# Patient Record
Sex: Female | Born: 1945
Health system: Southern US, Community
[De-identification: ages and names within clinical notes are randomized; demographics above are authoritative.]

## PROBLEM LIST (undated history)

## (undated) DIAGNOSIS — M1990 Unspecified osteoarthritis, unspecified site: Secondary | ICD-10-CM

## (undated) DIAGNOSIS — G8929 Other chronic pain: Secondary | ICD-10-CM

## (undated) DIAGNOSIS — Z860100 Personal history of colon polyps, unspecified: Secondary | ICD-10-CM

## (undated) DIAGNOSIS — T8859XA Other complications of anesthesia, initial encounter: Secondary | ICD-10-CM

## (undated) DIAGNOSIS — I10 Essential (primary) hypertension: Secondary | ICD-10-CM

## (undated) DIAGNOSIS — N200 Calculus of kidney: Secondary | ICD-10-CM

## (undated) DIAGNOSIS — R112 Nausea with vomiting, unspecified: Secondary | ICD-10-CM

## (undated) DIAGNOSIS — Z9889 Other specified postprocedural states: Secondary | ICD-10-CM

## (undated) DIAGNOSIS — F329 Major depressive disorder, single episode, unspecified: Secondary | ICD-10-CM

## (undated) DIAGNOSIS — Z87442 Personal history of urinary calculi: Secondary | ICD-10-CM

## (undated) DIAGNOSIS — R200 Anesthesia of skin: Secondary | ICD-10-CM

## (undated) DIAGNOSIS — H269 Unspecified cataract: Secondary | ICD-10-CM

## (undated) DIAGNOSIS — M19019 Primary osteoarthritis, unspecified shoulder: Secondary | ICD-10-CM

## (undated) DIAGNOSIS — E039 Hypothyroidism, unspecified: Secondary | ICD-10-CM

## (undated) DIAGNOSIS — T4145XA Adverse effect of unspecified anesthetic, initial encounter: Secondary | ICD-10-CM

## (undated) DIAGNOSIS — E785 Hyperlipidemia, unspecified: Secondary | ICD-10-CM

## (undated) DIAGNOSIS — M549 Dorsalgia, unspecified: Secondary | ICD-10-CM

## (undated) DIAGNOSIS — K5900 Constipation, unspecified: Secondary | ICD-10-CM

## (undated) DIAGNOSIS — F32A Depression, unspecified: Secondary | ICD-10-CM

## (undated) HISTORY — PX: BRONCHOSCOPY: SUR163

## (undated) HISTORY — PX: LUNG BIOPSY: SHX232

## (undated) HISTORY — DX: Calculus of kidney: N20.0

## (undated) HISTORY — DX: Unspecified osteoarthritis, unspecified site: M19.90

## (undated) HISTORY — PX: ABDOMINAL HYSTERECTOMY: SHX81

## (undated) HISTORY — PX: BACK SURGERY: SHX140

---

## 1983-02-22 HISTORY — PX: TUBAL LIGATION: SHX77

## 2002-04-23 HISTORY — PX: SHOULDER SURGERY: SHX246

## 2010-04-23 HISTORY — PX: ABDOMINAL HYSTERECTOMY: SHX81

## 2010-07-13 ENCOUNTER — Other Ambulatory Visit (HOSPITAL_COMMUNITY): Payer: Self-pay | Admitting: Urology

## 2010-07-13 ENCOUNTER — Other Ambulatory Visit: Payer: Self-pay | Admitting: Family Medicine

## 2010-07-13 DIAGNOSIS — K225 Diverticulum of esophagus, acquired: Secondary | ICD-10-CM

## 2010-07-13 DIAGNOSIS — N361 Urethral diverticulum: Secondary | ICD-10-CM

## 2010-07-18 ENCOUNTER — Ambulatory Visit (HOSPITAL_COMMUNITY)
Admission: RE | Admit: 2010-07-18 | Discharge: 2010-07-18 | Disposition: A | Discharge status: Home / Self Care | Payer: BC Managed Care – PPO | Source: Ambulatory Visit | Attending: Urology | Admitting: Urology

## 2010-07-18 DIAGNOSIS — IMO0002 Reserved for concepts with insufficient information to code with codable children: Secondary | ICD-10-CM | POA: Insufficient documentation

## 2010-07-18 DIAGNOSIS — N361 Urethral diverticulum: Secondary | ICD-10-CM

## 2011-01-24 ENCOUNTER — Ambulatory Visit (HOSPITAL_COMMUNITY): Admission: RE | Admit: 2011-01-24 | Payer: Self-pay | Source: Ambulatory Visit | Admitting: Obstetrics & Gynecology

## 2011-01-24 ENCOUNTER — Encounter (HOSPITAL_COMMUNITY): Admission: RE | Payer: Self-pay | Source: Ambulatory Visit

## 2011-01-24 SURGERY — HYSTERECTOMY, VAGINAL, LAPAROSCOPY-ASSISTED
Anesthesia: General

## 2011-09-27 ENCOUNTER — Other Ambulatory Visit: Payer: Self-pay | Admitting: Physician Assistant

## 2012-04-23 HISTORY — PX: COLONOSCOPY: SHX174

## 2013-04-01 DIAGNOSIS — I10 Essential (primary) hypertension: Secondary | ICD-10-CM | POA: Diagnosis not present

## 2013-04-01 DIAGNOSIS — E785 Hyperlipidemia, unspecified: Secondary | ICD-10-CM | POA: Diagnosis not present

## 2013-04-01 DIAGNOSIS — E559 Vitamin D deficiency, unspecified: Secondary | ICD-10-CM | POA: Diagnosis not present

## 2013-04-24 DIAGNOSIS — M545 Low back pain, unspecified: Secondary | ICD-10-CM | POA: Diagnosis not present

## 2013-04-24 DIAGNOSIS — M5137 Other intervertebral disc degeneration, lumbosacral region: Secondary | ICD-10-CM | POA: Diagnosis not present

## 2013-04-24 DIAGNOSIS — M79609 Pain in unspecified limb: Secondary | ICD-10-CM | POA: Diagnosis not present

## 2013-05-13 DIAGNOSIS — M4310 Spondylolisthesis, site unspecified: Secondary | ICD-10-CM | POA: Diagnosis not present

## 2013-05-13 DIAGNOSIS — G8929 Other chronic pain: Secondary | ICD-10-CM | POA: Diagnosis not present

## 2013-05-13 DIAGNOSIS — M5137 Other intervertebral disc degeneration, lumbosacral region: Secondary | ICD-10-CM | POA: Diagnosis not present

## 2013-05-13 DIAGNOSIS — M79609 Pain in unspecified limb: Secondary | ICD-10-CM | POA: Diagnosis not present

## 2013-05-26 DIAGNOSIS — J069 Acute upper respiratory infection, unspecified: Secondary | ICD-10-CM | POA: Diagnosis not present

## 2013-07-07 DIAGNOSIS — E039 Hypothyroidism, unspecified: Secondary | ICD-10-CM | POA: Diagnosis not present

## 2013-07-07 DIAGNOSIS — I10 Essential (primary) hypertension: Secondary | ICD-10-CM | POA: Diagnosis not present

## 2013-07-07 DIAGNOSIS — E038 Other specified hypothyroidism: Secondary | ICD-10-CM | POA: Diagnosis not present

## 2013-07-07 DIAGNOSIS — E559 Vitamin D deficiency, unspecified: Secondary | ICD-10-CM | POA: Diagnosis not present

## 2013-07-07 DIAGNOSIS — E785 Hyperlipidemia, unspecified: Secondary | ICD-10-CM | POA: Diagnosis not present

## 2013-09-15 DIAGNOSIS — R5381 Other malaise: Secondary | ICD-10-CM | POA: Diagnosis not present

## 2013-09-15 DIAGNOSIS — E039 Hypothyroidism, unspecified: Secondary | ICD-10-CM | POA: Diagnosis not present

## 2013-09-15 DIAGNOSIS — R5383 Other fatigue: Secondary | ICD-10-CM | POA: Diagnosis not present

## 2013-09-15 DIAGNOSIS — M2550 Pain in unspecified joint: Secondary | ICD-10-CM | POA: Diagnosis not present

## 2013-10-26 DIAGNOSIS — I10 Essential (primary) hypertension: Secondary | ICD-10-CM | POA: Diagnosis not present

## 2013-10-26 DIAGNOSIS — E559 Vitamin D deficiency, unspecified: Secondary | ICD-10-CM | POA: Diagnosis not present

## 2013-10-26 DIAGNOSIS — Z1231 Encounter for screening mammogram for malignant neoplasm of breast: Secondary | ICD-10-CM | POA: Diagnosis not present

## 2013-10-26 DIAGNOSIS — E038 Other specified hypothyroidism: Secondary | ICD-10-CM | POA: Diagnosis not present

## 2013-10-26 DIAGNOSIS — E785 Hyperlipidemia, unspecified: Secondary | ICD-10-CM | POA: Diagnosis not present

## 2013-10-26 DIAGNOSIS — Z79899 Other long term (current) drug therapy: Secondary | ICD-10-CM | POA: Diagnosis not present

## 2013-11-18 DIAGNOSIS — E039 Hypothyroidism, unspecified: Secondary | ICD-10-CM | POA: Diagnosis not present

## 2013-11-18 DIAGNOSIS — R0789 Other chest pain: Secondary | ICD-10-CM | POA: Diagnosis not present

## 2013-11-18 DIAGNOSIS — R079 Chest pain, unspecified: Secondary | ICD-10-CM | POA: Diagnosis not present

## 2013-11-18 DIAGNOSIS — E785 Hyperlipidemia, unspecified: Secondary | ICD-10-CM | POA: Diagnosis not present

## 2013-11-18 DIAGNOSIS — I10 Essential (primary) hypertension: Secondary | ICD-10-CM | POA: Diagnosis not present

## 2013-11-23 DIAGNOSIS — R1011 Right upper quadrant pain: Secondary | ICD-10-CM | POA: Diagnosis not present

## 2013-12-07 DIAGNOSIS — E785 Hyperlipidemia, unspecified: Secondary | ICD-10-CM | POA: Diagnosis not present

## 2013-12-07 DIAGNOSIS — I10 Essential (primary) hypertension: Secondary | ICD-10-CM | POA: Diagnosis not present

## 2013-12-07 DIAGNOSIS — R609 Edema, unspecified: Secondary | ICD-10-CM | POA: Diagnosis not present

## 2013-12-07 DIAGNOSIS — R079 Chest pain, unspecified: Secondary | ICD-10-CM | POA: Diagnosis not present

## 2013-12-16 DIAGNOSIS — Z1231 Encounter for screening mammogram for malignant neoplasm of breast: Secondary | ICD-10-CM | POA: Diagnosis not present

## 2014-02-03 DIAGNOSIS — I10 Essential (primary) hypertension: Secondary | ICD-10-CM | POA: Diagnosis not present

## 2014-02-03 DIAGNOSIS — E782 Mixed hyperlipidemia: Secondary | ICD-10-CM | POA: Diagnosis not present

## 2014-02-03 DIAGNOSIS — Z23 Encounter for immunization: Secondary | ICD-10-CM | POA: Diagnosis not present

## 2014-02-03 DIAGNOSIS — E559 Vitamin D deficiency, unspecified: Secondary | ICD-10-CM | POA: Diagnosis not present

## 2014-02-03 DIAGNOSIS — E039 Hypothyroidism, unspecified: Secondary | ICD-10-CM | POA: Diagnosis not present

## 2014-02-03 DIAGNOSIS — M2550 Pain in unspecified joint: Secondary | ICD-10-CM | POA: Diagnosis not present

## 2014-02-24 DIAGNOSIS — Z23 Encounter for immunization: Secondary | ICD-10-CM | POA: Diagnosis not present

## 2014-03-10 DIAGNOSIS — M545 Low back pain: Secondary | ICD-10-CM | POA: Diagnosis not present

## 2014-03-16 DIAGNOSIS — M5136 Other intervertebral disc degeneration, lumbar region: Secondary | ICD-10-CM | POA: Diagnosis not present

## 2014-03-16 DIAGNOSIS — L608 Other nail disorders: Secondary | ICD-10-CM | POA: Diagnosis not present

## 2014-03-16 DIAGNOSIS — M2550 Pain in unspecified joint: Secondary | ICD-10-CM | POA: Diagnosis not present

## 2014-03-16 DIAGNOSIS — M150 Primary generalized (osteo)arthritis: Secondary | ICD-10-CM | POA: Diagnosis not present

## 2014-04-08 DIAGNOSIS — M5136 Other intervertebral disc degeneration, lumbar region: Secondary | ICD-10-CM | POA: Diagnosis not present

## 2014-04-08 DIAGNOSIS — M2550 Pain in unspecified joint: Secondary | ICD-10-CM | POA: Diagnosis not present

## 2014-04-08 DIAGNOSIS — L608 Other nail disorders: Secondary | ICD-10-CM | POA: Diagnosis not present

## 2014-04-08 DIAGNOSIS — M150 Primary generalized (osteo)arthritis: Secondary | ICD-10-CM | POA: Diagnosis not present

## 2014-05-04 DIAGNOSIS — M19011 Primary osteoarthritis, right shoulder: Secondary | ICD-10-CM | POA: Diagnosis not present

## 2014-05-04 DIAGNOSIS — M25511 Pain in right shoulder: Secondary | ICD-10-CM | POA: Diagnosis not present

## 2014-05-05 DIAGNOSIS — S39012D Strain of muscle, fascia and tendon of lower back, subsequent encounter: Secondary | ICD-10-CM | POA: Diagnosis not present

## 2014-05-17 DIAGNOSIS — S39012D Strain of muscle, fascia and tendon of lower back, subsequent encounter: Secondary | ICD-10-CM | POA: Diagnosis not present

## 2014-05-19 DIAGNOSIS — S39012D Strain of muscle, fascia and tendon of lower back, subsequent encounter: Secondary | ICD-10-CM | POA: Diagnosis not present

## 2014-05-31 DIAGNOSIS — I10 Essential (primary) hypertension: Secondary | ICD-10-CM | POA: Diagnosis not present

## 2014-05-31 DIAGNOSIS — E559 Vitamin D deficiency, unspecified: Secondary | ICD-10-CM | POA: Diagnosis not present

## 2014-05-31 DIAGNOSIS — E039 Hypothyroidism, unspecified: Secondary | ICD-10-CM | POA: Diagnosis not present

## 2014-05-31 DIAGNOSIS — E782 Mixed hyperlipidemia: Secondary | ICD-10-CM | POA: Diagnosis not present

## 2014-06-01 DIAGNOSIS — E782 Mixed hyperlipidemia: Secondary | ICD-10-CM | POA: Diagnosis not present

## 2014-06-01 DIAGNOSIS — M150 Primary generalized (osteo)arthritis: Secondary | ICD-10-CM | POA: Diagnosis not present

## 2014-06-01 DIAGNOSIS — E039 Hypothyroidism, unspecified: Secondary | ICD-10-CM | POA: Diagnosis not present

## 2014-06-01 DIAGNOSIS — I10 Essential (primary) hypertension: Secondary | ICD-10-CM | POA: Diagnosis not present

## 2014-06-07 DIAGNOSIS — S39012D Strain of muscle, fascia and tendon of lower back, subsequent encounter: Secondary | ICD-10-CM | POA: Diagnosis not present

## 2014-06-09 DIAGNOSIS — S39012D Strain of muscle, fascia and tendon of lower back, subsequent encounter: Secondary | ICD-10-CM | POA: Diagnosis not present

## 2014-06-14 DIAGNOSIS — S39012D Strain of muscle, fascia and tendon of lower back, subsequent encounter: Secondary | ICD-10-CM | POA: Diagnosis not present

## 2014-08-06 DIAGNOSIS — M25512 Pain in left shoulder: Secondary | ICD-10-CM | POA: Diagnosis not present

## 2014-08-06 DIAGNOSIS — M19012 Primary osteoarthritis, left shoulder: Secondary | ICD-10-CM | POA: Diagnosis not present

## 2014-09-04 DIAGNOSIS — J029 Acute pharyngitis, unspecified: Secondary | ICD-10-CM | POA: Diagnosis not present

## 2014-09-04 DIAGNOSIS — R062 Wheezing: Secondary | ICD-10-CM | POA: Diagnosis not present

## 2014-09-04 DIAGNOSIS — J069 Acute upper respiratory infection, unspecified: Secondary | ICD-10-CM | POA: Diagnosis not present

## 2014-09-23 DIAGNOSIS — R1310 Dysphagia, unspecified: Secondary | ICD-10-CM | POA: Diagnosis not present

## 2014-09-23 DIAGNOSIS — R32 Unspecified urinary incontinence: Secondary | ICD-10-CM | POA: Diagnosis not present

## 2014-09-23 DIAGNOSIS — R252 Cramp and spasm: Secondary | ICD-10-CM | POA: Diagnosis not present

## 2014-09-23 DIAGNOSIS — Z780 Asymptomatic menopausal state: Secondary | ICD-10-CM | POA: Diagnosis not present

## 2014-09-23 DIAGNOSIS — E559 Vitamin D deficiency, unspecified: Secondary | ICD-10-CM | POA: Diagnosis not present

## 2014-09-23 DIAGNOSIS — R05 Cough: Secondary | ICD-10-CM | POA: Diagnosis not present

## 2014-09-23 DIAGNOSIS — N182 Chronic kidney disease, stage 2 (mild): Secondary | ICD-10-CM | POA: Diagnosis not present

## 2014-09-23 DIAGNOSIS — R232 Flushing: Secondary | ICD-10-CM | POA: Diagnosis not present

## 2014-09-23 DIAGNOSIS — I129 Hypertensive chronic kidney disease with stage 1 through stage 4 chronic kidney disease, or unspecified chronic kidney disease: Secondary | ICD-10-CM | POA: Diagnosis not present

## 2014-09-23 DIAGNOSIS — M2550 Pain in unspecified joint: Secondary | ICD-10-CM | POA: Diagnosis not present

## 2014-09-23 DIAGNOSIS — E039 Hypothyroidism, unspecified: Secondary | ICD-10-CM | POA: Diagnosis not present

## 2014-09-23 DIAGNOSIS — Z79899 Other long term (current) drug therapy: Secondary | ICD-10-CM | POA: Diagnosis not present

## 2014-09-23 DIAGNOSIS — E785 Hyperlipidemia, unspecified: Secondary | ICD-10-CM | POA: Diagnosis not present

## 2014-09-27 DIAGNOSIS — M25511 Pain in right shoulder: Secondary | ICD-10-CM | POA: Diagnosis not present

## 2014-09-27 DIAGNOSIS — M19011 Primary osteoarthritis, right shoulder: Secondary | ICD-10-CM | POA: Diagnosis not present

## 2014-10-12 DIAGNOSIS — R1310 Dysphagia, unspecified: Secondary | ICD-10-CM | POA: Diagnosis not present

## 2014-10-20 ENCOUNTER — Ambulatory Visit: Payer: Self-pay | Admitting: Podiatry

## 2014-10-20 ENCOUNTER — Ambulatory Visit (INDEPENDENT_AMBULATORY_CARE_PROVIDER_SITE_OTHER): Payer: Medicare Other | Admitting: Podiatry

## 2014-10-20 ENCOUNTER — Encounter: Payer: Self-pay | Admitting: Podiatry

## 2014-10-20 VITALS — BP 140/88 | HR 76 | Resp 18

## 2014-10-20 DIAGNOSIS — M79676 Pain in unspecified toe(s): Secondary | ICD-10-CM

## 2014-10-20 DIAGNOSIS — B351 Tinea unguium: Secondary | ICD-10-CM | POA: Diagnosis not present

## 2014-10-20 DIAGNOSIS — M79674 Pain in right toe(s): Secondary | ICD-10-CM | POA: Diagnosis not present

## 2014-10-20 DIAGNOSIS — M79675 Pain in left toe(s): Secondary | ICD-10-CM

## 2014-10-20 NOTE — Progress Notes (Signed)
   Subjective:    Patient ID: [name], female    DOB: Apr 13, 1946, 69 y.o.   MRN: [id number]  HPI I HAVE AN INGROWN TOENAIL ON MY RIGHT BIG TOE AND IT IS SORE AND TENDER AND HAS TROUBLE CUTTING IT AND IS PAINFUL WHEN IT FLARES UP AND THERE IS NO DRAINING AND MY FEET HAVE ARTHRITIS IN THEM AND FEELS LIKE A BONE IS BROKEN IN THEM This patient presents saying she has pain on both borders right big toe.  She says she experiences pain walking and wearing her shoes.She says no evidence of redness or drainage.  She presents for evaluation and treatment.   Review of Systems  Constitutional:       SWEATING   Musculoskeletal: Positive for back pain and gait problem.       JOINT PAIN  All other systems reviewed and are negative.      Objective:   Physical Exam Objective: Review of past medical history, medications, social history and allergies were performed.  Vascular: Dorsalis pedis and posterior tibial pulses were palpable B/L, capillary refill was  WNL B/L, temperature gradient was WNL B/L   Skin:  No signs of symptoms of infection or ulcers on both feet  Nails: Thick disfigure discolored great nails both feet.  Pincer toenails noted.  Sensory: Thornell Mule monifilament WNL   Orthopedic: Orthopedic evaluation demonstrates all joints distal t ankle have full ROM without crepitus, muscle power WNL B/L.  Severe HAV deformity both feet with overlapping second toe both feet.       Assessment & Plan:  Onychomycosis Hallux B/L  IE   Debridement of hallux nails both feet

## 2015-01-04 DIAGNOSIS — M25512 Pain in left shoulder: Secondary | ICD-10-CM | POA: Diagnosis not present

## 2015-01-04 DIAGNOSIS — M19012 Primary osteoarthritis, left shoulder: Secondary | ICD-10-CM | POA: Diagnosis not present

## 2015-01-06 DIAGNOSIS — E6609 Other obesity due to excess calories: Secondary | ICD-10-CM | POA: Diagnosis not present

## 2015-01-06 DIAGNOSIS — Z0000 Encounter for general adult medical examination without abnormal findings: Secondary | ICD-10-CM | POA: Diagnosis not present

## 2015-01-06 DIAGNOSIS — E559 Vitamin D deficiency, unspecified: Secondary | ICD-10-CM | POA: Diagnosis not present

## 2015-01-06 DIAGNOSIS — R5383 Other fatigue: Secondary | ICD-10-CM | POA: Diagnosis not present

## 2015-01-06 DIAGNOSIS — Z1231 Encounter for screening mammogram for malignant neoplasm of breast: Secondary | ICD-10-CM | POA: Diagnosis not present

## 2015-01-06 DIAGNOSIS — Z136 Encounter for screening for cardiovascular disorders: Secondary | ICD-10-CM | POA: Diagnosis not present

## 2015-01-06 DIAGNOSIS — N182 Chronic kidney disease, stage 2 (mild): Secondary | ICD-10-CM | POA: Diagnosis not present

## 2015-01-06 DIAGNOSIS — E785 Hyperlipidemia, unspecified: Secondary | ICD-10-CM | POA: Diagnosis not present

## 2015-01-06 DIAGNOSIS — I129 Hypertensive chronic kidney disease with stage 1 through stage 4 chronic kidney disease, or unspecified chronic kidney disease: Secondary | ICD-10-CM | POA: Diagnosis not present

## 2015-01-06 DIAGNOSIS — M2550 Pain in unspecified joint: Secondary | ICD-10-CM | POA: Diagnosis not present

## 2015-01-06 DIAGNOSIS — Z1211 Encounter for screening for malignant neoplasm of colon: Secondary | ICD-10-CM | POA: Diagnosis not present

## 2015-01-06 DIAGNOSIS — Z79899 Other long term (current) drug therapy: Secondary | ICD-10-CM | POA: Diagnosis not present

## 2015-01-06 DIAGNOSIS — Z6830 Body mass index (BMI) 30.0-30.9, adult: Secondary | ICD-10-CM | POA: Diagnosis not present

## 2015-01-18 DIAGNOSIS — R32 Unspecified urinary incontinence: Secondary | ICD-10-CM | POA: Diagnosis not present

## 2015-01-18 DIAGNOSIS — R232 Flushing: Secondary | ICD-10-CM | POA: Diagnosis not present

## 2015-01-18 DIAGNOSIS — Z23 Encounter for immunization: Secondary | ICD-10-CM | POA: Diagnosis not present

## 2015-01-18 DIAGNOSIS — N368 Other specified disorders of urethra: Secondary | ICD-10-CM | POA: Diagnosis not present

## 2015-01-18 DIAGNOSIS — Z780 Asymptomatic menopausal state: Secondary | ICD-10-CM | POA: Diagnosis not present

## 2015-01-18 DIAGNOSIS — D729 Disorder of white blood cells, unspecified: Secondary | ICD-10-CM | POA: Diagnosis not present

## 2015-01-25 DIAGNOSIS — Z1211 Encounter for screening for malignant neoplasm of colon: Secondary | ICD-10-CM | POA: Diagnosis not present

## 2015-02-02 DIAGNOSIS — Z1231 Encounter for screening mammogram for malignant neoplasm of breast: Secondary | ICD-10-CM | POA: Diagnosis not present

## 2015-02-02 DIAGNOSIS — Z13820 Encounter for screening for osteoporosis: Secondary | ICD-10-CM | POA: Diagnosis not present

## 2015-02-02 DIAGNOSIS — Z780 Asymptomatic menopausal state: Secondary | ICD-10-CM | POA: Diagnosis not present

## 2015-02-07 DIAGNOSIS — R3989 Other symptoms and signs involving the genitourinary system: Secondary | ICD-10-CM | POA: Diagnosis not present

## 2015-02-07 DIAGNOSIS — R102 Pelvic and perineal pain: Secondary | ICD-10-CM | POA: Diagnosis not present

## 2015-02-07 DIAGNOSIS — N952 Postmenopausal atrophic vaginitis: Secondary | ICD-10-CM | POA: Diagnosis not present

## 2015-02-08 DIAGNOSIS — N182 Chronic kidney disease, stage 2 (mild): Secondary | ICD-10-CM | POA: Diagnosis not present

## 2015-02-08 DIAGNOSIS — I129 Hypertensive chronic kidney disease with stage 1 through stage 4 chronic kidney disease, or unspecified chronic kidney disease: Secondary | ICD-10-CM | POA: Diagnosis not present

## 2015-02-08 DIAGNOSIS — Z79899 Other long term (current) drug therapy: Secondary | ICD-10-CM | POA: Diagnosis not present

## 2015-02-28 DIAGNOSIS — R3989 Other symptoms and signs involving the genitourinary system: Secondary | ICD-10-CM | POA: Diagnosis not present

## 2015-02-28 DIAGNOSIS — N952 Postmenopausal atrophic vaginitis: Secondary | ICD-10-CM | POA: Diagnosis not present

## 2015-03-07 DIAGNOSIS — M19011 Primary osteoarthritis, right shoulder: Secondary | ICD-10-CM | POA: Diagnosis not present

## 2015-03-07 DIAGNOSIS — M7540 Impingement syndrome of unspecified shoulder: Secondary | ICD-10-CM | POA: Diagnosis not present

## 2015-06-13 DIAGNOSIS — M19011 Primary osteoarthritis, right shoulder: Secondary | ICD-10-CM | POA: Diagnosis not present

## 2015-06-14 DIAGNOSIS — M5090 Cervical disc disorder, unspecified, unspecified cervical region: Secondary | ICD-10-CM | POA: Diagnosis not present

## 2015-06-14 DIAGNOSIS — M545 Low back pain: Secondary | ICD-10-CM | POA: Diagnosis not present

## 2015-06-20 DIAGNOSIS — M7581 Other shoulder lesions, right shoulder: Secondary | ICD-10-CM | POA: Diagnosis not present

## 2015-06-20 DIAGNOSIS — M50221 Other cervical disc displacement at C4-C5 level: Secondary | ICD-10-CM | POA: Diagnosis not present

## 2015-06-20 DIAGNOSIS — M5030 Other cervical disc degeneration, unspecified cervical region: Secondary | ICD-10-CM | POA: Diagnosis not present

## 2015-06-20 DIAGNOSIS — M50222 Other cervical disc displacement at C5-C6 level: Secondary | ICD-10-CM | POA: Diagnosis not present

## 2015-06-20 DIAGNOSIS — M47892 Other spondylosis, cervical region: Secondary | ICD-10-CM | POA: Diagnosis not present

## 2015-06-20 DIAGNOSIS — M47812 Spondylosis without myelopathy or radiculopathy, cervical region: Secondary | ICD-10-CM | POA: Diagnosis not present

## 2015-06-20 DIAGNOSIS — M4802 Spinal stenosis, cervical region: Secondary | ICD-10-CM | POA: Diagnosis not present

## 2015-06-20 DIAGNOSIS — M19011 Primary osteoarthritis, right shoulder: Secondary | ICD-10-CM | POA: Diagnosis not present

## 2015-06-21 DIAGNOSIS — M5090 Cervical disc disorder, unspecified, unspecified cervical region: Secondary | ICD-10-CM | POA: Diagnosis not present

## 2015-08-17 DIAGNOSIS — M546 Pain in thoracic spine: Secondary | ICD-10-CM | POA: Diagnosis not present

## 2015-08-25 DIAGNOSIS — E785 Hyperlipidemia, unspecified: Secondary | ICD-10-CM | POA: Diagnosis not present

## 2015-08-25 DIAGNOSIS — Z1389 Encounter for screening for other disorder: Secondary | ICD-10-CM | POA: Diagnosis not present

## 2015-08-25 DIAGNOSIS — I10 Essential (primary) hypertension: Secondary | ICD-10-CM | POA: Diagnosis not present

## 2015-08-25 DIAGNOSIS — E039 Hypothyroidism, unspecified: Secondary | ICD-10-CM | POA: Diagnosis not present

## 2015-08-25 DIAGNOSIS — Z780 Asymptomatic menopausal state: Secondary | ICD-10-CM | POA: Diagnosis not present

## 2015-08-25 DIAGNOSIS — N941 Unspecified dyspareunia: Secondary | ICD-10-CM | POA: Diagnosis not present

## 2015-08-25 DIAGNOSIS — M1990 Unspecified osteoarthritis, unspecified site: Secondary | ICD-10-CM | POA: Diagnosis not present

## 2015-08-25 DIAGNOSIS — E559 Vitamin D deficiency, unspecified: Secondary | ICD-10-CM | POA: Diagnosis not present

## 2015-08-25 DIAGNOSIS — E669 Obesity, unspecified: Secondary | ICD-10-CM | POA: Diagnosis not present

## 2015-08-25 DIAGNOSIS — Z9181 History of falling: Secondary | ICD-10-CM | POA: Diagnosis not present

## 2015-08-25 DIAGNOSIS — R1310 Dysphagia, unspecified: Secondary | ICD-10-CM | POA: Diagnosis not present

## 2015-11-22 DIAGNOSIS — M5137 Other intervertebral disc degeneration, lumbosacral region: Secondary | ICD-10-CM | POA: Diagnosis not present

## 2015-11-22 DIAGNOSIS — N816 Rectocele: Secondary | ICD-10-CM | POA: Diagnosis not present

## 2015-11-22 DIAGNOSIS — N9419 Other specified dyspareunia: Secondary | ICD-10-CM | POA: Diagnosis not present

## 2015-11-22 DIAGNOSIS — N3946 Mixed incontinence: Secondary | ICD-10-CM | POA: Diagnosis not present

## 2015-11-22 DIAGNOSIS — N8111 Cystocele, midline: Secondary | ICD-10-CM | POA: Diagnosis not present

## 2015-11-24 DIAGNOSIS — Z9851 Tubal ligation status: Secondary | ICD-10-CM | POA: Diagnosis not present

## 2015-11-24 DIAGNOSIS — N993 Prolapse of vaginal vault after hysterectomy: Secondary | ICD-10-CM | POA: Diagnosis not present

## 2015-11-24 DIAGNOSIS — E785 Hyperlipidemia, unspecified: Secondary | ICD-10-CM | POA: Diagnosis not present

## 2015-11-24 DIAGNOSIS — N816 Rectocele: Secondary | ICD-10-CM | POA: Diagnosis not present

## 2015-11-24 DIAGNOSIS — K5900 Constipation, unspecified: Secondary | ICD-10-CM | POA: Diagnosis not present

## 2015-11-24 DIAGNOSIS — Z90710 Acquired absence of both cervix and uterus: Secondary | ICD-10-CM | POA: Diagnosis not present

## 2015-11-24 DIAGNOSIS — N9419 Other specified dyspareunia: Secondary | ICD-10-CM | POA: Diagnosis not present

## 2015-11-24 DIAGNOSIS — N952 Postmenopausal atrophic vaginitis: Secondary | ICD-10-CM | POA: Diagnosis not present

## 2015-11-24 DIAGNOSIS — E039 Hypothyroidism, unspecified: Secondary | ICD-10-CM | POA: Diagnosis not present

## 2015-11-24 DIAGNOSIS — I10 Essential (primary) hypertension: Secondary | ICD-10-CM | POA: Diagnosis not present

## 2015-12-21 DIAGNOSIS — M19011 Primary osteoarthritis, right shoulder: Secondary | ICD-10-CM | POA: Diagnosis not present

## 2015-12-21 DIAGNOSIS — M19012 Primary osteoarthritis, left shoulder: Secondary | ICD-10-CM | POA: Diagnosis not present

## 2015-12-23 DIAGNOSIS — Z87442 Personal history of urinary calculi: Secondary | ICD-10-CM

## 2015-12-23 DIAGNOSIS — M65311 Trigger thumb, right thumb: Secondary | ICD-10-CM | POA: Diagnosis not present

## 2015-12-23 HISTORY — DX: Personal history of urinary calculi: Z87.442

## 2015-12-28 DIAGNOSIS — N201 Calculus of ureter: Secondary | ICD-10-CM | POA: Diagnosis not present

## 2015-12-28 DIAGNOSIS — N132 Hydronephrosis with renal and ureteral calculous obstruction: Secondary | ICD-10-CM | POA: Diagnosis not present

## 2015-12-28 DIAGNOSIS — E876 Hypokalemia: Secondary | ICD-10-CM | POA: Diagnosis not present

## 2015-12-28 DIAGNOSIS — N202 Calculus of kidney with calculus of ureter: Secondary | ICD-10-CM | POA: Diagnosis not present

## 2016-01-05 DIAGNOSIS — N200 Calculus of kidney: Secondary | ICD-10-CM | POA: Diagnosis not present

## 2016-01-05 DIAGNOSIS — R1032 Left lower quadrant pain: Secondary | ICD-10-CM | POA: Diagnosis not present

## 2016-01-10 DIAGNOSIS — Z6831 Body mass index (BMI) 31.0-31.9, adult: Secondary | ICD-10-CM | POA: Diagnosis not present

## 2016-01-10 DIAGNOSIS — Z0000 Encounter for general adult medical examination without abnormal findings: Secondary | ICD-10-CM | POA: Diagnosis not present

## 2016-01-10 DIAGNOSIS — N202 Calculus of kidney with calculus of ureter: Secondary | ICD-10-CM | POA: Diagnosis not present

## 2016-01-10 DIAGNOSIS — E559 Vitamin D deficiency, unspecified: Secondary | ICD-10-CM | POA: Diagnosis not present

## 2016-01-10 DIAGNOSIS — Z23 Encounter for immunization: Secondary | ICD-10-CM | POA: Diagnosis not present

## 2016-01-10 DIAGNOSIS — N200 Calculus of kidney: Secondary | ICD-10-CM | POA: Diagnosis not present

## 2016-01-10 DIAGNOSIS — E039 Hypothyroidism, unspecified: Secondary | ICD-10-CM | POA: Diagnosis not present

## 2016-01-10 DIAGNOSIS — Z1389 Encounter for screening for other disorder: Secondary | ICD-10-CM | POA: Diagnosis not present

## 2016-01-10 DIAGNOSIS — E785 Hyperlipidemia, unspecified: Secondary | ICD-10-CM | POA: Diagnosis not present

## 2016-01-10 DIAGNOSIS — E669 Obesity, unspecified: Secondary | ICD-10-CM | POA: Diagnosis not present

## 2016-01-10 DIAGNOSIS — I10 Essential (primary) hypertension: Secondary | ICD-10-CM | POA: Diagnosis not present

## 2016-01-11 ENCOUNTER — Encounter (HOSPITAL_COMMUNITY)
Admission: RE | Admit: 2016-01-11 | Discharge: 2016-01-11 | Disposition: A | Discharge status: Home / Self Care | Payer: Medicare Other | Source: Ambulatory Visit | Attending: Orthopedic Surgery | Admitting: Orthopedic Surgery

## 2016-01-11 ENCOUNTER — Encounter (HOSPITAL_COMMUNITY): Payer: Self-pay

## 2016-01-11 ENCOUNTER — Other Ambulatory Visit (HOSPITAL_COMMUNITY): Payer: Self-pay | Admitting: *Deleted

## 2016-01-11 DIAGNOSIS — Z01812 Encounter for preprocedural laboratory examination: Secondary | ICD-10-CM | POA: Insufficient documentation

## 2016-01-11 DIAGNOSIS — I10 Essential (primary) hypertension: Secondary | ICD-10-CM | POA: Diagnosis not present

## 2016-01-11 HISTORY — DX: Essential (primary) hypertension: I10

## 2016-01-11 HISTORY — DX: Unspecified osteoarthritis, unspecified site: M19.90

## 2016-01-11 HISTORY — DX: Other complications of anesthesia, initial encounter: T88.59XA

## 2016-01-11 HISTORY — DX: Hypothyroidism, unspecified: E03.9

## 2016-01-11 HISTORY — DX: Adverse effect of unspecified anesthetic, initial encounter: T41.45XA

## 2016-01-11 LAB — SURGICAL PCR SCREEN
MRSA, PCR: NEGATIVE
Staphylococcus aureus: NEGATIVE

## 2016-01-11 NOTE — Progress Notes (Signed)
Labs done 01/10/16 at pcp dr brad [facility] [phone]. req'd echo, stress notes ekg req'd from [place] dr Bettina Gavia [place] [phone]

## 2016-01-11 NOTE — Pre-Procedure Instructions (Addendum)
[name]  01/11/2016      [facility] [place], [place] [address] [place] [address] Phone: [phone] Fax: [phone]    Your procedure is scheduled on Thursday, January 19, 2016    Report to [facility] Entrance "A" Admitting Office at 5:30 AM.   Call this number if you have problems the morning of surgery: [phone]   Questions prior to day of surgery, please call [phone] between 8 & 4 PM.   Remember:  Do not eat food or drink liquids after midnight Wednesday, 01/18/16.  Take these medicines the morning of surgery with A SIP OF WATER: Levothyroxine (Synthroid), Oxycodone - if needed  Stop NSAIDS (Ibuprofen, Aleve, etc.) as of today. Do not use Aspirin products prior to surgery.also stop all vitamins,fish oil   Do not wear jewelry, make-up or nail polish.  Do not wear lotions, powders, or perfumes, or deodorant.  Do not shave 48 hours prior to surgery.    Do not bring valuables to the hospital.  [facility] is not responsible for any belongings or valuables.  Contacts, dentures or bridgework may not be worn into surgery.  Leave your suitcase in the car.  After surgery it may be brought to your room.  For patients admitted to the hospital, discharge time will be determined by your treatment team.  Special instructions:  [facility] - Preparing for Surgery  Before surgery, you can play an important role.  Because skin is not sterile, your skin needs to be as free of germs as possible.  You can reduce the number of germs on you skin by washing with CHG (chlorahexidine gluconate) soap before surgery.  CHG is an antiseptic cleaner which kills germs and bonds with the skin to continue killing germs even after washing.  Please DO NOT use if you have an allergy to CHG or antibacterial soaps.  If your skin becomes reddened/irritated stop using the CHG and inform  your nurse when you arrive at Short Stay.  Do not shave (including legs and underarms) for at least 48 hours prior to the first CHG shower.  You may shave your face.  Please follow these instructions carefully:   1.  Shower with CHG Soap the night before surgery and the                    morning of Surgery.  2.  If you choose to wash your hair, wash your hair first as usual with your       normal shampoo.  3.  After you shampoo, rinse your hair and body thoroughly to remove the shampoo.  4.  Use CHG as you would any other liquid soap.  You can apply chg directly       to the skin and wash gently with scrungie or a clean washcloth.  5.  Apply the CHG Soap to your body ONLY FROM THE NECK DOWN.        Do not use on open wounds or open sores.  Avoid contact with your eyes, ears, mouth and genitals (private parts).  Wash genitals (private parts) with your normal soap.  6.  Wash thoroughly, paying special attention to the area where your surgery        will be performed.  7.  Thoroughly rinse your body with warm water from the neck down.  8.  DO NOT shower/wash with your normal soap after using and rinsing off       the CHG Soap.  9.  Pat yourself dry with a clean towel.            10.  Wear clean pajamas.            11.  Place clean sheets on your bed the night of your first shower and do not        sleep with pets.  Day of Surgery  Do not apply any lotions/deodorants the morning of surgery.  Please wear clean clothes to the hospital.   Please read over the  fact sheets that you were given.

## 2016-01-12 NOTE — Progress Notes (Addendum)
Anesthesia Chart Review: Patient is a 70 year old female scheduled for left total shoulder arthroplasty on 01/19/16 by Dr. Onnie Graham.  History includes non-smoker, HTN, hypothyroidism, hysterectomy, back surgery, arthritis. For anesthesia history, she reported nausea and dizziness after previous surgery that required her to stay overnight.  PCP is Dr. Charletta Cousin with [facility]. She reported a negative stress test (possibly echo as well) at least four years ago at cardiologist's Dr. Joya Gaskins office in [place]. (Dr. Bettina Gavia is now with [facility], but was with [facility] at that time. There are no cardiology records for this patient at Dr. Joya Gaskins current office, but I did receive a stress echo from [place]. See below.)  BP 134/88   Pulse 80   Temp 36.6 C   Resp 20   Ht '5\' 4"'$  (1.626 m)   Wt 179 lb 3.2 oz (81.3 kg)   SpO2 96%   BMI 30.76 kg/m   Meds include levothyroxine, losartan, omega 3 fatty acids, Oxy IR, pravastatin, promethazine, tamsulosin.  01/11/16 EKG: NSR.  11/18/13 Stress echo [facility]): Conclusion: 1. This was a technically adequate study. 2. Utilizing a standard Bruce protocol the patient was exercised for 5 minutes, 0 seconds, achieving a maximum heart rate of 144 bpm, which is 93% of predicted maximal heart rate. There was physiologic heart rate and blood pressure response to exercise. 3. This level of exercise represents a fair exercise tolerance for age. 4. Echo images were acquired at peak stress which demonstrated appropriate augmentation of all wall ventricular segments with slight decrease in cavity size. No wall motion abnormalities noted. Peak EF 70%.  Patient reported recent labs drawn at Dr. Marcello Moores' office on 01/10/16. Labs requested and will follow-up once received.  [facility] Stay Center/Anesthesiology Phone [phone] 01/12/2016 5:01 PM  Addendum: 01/10/16 labs  received from Dr. Marcello Moores. CMET showed glucose 125, BUN 18, Cr 0.92, K 4.4, AST 20, ALT 16CBC showed WBC 10.8, H/H 13.7/41.3, PLT 379. UA was cloudy but otherwise negative. TSH 1.210.  (COPIES OF LABS ARE ON HER HARD CHART.)  If no acute changes then I anticipate that she can proceed as planned.  [facility] Short Stay Center/Anesthesiology Phone [phone] 01/13/2016 10:09 AM

## 2016-01-15 DIAGNOSIS — Z1211 Encounter for screening for malignant neoplasm of colon: Secondary | ICD-10-CM | POA: Diagnosis not present

## 2016-01-16 DIAGNOSIS — I10 Essential (primary) hypertension: Secondary | ICD-10-CM | POA: Diagnosis not present

## 2016-01-16 DIAGNOSIS — Z780 Asymptomatic menopausal state: Secondary | ICD-10-CM | POA: Diagnosis not present

## 2016-01-16 DIAGNOSIS — E039 Hypothyroidism, unspecified: Secondary | ICD-10-CM | POA: Diagnosis not present

## 2016-01-16 DIAGNOSIS — R1310 Dysphagia, unspecified: Secondary | ICD-10-CM | POA: Diagnosis not present

## 2016-01-16 DIAGNOSIS — Z79899 Other long term (current) drug therapy: Secondary | ICD-10-CM | POA: Diagnosis not present

## 2016-01-16 DIAGNOSIS — E785 Hyperlipidemia, unspecified: Secondary | ICD-10-CM | POA: Diagnosis not present

## 2016-01-16 DIAGNOSIS — E559 Vitamin D deficiency, unspecified: Secondary | ICD-10-CM | POA: Diagnosis not present

## 2016-01-16 DIAGNOSIS — M1990 Unspecified osteoarthritis, unspecified site: Secondary | ICD-10-CM | POA: Diagnosis not present

## 2016-01-18 MED ORDER — TRANEXAMIC ACID 1000 MG/10ML IV SOLN
1000.0000 mg | INTRAVENOUS | Status: AC
  Administered 2016-01-19: 1000 mg via INTRAVENOUS
  Filled 2016-01-18: qty 10

## 2016-01-18 NOTE — Anesthesia Preprocedure Evaluation (Addendum)
Anesthesia Evaluation  Patient identified by MRN, date of birth, ID band Patient awake    Reviewed: Allergy & Precautions, NPO status , Patient's Chart, lab work & pertinent test results  History of Anesthesia Complications (+) PONV and history of anesthetic complications  Airway Mallampati: II  TM Distance: >3 FB Neck ROM: Full    Dental  (+) Dental Advisory Given,    Pulmonary neg pulmonary ROS,    Pulmonary exam normal breath sounds clear to auscultation       Cardiovascular Exercise Tolerance: Good hypertension, Pt. on medications (-) angina(-) Past MI, (-) Cardiac Stents, (-) CABG and (-) Orthopnea  Rhythm:Regular Rate:Normal  HLD  01/11/16 EKG: NSR.  11/18/13 Stress echo [facility]): Conclusion: 1. This was a technically adequate study. 2. Utilizing a standard Bruce protocol the patient was exercised for 5 minutes, 0 seconds, achieving a maximum heart rate of 144 bpm, which is 93% of predicted maximal heart rate. There was physiologic heart rate and blood pressure response to exercise. 3. This level of exercise represents a fair exercise tolerance for age. 4. Echo images were acquired at peak stress which demonstrated appropriate augmentation of all wall ventricular segments with slight decrease in cavity size. No wall motion abnormalities noted. Peak EF 70%.   Neuro/Psych negative neurological ROS     GI/Hepatic negative GI ROS, Neg liver ROS,   Endo/Other  neg diabetesHypothyroidism   Renal/GU negative Renal ROS     Musculoskeletal  (+) Arthritis ,   Abdominal   Peds  Hematology negative hematology ROS (+)   Anesthesia Other Findings   Reproductive/Obstetrics                            Anesthesia Physical Anesthesia Plan  ASA: II  Anesthesia Plan: General and Regional   Post-op Pain Management: GA combined w/ Regional for post-op pain   Induction:  Intravenous  Airway Management Planned: Oral ETT  Additional Equipment:   Intra-op Plan:   Post-operative Plan: Extubation in OR  Informed Consent: I have reviewed the patients History and Physical, chart, labs and discussed the procedure including the risks, benefits and alternatives for the proposed anesthesia with the patient or authorized representative who has indicated his/her understanding and acceptance.   Dental advisory given  Plan Discussed with: CRNA  Anesthesia Plan Comments: (Patient states she had memory problems after her hysterectomy. She requests as little anesthesia as possible.  Risks of general anesthesia discussed including, but not limited to, sore throat, hoarse voice, chipped/damaged teeth, injury to vocal cords, nausea and vomiting, allergic reactions, lung infection, heart attack, stroke, and death. All questions answered.  Discussed potential risks of nerve blocks including, but not limited to, infection, bleeding, nerve damage, seizures, pneumothorax, respiratory depression, and potential failure of the block. Alternatives to nerve blocks discussed. All questions answered. )       Anesthesia Quick Evaluation

## 2016-01-19 ENCOUNTER — Inpatient Hospital Stay (HOSPITAL_COMMUNITY): Payer: Medicare Other | Admitting: Anesthesiology

## 2016-01-19 ENCOUNTER — Encounter (HOSPITAL_COMMUNITY): Payer: Self-pay | Admitting: *Deleted

## 2016-01-19 ENCOUNTER — Inpatient Hospital Stay (HOSPITAL_COMMUNITY)
Admission: RE | Admit: 2016-01-19 | Discharge: 2016-01-20 | DRG: 483 | Disposition: A | Discharge status: Home / Self Care | Payer: Medicare Other | Source: Ambulatory Visit | Attending: Orthopedic Surgery | Admitting: Orthopedic Surgery

## 2016-01-19 ENCOUNTER — Encounter (HOSPITAL_COMMUNITY)
Admission: RE | Disposition: A | Discharge status: Home / Self Care | Payer: Self-pay | Source: Ambulatory Visit | Attending: Orthopedic Surgery

## 2016-01-19 ENCOUNTER — Inpatient Hospital Stay (HOSPITAL_COMMUNITY): Payer: Medicare Other | Admitting: Vascular Surgery

## 2016-01-19 DIAGNOSIS — M19012 Primary osteoarthritis, left shoulder: Principal | ICD-10-CM | POA: Diagnosis present

## 2016-01-19 DIAGNOSIS — E039 Hypothyroidism, unspecified: Secondary | ICD-10-CM | POA: Diagnosis present

## 2016-01-19 DIAGNOSIS — Z79899 Other long term (current) drug therapy: Secondary | ICD-10-CM | POA: Diagnosis not present

## 2016-01-19 DIAGNOSIS — I10 Essential (primary) hypertension: Secondary | ICD-10-CM | POA: Diagnosis present

## 2016-01-19 DIAGNOSIS — G8918 Other acute postprocedural pain: Secondary | ICD-10-CM | POA: Diagnosis not present

## 2016-01-19 DIAGNOSIS — Z87442 Personal history of urinary calculi: Secondary | ICD-10-CM

## 2016-01-19 DIAGNOSIS — Z96619 Presence of unspecified artificial shoulder joint: Secondary | ICD-10-CM

## 2016-01-19 HISTORY — PX: TOTAL SHOULDER ARTHROPLASTY: SHX126

## 2016-01-19 LAB — BASIC METABOLIC PANEL
ANION GAP: 8 (ref 5–15)
BUN: 22 mg/dL — AB (ref 6–20)
CHLORIDE: 108 mmol/L (ref 101–111)
CO2: 24 mmol/L (ref 22–32)
Calcium: 9.7 mg/dL (ref 8.9–10.3)
Creatinine, Ser: 0.79 mg/dL (ref 0.44–1.00)
GFR calc Af Amer: >60 mL/min (ref >60)
Glucose, Bld: 94 mg/dL (ref 65–99)
POTASSIUM: 4 mmol/L (ref 3.5–5.1)
SODIUM: 140 mmol/L (ref 135–145)

## 2016-01-19 LAB — CBC
HEMATOCRIT: 42 % (ref 36.0–46.0)
HEMOGLOBIN: 13.6 g/dL (ref 12.0–15.0)
MCH: 31.3 pg (ref 26.0–34.0)
MCHC: 32.4 g/dL (ref 30.0–36.0)
MCV: 96.6 fL (ref 78.0–100.0)
Platelets: 317 10*3/uL (ref 150–400)
RBC: 4.35 MIL/uL (ref 3.87–5.11)
RDW: 13.5 % (ref 11.5–15.5)
WBC: 8.1 10*3/uL (ref 4.0–10.5)

## 2016-01-19 SURGERY — ARTHROPLASTY, SHOULDER, TOTAL
Anesthesia: Regional | Site: Shoulder | Laterality: Left

## 2016-01-19 MED ORDER — ONDANSETRON HCL 4 MG/2ML IJ SOLN
INTRAMUSCULAR | Status: DC | PRN
  Administered 2016-01-19: 4 mg via INTRAVENOUS

## 2016-01-19 MED ORDER — CEFAZOLIN SODIUM-DEXTROSE 2-4 GM/100ML-% IV SOLN
2.0000 g | Freq: Four times a day (QID) | INTRAVENOUS | Status: AC
  Administered 2016-01-19 – 2016-01-20 (×3): 2 g via INTRAVENOUS
  Filled 2016-01-19 (×3): qty 100

## 2016-01-19 MED ORDER — GLYCOPYRROLATE 0.2 MG/ML IV SOSY
PREFILLED_SYRINGE | INTRAVENOUS | Status: AC
  Filled 2016-01-19: qty 3

## 2016-01-19 MED ORDER — ONDANSETRON HCL 4 MG/2ML IJ SOLN: 4.0000 mg | Freq: Four times a day (QID) | INTRAMUSCULAR | Status: DC | PRN

## 2016-01-19 MED ORDER — PROMETHAZINE HCL 25 MG/ML IJ SOLN: 6.2500 mg | INTRAMUSCULAR | Status: DC | PRN

## 2016-01-19 MED ORDER — SUCCINYLCHOLINE CHLORIDE 20 MG/ML IJ SOLN
INTRAMUSCULAR | Status: DC | PRN
  Administered 2016-01-19: 100 mg via INTRAVENOUS

## 2016-01-19 MED ORDER — PROPOFOL 10 MG/ML IV BOLUS
INTRAVENOUS | Status: AC
  Filled 2016-01-19: qty 20

## 2016-01-19 MED ORDER — METOCLOPRAMIDE HCL 5 MG PO TABS
5.0000 mg | ORAL_TABLET | Freq: Three times a day (TID) | ORAL | Status: DC | PRN
Start: 2016-01-19 — End: 2016-01-20

## 2016-01-19 MED ORDER — METOCLOPRAMIDE HCL 5 MG/ML IJ SOLN: 5.0000 mg | Freq: Three times a day (TID) | INTRAMUSCULAR | Status: DC | PRN

## 2016-01-19 MED ORDER — KETOROLAC TROMETHAMINE 15 MG/ML IJ SOLN
7.5000 mg | Freq: Four times a day (QID) | INTRAMUSCULAR | Status: AC
  Administered 2016-01-19 – 2016-01-20 (×4): 7.5 mg via INTRAVENOUS
  Filled 2016-01-19 (×3): qty 1

## 2016-01-19 MED ORDER — SCOPOLAMINE 1 MG/3DAYS TD PT72
1.0000 | MEDICATED_PATCH | TRANSDERMAL | Status: DC
  Administered 2016-01-19: 1 via TRANSDERMAL
  Filled 2016-01-19: qty 1

## 2016-01-19 MED ORDER — ARTIFICIAL TEARS OP OINT
TOPICAL_OINTMENT | OPHTHALMIC | Status: AC
Start: 2016-01-19 — End: 2016-01-19
  Filled 2016-01-19: qty 3.5

## 2016-01-19 MED ORDER — ONDANSETRON HCL 4 MG PO TABS: 4.0000 mg | ORAL_TABLET | Freq: Four times a day (QID) | ORAL | Status: DC | PRN

## 2016-01-19 MED ORDER — BISACODYL 5 MG PO TBEC: 5.0000 mg | DELAYED_RELEASE_TABLET | Freq: Every day | ORAL | Status: DC | PRN

## 2016-01-19 MED ORDER — GLYCOPYRROLATE 0.2 MG/ML IJ SOLN
INTRAMUSCULAR | Status: DC | PRN
  Administered 2016-01-19 (×2): 0.1 mg via INTRAVENOUS

## 2016-01-19 MED ORDER — PROPOFOL 500 MG/50ML IV EMUL
INTRAVENOUS | Status: DC | PRN
  Administered 2016-01-19: 25 ug/kg/min via INTRAVENOUS

## 2016-01-19 MED ORDER — TAMSULOSIN HCL 0.4 MG PO CAPS
4.0000 mg | ORAL_CAPSULE | Freq: Every evening | ORAL | Status: DC
  Filled 2016-01-19: qty 10

## 2016-01-19 MED ORDER — LEVOTHYROXINE SODIUM 50 MCG PO TABS
50.0000 ug | ORAL_TABLET | Freq: Every day | ORAL | Status: DC
  Administered 2016-01-20: 50 ug via ORAL
  Filled 2016-01-19: qty 1

## 2016-01-19 MED ORDER — PROPOFOL 10 MG/ML IV BOLUS
INTRAVENOUS | Status: DC | PRN
  Administered 2016-01-19: 30 mg via INTRAVENOUS
  Administered 2016-01-19: 50 mg via INTRAVENOUS
  Administered 2016-01-19: 150 mg via INTRAVENOUS

## 2016-01-19 MED ORDER — LIDOCAINE 2% (20 MG/ML) 5 ML SYRINGE
INTRAMUSCULAR | Status: AC
  Filled 2016-01-19: qty 5

## 2016-01-19 MED ORDER — ACETAMINOPHEN 325 MG PO TABS: 650.0000 mg | ORAL_TABLET | Freq: Four times a day (QID) | ORAL | Status: DC | PRN

## 2016-01-19 MED ORDER — MIDAZOLAM HCL 2 MG/2ML IJ SOLN
INTRAMUSCULAR | Status: DC | PRN
  Administered 2016-01-19 (×2): 1 mg via INTRAVENOUS

## 2016-01-19 MED ORDER — ONDANSETRON HCL 4 MG/2ML IJ SOLN
INTRAMUSCULAR | Status: AC
  Filled 2016-01-19: qty 2

## 2016-01-19 MED ORDER — LIDOCAINE HCL (CARDIAC) 20 MG/ML IV SOLN
INTRAVENOUS | Status: DC | PRN
  Administered 2016-01-19: 100 mg via INTRATRACHEAL

## 2016-01-19 MED ORDER — FENTANYL CITRATE (PF) 100 MCG/2ML IJ SOLN
INTRAMUSCULAR | Status: AC
  Filled 2016-01-19: qty 2

## 2016-01-19 MED ORDER — DOCUSATE SODIUM 100 MG PO CAPS
100.0000 mg | ORAL_CAPSULE | Freq: Two times a day (BID) | ORAL | Status: DC
  Administered 2016-01-19 – 2016-01-20 (×2): 100 mg via ORAL
  Filled 2016-01-19 (×2): qty 1

## 2016-01-19 MED ORDER — MIDAZOLAM HCL 2 MG/2ML IJ SOLN
INTRAMUSCULAR | Status: AC
  Filled 2016-01-19: qty 2

## 2016-01-19 MED ORDER — POLYETHYLENE GLYCOL 3350 17 G PO PACK: 17.0000 g | PACK | Freq: Every day | ORAL | Status: DC | PRN

## 2016-01-19 MED ORDER — SUCCINYLCHOLINE CHLORIDE 200 MG/10ML IV SOSY
PREFILLED_SYRINGE | INTRAVENOUS | Status: AC
  Filled 2016-01-19: qty 10

## 2016-01-19 MED ORDER — FENTANYL CITRATE (PF) 100 MCG/2ML IJ SOLN: 25.0000 ug | INTRAMUSCULAR | Status: DC | PRN

## 2016-01-19 MED ORDER — MENTHOL 3 MG MT LOZG: 1.0000 | LOZENGE | OROMUCOSAL | Status: DC | PRN

## 2016-01-19 MED ORDER — ARTIFICIAL TEARS OP OINT
TOPICAL_OINTMENT | OPHTHALMIC | Status: DC | PRN
  Administered 2016-01-19: 1 via OPHTHALMIC

## 2016-01-19 MED ORDER — CHLORHEXIDINE GLUCONATE 4 % EX LIQD: 60.0000 mL | Freq: Once | CUTANEOUS | Status: DC

## 2016-01-19 MED ORDER — DIPHENHYDRAMINE HCL 12.5 MG/5ML PO ELIX
12.5000 mg | ORAL_SOLUTION | ORAL | Status: DC | PRN
Start: 2016-01-19 — End: 2016-01-20

## 2016-01-19 MED ORDER — PHENYLEPHRINE HCL 10 MG/ML IJ SOLN
INTRAVENOUS | Status: DC | PRN
  Administered 2016-01-19: 50 ug/min via INTRAVENOUS

## 2016-01-19 MED ORDER — PHENOL 1.4 % MT LIQD
1.0000 | OROMUCOSAL | Status: DC | PRN
Start: 2016-01-19 — End: 2016-01-20

## 2016-01-19 MED ORDER — OXYCODONE HCL 5 MG PO TABS
5.0000 mg | ORAL_TABLET | ORAL | Status: DC | PRN
  Administered 2016-01-19: 5 mg via ORAL
  Administered 2016-01-20 (×4): 10 mg via ORAL
  Filled 2016-01-19 (×2): qty 2
  Filled 2016-01-19: qty 1
  Filled 2016-01-19 (×2): qty 2

## 2016-01-19 MED ORDER — DIAZEPAM 5 MG PO TABS
5.0000 mg | ORAL_TABLET | Freq: Four times a day (QID) | ORAL | Status: DC | PRN
  Administered 2016-01-19: 5 mg via ORAL
  Filled 2016-01-19: qty 1

## 2016-01-19 MED ORDER — BUPIVACAINE-EPINEPHRINE (PF) 0.5% -1:200000 IJ SOLN
INTRAMUSCULAR | Status: DC | PRN
  Administered 2016-01-19: 30 mL via PERINEURAL

## 2016-01-19 MED ORDER — ALUM & MAG HYDROXIDE-SIMETH 200-200-20 MG/5ML PO SUSP: 30.0000 mL | ORAL | Status: DC | PRN

## 2016-01-19 MED ORDER — FLEET ENEMA 7-19 GM/118ML RE ENEM: 1.0000 | ENEMA | Freq: Once | RECTAL | Status: DC | PRN

## 2016-01-19 MED ORDER — LOSARTAN POTASSIUM 25 MG PO TABS
25.0000 mg | ORAL_TABLET | Freq: Every day | ORAL | Status: DC
  Administered 2016-01-20: 25 mg via ORAL
  Filled 2016-01-19: qty 1

## 2016-01-19 MED ORDER — FENTANYL CITRATE (PF) 100 MCG/2ML IJ SOLN
INTRAMUSCULAR | Status: DC | PRN
  Administered 2016-01-19: 50 ug via INTRAVENOUS

## 2016-01-19 MED ORDER — LACTATED RINGERS IV SOLN
INTRAVENOUS | Status: DC
  Administered 2016-01-19 – 2016-01-20 (×2): via INTRAVENOUS

## 2016-01-19 MED ORDER — PROMETHAZINE HCL 25 MG PO TABS: 25.0000 mg | ORAL_TABLET | Freq: Four times a day (QID) | ORAL | Status: DC | PRN

## 2016-01-19 MED ORDER — ACETAMINOPHEN 650 MG RE SUPP
650.0000 mg | Freq: Four times a day (QID) | RECTAL | Status: DC | PRN
Start: 2016-01-19 — End: 2016-01-20

## 2016-01-19 MED ORDER — PROMETHAZINE HCL 25 MG/ML IJ SOLN: 12.5000 mg | Freq: Four times a day (QID) | INTRAMUSCULAR | Status: DC | PRN

## 2016-01-19 MED ORDER — 0.9 % SODIUM CHLORIDE (POUR BTL) OPTIME
TOPICAL | Status: DC | PRN
  Administered 2016-01-19: 1000 mL

## 2016-01-19 MED ORDER — PHENYLEPHRINE HCL 10 MG/ML IJ SOLN
INTRAMUSCULAR | Status: DC | PRN
  Administered 2016-01-19: 120 ug via INTRAVENOUS

## 2016-01-19 MED ORDER — LACTATED RINGERS IV SOLN
INTRAVENOUS | Status: DC | PRN
  Administered 2016-01-19 (×2): via INTRAVENOUS

## 2016-01-19 MED ORDER — KETAMINE HCL-SODIUM CHLORIDE 100-0.9 MG/10ML-% IV SOSY
PREFILLED_SYRINGE | INTRAVENOUS | Status: AC
  Filled 2016-01-19: qty 10

## 2016-01-19 MED ORDER — KETAMINE HCL 10 MG/ML IJ SOLN
INTRAMUSCULAR | Status: DC | PRN
Start: 2016-01-19 — End: 2016-01-19
  Administered 2016-01-19 (×2): 20 mg via INTRAVENOUS

## 2016-01-19 MED ORDER — CEFAZOLIN SODIUM-DEXTROSE 2-4 GM/100ML-% IV SOLN
2.0000 g | INTRAVENOUS | Status: AC
  Administered 2016-01-19: 2 g via INTRAVENOUS
  Filled 2016-01-19: qty 100

## 2016-01-19 MED ORDER — PHENYLEPHRINE 40 MCG/ML (10ML) SYRINGE FOR IV PUSH (FOR BLOOD PRESSURE SUPPORT)
PREFILLED_SYRINGE | INTRAVENOUS | Status: AC
  Filled 2016-01-19: qty 10

## 2016-01-19 MED ORDER — HYDROMORPHONE HCL 1 MG/ML IJ SOLN
1.0000 mg | INTRAMUSCULAR | Status: DC | PRN
  Administered 2016-01-20: 1 mg via INTRAVENOUS
  Filled 2016-01-19: qty 1

## 2016-01-19 SURGICAL SUPPLY — 59 items
ADH SKN CLS LQ APL DERMABOND (GAUZE/BANDAGES/DRESSINGS) ×1
BLADE SAW SGTL 83.5X18.5 (BLADE) ×3 IMPLANT
CEMENT BONE DEPUY (Cement) ×3 IMPLANT
COVER SURGICAL LIGHT HANDLE (MISCELLANEOUS) ×3 IMPLANT
DERMABOND ADHESIVE PROPEN (GAUZE/BANDAGES/DRESSINGS) ×2
DERMABOND ADVANCED .7 DNX6 (GAUZE/BANDAGES/DRESSINGS) ×1 IMPLANT
DRAPE ORTHO SPLIT 77X108 STRL (DRAPES) ×6
DRAPE SURG 17X11 SM STRL (DRAPES) ×3 IMPLANT
DRAPE SURG ORHT 6 SPLT 77X108 (DRAPES) ×2 IMPLANT
DRAPE U-SHAPE 47X51 STRL (DRAPES) ×3 IMPLANT
DRSG AQUACEL AG ADV 3.5X10 (GAUZE/BANDAGES/DRESSINGS) ×3 IMPLANT
DURAPREP 26ML APPLICATOR (WOUND CARE) ×3 IMPLANT
ELECT BLADE 4.0 EZ CLEAN MEGAD (MISCELLANEOUS) ×3
ELECT CAUTERY BLADE 6.4 (BLADE) ×3 IMPLANT
ELECT REM PT RETURN 9FT ADLT (ELECTROSURGICAL) ×3
ELECTRODE BLDE 4.0 EZ CLN MEGD (MISCELLANEOUS) ×1 IMPLANT
ELECTRODE REM PT RTRN 9FT ADLT (ELECTROSURGICAL) ×1 IMPLANT
FACESHIELD WRAPAROUND (MASK) ×9 IMPLANT
GLENOID WITH CLEAT MEDIUM (Shoulder) ×3 IMPLANT
GLOVE BIO SURGEON STRL SZ7.5 (GLOVE) ×3 IMPLANT
GLOVE BIO SURGEON STRL SZ8 (GLOVE) ×3 IMPLANT
GLOVE EUDERMIC 7 POWDERFREE (GLOVE) ×3 IMPLANT
GLOVE SS BIOGEL STRL SZ 7.5 (GLOVE) ×1 IMPLANT
GLOVE SUPERSENSE BIOGEL SZ 7.5 (GLOVE) ×2
GOWN STRL REUS W/ TWL LRG LVL3 (GOWN DISPOSABLE) ×1 IMPLANT
GOWN STRL REUS W/ TWL XL LVL3 (GOWN DISPOSABLE) ×2 IMPLANT
GOWN STRL REUS W/TWL LRG LVL3 (GOWN DISPOSABLE) ×2
GOWN STRL REUS W/TWL XL LVL3 (GOWN DISPOSABLE) ×4
HEAD HUMERAL UNIVERSE 42X17 (Head) ×3 IMPLANT
KIT BASIN OR (CUSTOM PROCEDURE TRAY) ×3 IMPLANT
KIT ROOM TURNOVER OR (KITS) ×3 IMPLANT
KIT SET UNIVERSAL (KITS) ×3 IMPLANT
MANIFOLD NEPTUNE II (INSTRUMENTS) ×3 IMPLANT
NDL SUT .5 MAYO 1.404X.05X (NEEDLE) ×1 IMPLANT
NDL SUT 6 .5 CRC .975X.05 MAYO (NEEDLE) ×1 IMPLANT
NEEDLE MAYO TAPER (NEEDLE) ×4
NS IRRIG 1000ML POUR BTL (IV SOLUTION) ×3 IMPLANT
PACK SHOULDER (CUSTOM PROCEDURE TRAY) ×3 IMPLANT
PAD ARMBOARD 7.5X6 YLW CONV (MISCELLANEOUS) ×6 IMPLANT
RESTRAINT HEAD UNIVERSAL NS (MISCELLANEOUS) ×3 IMPLANT
SLING ARM FOAM STRAP LRG (SOFTGOODS) IMPLANT
SLING ARM XL FOAM STRAP (SOFTGOODS) ×3 IMPLANT
SMARTMIX MINI TOWER (MISCELLANEOUS) ×3
SPONGE LAP 18X18 X RAY DECT (DISPOSABLE) ×3 IMPLANT
SPONGE LAP 4X18 X RAY DECT (DISPOSABLE) ×3 IMPLANT
STEM APEX UNIVERSAL 6MM SHOULD (Stem) ×3 IMPLANT
SUCTION FRAZIER HANDLE 10FR (MISCELLANEOUS) ×2
SUCTION TUBE FRAZIER 10FR DISP (MISCELLANEOUS) ×1 IMPLANT
SUT FIBERWIRE #2 38 T-5 BLUE (SUTURE) ×12
SUT MNCRL AB 3-0 PS2 18 (SUTURE) ×3 IMPLANT
SUT MON AB 2-0 CT1 36 (SUTURE) ×3 IMPLANT
SUT VIC AB 1 CT1 27 (SUTURE) ×2
SUT VIC AB 1 CT1 27XBRD ANBCTR (SUTURE) ×1 IMPLANT
SUTURE FIBERWR #2 38 T-5 BLUE (SUTURE) ×4 IMPLANT
SYR CONTROL 10ML LL (SYRINGE) IMPLANT
TOWEL OR 17X24 6PK STRL BLUE (TOWEL DISPOSABLE) ×3 IMPLANT
TOWEL OR 17X26 10 PK STRL BLUE (TOWEL DISPOSABLE) ×3 IMPLANT
TOWER SMARTMIX MINI (MISCELLANEOUS) ×1 IMPLANT
WATER STERILE IRR 1000ML POUR (IV SOLUTION) ×3 IMPLANT

## 2016-01-19 NOTE — Transfer of Care (Signed)
Immediate Anesthesia Transfer of Care Note  Patient: [name]  Procedure(s) Performed: Procedure(s): TOTAL SHOULDER ARTHROPLASTY (Left)  Patient Location: PACU  Anesthesia Type:General and Regional  Level of Consciousness: awake, alert  and patient cooperative  Airway & Oxygen Therapy: Patient Spontanous Breathing and Patient connected to nasal cannula oxygen  Post-op Assessment: Report given to RN, Post -op Vital signs reviewed and stable, Patient moving all extremities and Patient able to stick tongue midline  Post vital signs: Reviewed and stable  Last Vitals:  Vitals:   01/19/16 0626  BP: (!) 159/110  Pulse: 65  Resp: 20  Temp: 36.7 C    Last Pain:  Vitals:   01/19/16 0626  TempSrc: Oral  PainSc:          Complications: No apparent anesthesia complications

## 2016-01-19 NOTE — Anesthesia Procedure Notes (Addendum)
Anesthesia Regional Block:  Interscalene brachial plexus block  Pre-Anesthetic Checklist: ,, timeout performed, Correct Patient, Correct Site, Correct Laterality, Correct Procedure, Correct Position, site marked, Risks and benefits discussed,  Surgical consent,  Pre-op evaluation,  At surgeon's request and post-op pain management  Laterality: Left  Prep: chloraprep       Needles:  Injection technique: Single-shot  Needle Type: Echogenic Stimulator Needle     Needle Length: 9cm 9 cm Needle Gauge: 21 and 21 G    Additional Needles:  Procedures: ultrasound guided (picture in chart) Interscalene brachial plexus block Narrative:  Start time: 01/19/2016 7:02 AM End time: 01/19/2016 7:05 AM Injection made incrementally with aspirations every 5 mL.  Performed by: Personally  Anesthesiologist: Nilda Simmer

## 2016-01-19 NOTE — Anesthesia Procedure Notes (Signed)
Procedure Name: Intubation Date/Time: 01/19/2016 7:50 AM Performed by: Nilda Simmer Pre-anesthesia Checklist: Patient identified, Emergency Drugs available, Suction available and Patient being monitored Patient Re-evaluated:Patient Re-evaluated prior to inductionOxygen Delivery Method: Circle system utilized Preoxygenation: Pre-oxygenation with 100% oxygen Intubation Type: IV induction Ventilation: Mask ventilation without difficulty Grade View: Grade I Tube type: Oral Tube size: 7.0 mm Number of attempts: 1 Airway Equipment and Method: Stylet Placement Confirmation: ETT inserted through vocal cords under direct vision,  positive ETCO2 and breath sounds checked- equal and bilateral Secured at: 22 cm Tube secured with: Tape Dental Injury: Teeth and Oropharynx as per pre-operative assessment

## 2016-01-19 NOTE — Op Note (Signed)
01/19/2016  9:17 AM  PATIENT:   [name]  70 y.o. female  PRE-OPERATIVE DIAGNOSIS:  LEFT SHOULDER OA  POST-OPERATIVE DIAGNOSIS:  same  PROCEDURE:  L TSA #6 stem, 42x17 head, medium glenoid  SURGEON:  Malachy Coleman, Metta Clines M.D.  ASSISTANTS: Shuford pac   ANESTHESIA:   GET + ISB  EBL: 100  SPECIMEN:  none  Drains: none   PATIENT DISPOSITION:  PACU - hemodynamically stable.    PLAN OF CARE: Admit for overnight observation  Dictation# [id number]   Contact # [phone]

## 2016-01-19 NOTE — H&P (Signed)
[name]    Chief Complaint: LEFT SHOULDER OA HPI: The patient is a 70 y.o. female with end stage left shoulder OA  Past Medical History:  Diagnosis Date  . Arthritis   . Complication of anesthesia    unable to get up due dizziness,nausea- stayed overnite  . Hypertension   . Hypothyroidism   . Kidney stone   . Stones in the urinary tract    recent -last week 9-17    Past Surgical History:  Procedure Laterality Date  . ABDOMINAL HYSTERECTOMY    . BACK SURGERY    . SHOULDER SURGERY Left 2004   arthroscopy    History reviewed. No pertinent family history.  Social History:  reports that she has never smoked. She has never used smokeless tobacco. She reports that she drinks alcohol. She reports that she does not use drugs.   Medications Prior to Admission  Medication Sig Dispense Refill  . acetaminophen (TYLENOL) 650 MG CR tablet Take 1,300 mg by mouth every 8 (eight) hours as needed for pain.    . Calcium-Vitamin D-Vitamin K (VIACTIV PO) Take 2 tablets by mouth daily.    . diclofenac (VOLTAREN) 50 MG EC tablet Take 100 mg by mouth 1 day or 1 dose.    . Estradiol 1 MG/GM GEL Place 1 mg onto the skin every other day.    . levothyroxine (SYNTHROID, LEVOTHROID) 50 MCG tablet Take 50 mcg by mouth daily before breakfast.     . losartan (COZAAR) 25 MG tablet TAKE 1 TABLET DAILY  0  . Omega-3 Fatty Acids (FISH OIL) 1000 MG CAPS Take 1,000 mg by mouth daily.    Marland Kitchen oxyCODONE (OXY IR/ROXICODONE) 5 MG immediate release tablet Take 1-3 tablets by mouth every 4 (four) hours as needed.  0  . pravastatin (PRAVACHOL) 40 MG tablet Take 40 mg by mouth See admin instructions. Patient takes 1 tablet every few days not daily.    . promethazine (PHENERGAN) 25 MG tablet Take 25 mg by mouth every 6 (six) hours as needed.  0  . psyllium (METAMUCIL) 58.6 % powder Take 1 packet by mouth daily.    . tamsulosin (FLOMAX) 0.4 MG CAPS capsule Take 4 mg by mouth every evening.  0  . ibuprofen  (ADVIL,MOTRIN) 200 MG tablet Take 400 mg by mouth every 6 (six) hours as needed for mild pain.       Physical Exam: left shoulder with painful and restricted motion as noted at recent office visits  Vitals  Temp:  [98 F (36.7 C)] 98 F (36.7 C) (09/28 0626) Pulse Rate:  [65] 65 (09/28 0626) Resp:  [20] 20 (09/28 0626) BP: (159)/(110) 159/110 (09/28 0626) SpO2:  [98 %] 98 % (09/28 0626) Weight:  [81.2 kg (179 lb)] 81.2 kg (179 lb) (09/28 0626)  Assessment/Plan  Impression: LEFT SHOULDER OA  Plan of Action: Procedure(s): TOTAL SHOULDER ARTHROPLASTY  Buena Boehm M [name] 01/19/2016, 7:24 AM Contact # [phone]

## 2016-01-19 NOTE — Discharge Instructions (Signed)

## 2016-01-19 NOTE — Anesthesia Postprocedure Evaluation (Signed)
Anesthesia Post Note  Patient: [name]  Procedure(s) Performed: Procedure(s) (LRB): TOTAL SHOULDER ARTHROPLASTY (Left)  Patient location during evaluation: PACU Anesthesia Type: General and Regional Level of consciousness: awake and alert Pain management: pain level controlled Vital Signs Assessment: post-procedure vital signs reviewed and stable Respiratory status: spontaneous breathing, nonlabored ventilation and respiratory function stable Cardiovascular status: blood pressure returned to baseline and stable Postop Assessment: no signs of nausea or vomiting Anesthetic complications: no    Last Vitals:  Vitals:   01/19/16 1130 01/19/16 1145  BP:  131/67  Pulse:  (!) 59  Resp:  15  Temp: 36.2 C 36.4 C    Last Pain:  Vitals:   01/19/16 1332  TempSrc:   PainSc: 0-No pain                 Nilda Simmer

## 2016-01-20 ENCOUNTER — Encounter (HOSPITAL_COMMUNITY): Payer: Self-pay | Admitting: Orthopedic Surgery

## 2016-01-20 MED ORDER — OXYCODONE-ACETAMINOPHEN 5-325 MG PO TABS: 1.0000 | ORAL_TABLET | ORAL | 0 refills | Status: DC | PRN

## 2016-01-20 MED ORDER — ONDANSETRON HCL 4 MG PO TABS: 4.0000 mg | ORAL_TABLET | Freq: Three times a day (TID) | ORAL | 0 refills | Status: DC | PRN

## 2016-01-20 MED ORDER — DIAZEPAM 5 MG PO TABS: 2.5000 mg | ORAL_TABLET | Freq: Four times a day (QID) | ORAL | 1 refills | Status: DC | PRN

## 2016-01-20 NOTE — Progress Notes (Signed)
Discharge Note:    Patient alert and oriented X 4 and in no distress.  Patient given discharge instructions regarding signs and symptoms to report, medications, diet, activity, and upcoming appointments.  She verbalized understanding of all instructions.  Peripheral IV discontinued.  Patient confirmed that she had all of her personal belongings.  She was transported out via wheelchair by NT.

## 2016-01-20 NOTE — Evaluation (Signed)
Occupational Therapy Evaluation Patient Details Name: [name] MRN: [id number] DOB: 1946/01/08 Today's Date: 01/20/2016    History of Present Illness Pt is a 70 y.o. female now s/p Lt TSA. PMH: HTN, back surgery.    Clinical Impression   PTA, pt was independent with all ADL. Pt admitted and underwent the above. Currently, pt requires min assist for UB dressing tasks to adhere to shoulder precautions. Pt requires min guard assist for tub transfers to maintain balance. Pt and husband educated concerning precautions during ADL and exercise program as detailed below. Pt requires further reinforcement. OT will continue to follow to continue education with HEP. OT recommends D/C home with  24 hour supervision/assistance and no further OT services post-acute D/C.    Follow Up Recommendations  No OT follow up;Supervision/Assistance - 24 hour    Equipment Recommendations  None recommended by OT    Recommendations for Other Services       Precautions / Restrictions Precautions Precautions: Shoulder;Fall Type of Shoulder Precautions: Passive protocol Shoulder Interventions: Shoulder sling/immobilizer;Off for dressing/bathing/exercises Precaution Booklet Issued: Yes (comment) Precaution Comments: No AROM L shoulder; elbow/wrist/hand AROM Required Braces or Orthoses: Sling Restrictions Weight Bearing Restrictions: Yes LUE Weight Bearing: Non weight bearing      Mobility Bed Mobility Overal bed mobility: Needs Assistance Bed Mobility: Supine to Sit;Sit to Supine     Supine to sit: Supervision Sit to supine: Supervision   General bed mobility comments: HOB slightly elevated, approx. 20 degrees. Pt reports using wedge at home.   Transfers Overall transfer level: Needs assistance Equipment used: None Transfers: Sit to/from Stand Sit to Stand: Supervision         General transfer comment: good stability with transfers.     Balance Overall balance assessment: Needs  assistance Sitting-balance support: Feet supported;No upper extremity supported Sitting balance-Leahy Scale: Normal     Standing balance support: No upper extremity supported;During functional activity Standing balance-Leahy Scale: Good                              ADL Overall ADL's : Needs assistance/impaired Eating/Feeding: Supervision/ safety;Set up;Sitting   Grooming: Wash/dry hands;Supervision/safety;Standing   Upper Body Bathing: Supervision/ safety;Cueing for UE precautions;Sitting   Lower Body Bathing: Supervison/ safety;Sit to/from stand   Upper Body Dressing : Minimal assistance;Cueing for UE precautions;Sitting   Lower Body Dressing: Minimal assistance Lower Body Dressing Details (indicate cue type and reason): Min assist to pull up pants and underwear on L side Toilet Transfer: Supervision/safety;Comfort height toilet;Ambulation   Toileting- Clothing Manipulation and Hygiene: Supervision/safety;Sit to/from stand   Tub/ Shower Transfer: Min guard;Ambulation   Functional mobility during ADLs: Minimal assistance General ADL Comments: Pt and family educated on shoulder precautions during ADLs     Vision     Perception     Praxis      Pertinent Vitals/Pain Pain Assessment: Faces Pain Score: 3  Faces Pain Scale: Hurts little more Pain Location: L shoulder Pain Descriptors / Indicators: Operative site guarding;Discomfort;Sore Pain Intervention(s): Monitored during session;Ice applied     Hand Dominance Right   Extremity/Trunk Assessment Upper Extremity Assessment Upper Extremity Assessment: LUE deficits/detail LUE Deficits / Details: Limited ROM and strength post-operatively; No AROM allowed L shoulder   Lower Extremity Assessment Lower Extremity Assessment: Overall WFL for tasks assessed       Communication Communication Communication: No difficulties   Cognition Arousal/Alertness: Awake/alert Behavior During Therapy: WFL for tasks  assessed/performed Overall Cognitive  Status: Within Functional Limits for tasks assessed                     General Comments       Exercises Exercises: Shoulder     Shoulder Instructions Shoulder Instructions Donning/doffing shirt without moving shoulder: Minimal assistance Method for sponge bathing under operated UE: Supervision/safety Donning/doffing sling/immobilizer: Minimal assistance Correct positioning of sling/immobilizer: Min-guard Pendulum exercises (written home exercise program): Min-guard ROM for elbow, wrist and digits of operated UE: Supervision/safety Sling wearing schedule (on at all times/off for ADL's): Supervision/safety Proper positioning of operated UE when showering: Supervision/safety Positioning of UE while sleeping: Supervision/safety    Home Living Family/patient expects to be discharged to:: Private residence Living Arrangements: Spouse/significant other Available Help at Discharge: Family;Available 24 hours/day Type of Home: House Home Access: Stairs to enter [employer] of Steps: 3 (5 in front) Entrance Stairs-Rails: None (None back; R in front) Home Layout: One level     Bathroom Shower/Tub: Tub/shower unit;Curtain Shower/tub characteristics: Architectural technologist: Handicapped height Bathroom Accessibility: Yes   Home Equipment: Shower seat          Prior Functioning/Environment Level of Independence: Independent                 OT Problem List: Decreased strength;Decreased range of motion;Decreased activity tolerance;Impaired balance (sitting and/or standing);Pain;Decreased knowledge of precautions   OT Treatment/Interventions: Self-care/ADL training;Therapeutic exercise    OT Goals(Current goals can be found in the care plan section) Acute Rehab OT Goals Patient Stated Goal: get home, be more active again OT Goal Formulation: With patient/family Time For Goal Achievement: 02/03/16 ADL  Goals Pt/caregiver will Perform Home Exercise Program: Independently;With written HEP provided  OT Frequency: Min 2X/week   Barriers to D/C:            Co-evaluation              End of Session Equipment Utilized During Treatment: Gait belt  Activity Tolerance: Patient tolerated treatment well Patient left: in bed;with call bell/phone within reach   Time: 4917-9150 OT Time Calculation (min): 63 min Charges:  OT Evaluation $OT Eval Moderate Complexity: 1 Procedure OT Treatments $Therapeutic Activity: 23-37 mins $Therapeutic Exercise: 8-22 mins G-Codes:    Norman Herrlich, OTR/L [phone] 01/20/2016, 11:10 AM

## 2016-01-20 NOTE — Op Note (Signed)
[name], [name]                ACCOUNT NO.:  [id number]  MEDICAL RECORD NO.:  [id number]  LOCATION:  5N01C                        FACILITY:  [facility]  PHYSICIAN:  Metta Clines. Elya Tarquinio, M.D.  DATE OF BIRTH:  Apr 17, 1946  DATE OF PROCEDURE:  01/19/2016 DATE OF DISCHARGE:                              OPERATIVE REPORT   PREOPERATIVE DIAGNOSIS:  End-stage left shoulder osteoarthritis.  POSTOPERATIVE DIAGNOSIS:  End-stage left shoulder osteoarthritis.  PROCEDURE:  Left total shoulder arthroplasty utilizing a press-fit size 6 Arthrex stem with a 42 x 17 head and a cemented medium glenoid.  SURGEON:  Metta Clines. Kentley Blyden, M.D.  Terrence DupontOlivia Mackie A. Shuford, P.A.-C.  ANESTHESIA:  General endotracheal as well as interscalene block.  ESTIMATED BLOOD LOSS:  100 mL.  DRAINS:  None.  HISTORY:  [name] is a 70 year old female, who has had chronic and progressive increasing left shoulder pain related to end-stage osteoarthritis.  Plain radiographs show a marked deformity of the glenohumeral joint with collapse of the humeral head, sclerosis of subchondral bone, and peripheral osteophyte formation.  Due to her increasing pain and functional limitations, she is brought to the operating room at this time for planned left total shoulder arthroplasty as described below.  Preoperatively, I counseled [name] regarding treatment options, potential risks versus benefits thereof.  Possible surgical complications were all reviewed including bleeding, infection, neurovascular injury, persistent pain, loss of motion, anesthetic complication, failure of the implant, possible need for additional surgery.  She understands and accepts and agrees with our planned procedure.  PROCEDURE IN DETAIL:  After undergoing routine preop evaluation, the patient received prophylactic antibiotics and an interscalene block was established in the holding area by the Anesthesia Department.  Placed supine on the  operating table.  Underwent smooth induction of a general endotracheal anesthesia.  Placed in the beach-chair position and appropriately padded protected.  Left shoulder girdle region was sterilely prepped and draped in standard fashion.  Time-out was called. An anterior deltopectoral approach was made to the left shoulder through an 8 cm incision.  Skin flaps were elevated and mobilized.  Dissection carried deeply.  Electrocautery used for hemostasis.  Cephalic vein was taken laterally with the deltoid.  The interval was then developed proximal to distal.  The upper 1 centimeter of the pectoralis major was tenotomized for exposure.  Divided adhesions beneath the deltoid.  I then mobilized conjoined tendon, retracted medially with self-retaining retractors.  At this point, we then unroofed the long-head biceps tendon.  This was tenotomized for later tenodesis and then we split the rotator cuff along the rotator interval from the base of the bicipital groove to the base of the coracoid.  We then separated the subscapularis away from the lesser tuberosity leaving 1 cm cuff for later repair and then tagged the free margin of the subscap with a series of grasping #2 FiberWire sutures.  We then divided the capsular tissues from the anteroinferior and inferior aspects of the humeral neck allowing delivery of the humeral head through the wound.  We carefully protected the rotator cuff superiorly and posteriorly, and then outlined our proposed humeral head resection, the extramedullary guide for the humeral head resection with  oscillating saw.  At this point, we then removed osteophytes on the anterior and inferior margins of the humeral neck.  We then broached the canal, we then hand reamed the canal and then broached up to a size 6 which showed a very tight fit which was much to our satisfaction.  We then placed a size 5 stem with a metal cap into the humeral canal, then exposed the glenoid with  combination of Fukuda, pitchfork, and snake tongue retractors.  I performed a circumferential labral resection removing the proximal stump of the long head biceps.  Significant erosion of the glenoid with some retroversion. The size was most appropriate for medium glenoid, central guidepin was then placed, we reamed the glenoid to a stable subchondral bony bed, although certainly there was some evidence for cystic erosions in a number of areas about the glenoid.  Once we had a smooth surface, I removed the debris from the margins of the glenoid as well as some revisional osteophytes.  We then drilled our central glenoid drill hole followed by the superior and inferior peg and slots and then broached the glenoid, the trial showed excellent fit.  At this point, the glenoid was copiously irrigated, cleaned and dried.  We mixed cement and introduced the cement into the superior and inferior drill hole and slot respectively, our glenoid was then impacted in position with excellent fit and fixation.  We returned our attention to the proximal humerus where the humeral stem was then introduced and impacted with excellent press-fit and the proximal screws were tightened with the torque screwdriver.  We then performed a series of trial reductions and ultimately felt that the 42 x 17 head gave [place] the proper coverage of the proximal humerus matching the margins and dimensions of the proximal humeral metaphyseal cutting given good soft tissue balance with approximately 50% translation of the humeral head and glenoid.  At this point, the [facility] taper was then cleaned and dried.  The final 42 x 17 head was impacted, final reduction was then performed.  Again overall, soft tissue balance was much to our satisfaction.  The joint was copiously irrigated.  I mobilized the subscapularis and was then repaired back to the lesser tuberosity through the cuff of soft tissue with #2 FiberWires, we repaired the  rotator interval as well with a pair of figure-of-eight #2 FiberWire sutures.  The biceps tendon was tenodesed at the upper border of the pectoralis major, and the residual proximal stump was excised.  Again irrigated the wound, hemostasis was obtained.  The deltopectoral interval was then reapproximated with a series of figure-of-eight #1 Vicryl sutures, 2-0 Vicryl used for the subcu layer, intracuticular 3-0 Monocryl for the skin followed by Dermabond and Aquacel dressing.  Left arm was placed into a sling.  The patient was awakened, extubated, and taken to recovery room in stable condition.  Jenetta Loges, PA-C, was used as an Environmental consultant throughout the case essential for help with positioning the patient, positioning the extremity, management of the tissue retractors, tissue manipulation, implantation of prosthesis, wound closure, and intraoperative decision making.     Metta Clines. Dora Clauss, M.D.     KMS/MEDQ  D:  01/19/2016  T:  01/20/2016  Job:  [id number]

## 2016-01-20 NOTE — Progress Notes (Signed)
Occupational Therapy Treatment Patient Details Name: [name] MRN: [id number] DOB: Dec 04, 1945 Today's Date: 01/20/2016    History of present illness Pt is a 70 y.o. female now s/p Lt TSA. PMH: HTN, back surgery.    OT comments  Pt able to verbalize and demonstrate adherence to shoulder precautions as well as HEP. Pt progressed well with shoulder exercises and is able to complete with supervision from husband. Pt continues to require supervision for safety and min assist for UB dressing. Husband available to provide all assistance needed. No further OT services required acutely. OT signing off.   Follow Up Recommendations  No OT follow up;Supervision/Assistance - 24 hour    Equipment Recommendations  None recommended by OT    Recommendations for Other Services      Precautions / Restrictions Precautions Precautions: Shoulder;Fall Type of Shoulder Precautions: Passive protocol Shoulder Interventions: Shoulder sling/immobilizer;Off for dressing/bathing/exercises Precaution Booklet Issued: Yes (comment) Precaution Comments: No AROM L shoulder; elbow/wrist/hand AROM Required Braces or Orthoses: Sling Restrictions Weight Bearing Restrictions: Yes LUE Weight Bearing: Non weight bearing       Mobility Bed Mobility Overal bed mobility: Needs Assistance Bed Mobility: Supine to Sit;Sit to Supine     Supine to sit: Supervision Sit to supine: Supervision   General bed mobility comments: HOB slightly elevated, approx. 20 degrees. Pt reports using wedge at home.   Transfers Overall transfer level: Needs assistance Equipment used: None Transfers: Sit to/from Stand Sit to Stand: Supervision         General transfer comment: good stability with transfers.     Balance Overall balance assessment: Needs assistance Sitting-balance support: Feet supported;No upper extremity supported Sitting balance-Leahy Scale: Normal     Standing balance support: During functional  activity;No upper extremity supported Standing balance-Leahy Scale: Good                     ADL Overall ADL's : Needs assistance/impaired Eating/Feeding: Supervision/ safety;Set up;Sitting   Grooming: Wash/dry hands;Supervision/safety;Standing   Upper Body Bathing: Supervision/ safety;Cueing for UE precautions;Sitting   Lower Body Bathing: Supervison/ safety;Sit to/from stand   Upper Body Dressing : Minimal assistance;Cueing for UE precautions;Sitting   Lower Body Dressing: Minimal assistance Lower Body Dressing Details (indicate cue type and reason): Min assist to pull up pants and underwear on L side Toilet Transfer: Supervision/safety;Comfort height toilet;Ambulation   Toileting- Clothing Manipulation and Hygiene: Supervision/safety;Sit to/from stand   Tub/ Shower Transfer: Min guard;Ambulation   Functional mobility during ADLs: Minimal assistance General ADL Comments: Completed education on shoulder precautions during ADLs. Pt and husband demosntrate understanding.      Vision                     Perception     Praxis      Cognition   Behavior During Therapy: WFL for tasks assessed/performed Overall Cognitive Status: Within Functional Limits for tasks assessed                       Extremity/Trunk Assessment  Upper Extremity Assessment Upper Extremity Assessment: LUE deficits/detail LUE Deficits / Details: Limited ROM and strength post-operatively; No AROM allowed L shoulder   Lower Extremity Assessment Lower Extremity Assessment: Overall WFL for tasks assessed        Exercises Shoulder Exercises Pendulum Exercise: 10 reps;Standing Shoulder Flexion: Self ROM;10 reps;Supine Shoulder ABduction: Self ROM;10 reps;Seated Shoulder External Rotation: Self ROM;Supine;10 reps Elbow Flexion: AROM;10 reps;Standing Elbow Extension: AROM;10 reps;Standing Wrist Flexion:  AROM;10 reps;Standing Wrist Extension: AROM;10 reps;Standing Digit  Composite Flexion: AROM;10 reps;Standing Composite Extension: AROM;10 reps;Standing Donning/doffing shirt without moving shoulder: Minimal assistance Method for sponge bathing under operated UE: Supervision/safety Donning/doffing sling/immobilizer: Minimal assistance Correct positioning of sling/immobilizer: Min-guard Pendulum exercises (written home exercise program): Min-guard ROM for elbow, wrist and digits of operated UE: Supervision/safety Sling wearing schedule (on at all times/off for ADL's): Supervision/safety Proper positioning of operated UE when showering: Supervision/safety Positioning of UE while sleeping: Supervision/safety   Shoulder Instructions Shoulder Instructions Donning/doffing shirt without moving shoulder: Minimal assistance Method for sponge bathing under operated UE: Supervision/safety Donning/doffing sling/immobilizer: Minimal assistance Correct positioning of sling/immobilizer: Min-guard Pendulum exercises (written home exercise program): Min-guard ROM for elbow, wrist and digits of operated UE: Supervision/safety Sling wearing schedule (on at all times/off for ADL's): Supervision/safety Proper positioning of operated UE when showering: Supervision/safety Positioning of UE while sleeping: Supervision/safety     General Comments      Pertinent Vitals/ Pain       Pain Assessment: 0-10 Pain Score: 6  Faces Pain Scale: Hurts little more Pain Location: L shoulder Pain Descriptors / Indicators: Discomfort;Grimacing;Operative site guarding Pain Intervention(s): Monitored during session;Repositioned  Home Living Family/patient expects to be discharged to:: Private residence Living Arrangements: Spouse/significant other Available Help at Discharge: Family;Available 24 hours/day Type of Home: House Home Access: Stairs to enter [employer] of Steps: 3 (5 in front) Entrance Stairs-Rails: None (None back; R in front) Home Layout: One level      Bathroom Shower/Tub: Tub/shower unit;Curtain Shower/tub characteristics: Architectural technologist: Handicapped height Bathroom Accessibility: Yes   Home Equipment: Shower seat          Prior Functioning/Environment Level of Independence: Independent            Frequency  Min 2X/week        Progress Toward Goals  OT Goals(current goals can now be found in the care plan section)  Progress towards OT goals: Goals met/education completed, patient discharged from OT  Acute Rehab OT Goals Patient Stated Goal: get home, be more active again OT Goal Formulation: With patient/family Time For Goal Achievement: 02/03/16 ADL Goals Pt/caregiver will Perform Home Exercise Program: Independently;With written HEP provided  Plan Discharge plan remains appropriate    Co-evaluation                 End of Session Equipment Utilized During Treatment: Gait belt   Activity Tolerance Patient tolerated treatment well   Patient Left in bed;with call bell/phone within reach   Nurse Communication  (OT complete)        Time: 1210-1228 OT Time Calculation (min): 18 min  Charges: OT Evaluation $OT Eval Moderate Complexity: 1 Procedure OT Treatments $Therapeutic Activity: 23-37 mins $Therapeutic Exercise: 8-22 mins  Norman Herrlich, OTR/L [phone]  01/20/2016, 12:33 PM

## 2016-01-20 NOTE — Evaluation (Signed)
Physical Therapy Evaluation Patient Details Name: [name] MRN: [id number] DOB: [birth year] Today's Date: 01/20/2016   History of Present Illness  Pt is a 70 y.o. female now s/p Lt TSA. PMH: HTN, back surgery.   Clinical Impression  Patient evaluated by Physical Therapy with no further acute PT needs identified. Pt able to ambulate 400 ft without an assistive device and up/down stairs. All education has been completed and the patient has no further questions. No follow-up Physical Therapy or equipment needs identified, pt in agreement. PT is signing off. Thank you for this referral.     Follow Up Recommendations No PT follow up;Supervision for mobility/OOB    Equipment Recommendations  None recommended by PT    Recommendations for Other Services       Precautions / Restrictions Precautions Precautions: Shoulder Required Braces or Orthoses: Sling Restrictions Weight Bearing Restrictions: Yes LUE Weight Bearing: Non weight bearing      Mobility  Bed Mobility Overal bed mobility: Needs Assistance Bed Mobility: Supine to Sit     Supine to sit: Supervision     General bed mobility comments: HOB slightly elevated, approx. 20 degrees. Pt reports using wedge at home.   Transfers Overall transfer level: Needs assistance Equipment used: None Transfers: Sit to/from Stand Sit to Stand: Min guard         General transfer comment: good stability with transfers.   Ambulation/Gait Ambulation/Gait assistance: Supervision Ambulation Distance (Feet): 400 Feet Assistive device: None Gait Pattern/deviations: Step-through pattern Gait velocity: mild decrease   General Gait Details: 1 mild episode of instability to Rt side with independent recovery.  Stairs Stairs: Yes Stairs assistance: Min guard     General stair comments: Up 2 (no rail), Up 3 with rail. Improved stability with use of rail. Recommeinding use of rail at home. Pt in agreement.   Wheelchair Mobility     Modified Rankin (Stroke Patients Only)       Balance Overall balance assessment: Needs assistance Sitting-balance support: No upper extremity supported Sitting balance-Leahy Scale: Normal     Standing balance support: No upper extremity supported Standing balance-Leahy Scale: Good                               Pertinent Vitals/Pain Pain Assessment: 0-10 Pain Score: 3  Pain Location: Lt shoulder Pain Descriptors / Indicators: Aching Pain Intervention(s): Limited activity within patient's tolerance;Monitored during session;Ice applied    Home Living Family/patient expects to be discharged to:: Private residence Living Arrangements: Spouse/significant other Available Help at Discharge: Family;Available 24 hours/day Type of Home: House Home Access: Stairs to enter Entrance Stairs-Rails:  (none side, Rt front) Entrance Stairs-Number of Steps: 3 (3 side, 5 front) Home Layout: One level Home Equipment: None      Prior Function Level of Independence: Independent               Hand Dominance        Extremity/Trunk Assessment   Upper Extremity Assessment: Defer to OT evaluation           Lower Extremity Assessment: Overall WFL for tasks assessed         Communication   Communication: No difficulties  Cognition Arousal/Alertness: Awake/alert Behavior During Therapy: WFL for tasks assessed/performed Overall Cognitive Status: Within Functional Limits for tasks assessed                      General Comments  Exercises     Assessment/Plan    PT Assessment Patent does not need any further PT services  PT Problem List            PT Treatment Interventions      PT Goals (Current goals can be found in the Care Plan section)  Acute Rehab PT Goals Patient Stated Goal: get home, be more active again PT Goal Formulation: With patient Time For Goal Achievement: 01/20/16 Potential to Achieve Goals: Good    Frequency      Barriers to discharge        Co-evaluation               End of Session Equipment Utilized During Treatment: Gait belt Activity Tolerance: Patient tolerated treatment well Patient left: in chair;with call bell/phone within reach Nurse Communication: Mobility status    Functional Assessment Tool Used: clinical judgment Functional Limitation: Mobility: Walking and moving around Mobility: Walking and Moving Around Current Status [phone]): At least 1 percent but less than 20 percent impaired, limited or restricted Mobility: Walking and Moving Around Goal Status [phone]): At least 1 percent but less than 20 percent impaired, limited or restricted Mobility: Walking and Moving Around Discharge Status [phone]): At least 1 percent but less than 20 percent impaired, limited or restricted    Time: 0822-0850 PT Time Calculation (min) (ACUTE ONLY): 28 min   Charges:   PT Evaluation $PT Eval Moderate Complexity: 1 Procedure PT Treatments $Gait Training: 8-22 mins   PT G Codes:   PT G-Codes **NOT FOR INPATIENT CLASS** Functional Assessment Tool Used: clinical judgment Functional Limitation: Mobility: Walking and moving around Mobility: Walking and Moving Around Current Status ([id number]): At least 1 percent but less than 20 percent impaired, limited or restricted Mobility: Walking and Moving Around Goal Status [phone]): At least 1 percent but less than 20 percent impaired, limited or restricted Mobility: Walking and Moving Around Discharge Status [phone]): At least 1 percent but less than 20 percent impaired, limited or restricted    Cassell Clement, PT, [place] Pager [phone] Office [phone]  01/20/2016, 9:03 AM

## 2016-01-20 NOTE — Care Management Note (Signed)
Case Management Note  Patient Details  Name: [name] MRN: [id number] Date of Birth: Jul 06, 1945  Subjective/Objective:    S/p Left TSA                Action/Plan: Discharge Planning: AVS reviewed: Chart reviewed. No HH OT recommended.   Expected Discharge Date:  01/20/2016              Expected Discharge Plan:  Home/Self Care  In-House Referral:  NA  Discharge planning Services  CM Consult  Post Acute Care Choice:  NA Choice offered to:  NA  DME Arranged:  N/A DME Agency:  NA  HH Arranged:  NA HH Agency:  NA  Status of Service:  Completed, signed off  If discussed at [place] of Stay Meetings, dates discussed:    Additional Comments:  Erenest Rasher, RN 01/20/2016, 2:07 PM

## 2016-01-20 NOTE — Discharge Summary (Signed)
PATIENT ID:      [name]  MRN:     [id number] DOB/AGE:    28-Mar-1946 / 70 y.o.     DISCHARGE SUMMARY  ADMISSION DATE:    01/19/2016 DISCHARGE DATE:    ADMISSION DIAGNOSIS: LEFT SHOULDER OA Past Medical History:  Diagnosis Date  . Arthritis   . Complication of anesthesia    unable to get up due dizziness,nausea- stayed overnite  . Hypertension   . Hypothyroidism   . Kidney stone   . Stones in the urinary tract    recent -last week 9-17    DISCHARGE DIAGNOSIS:   Active Problems:   S/P shoulder replacement   PROCEDURE: Procedure(s): TOTAL SHOULDER ARTHROPLASTY on 01/19/2016  CONSULTS:    HISTORY:  See H&P in chart.  HOSPITAL COURSE:  [name] is a 70 y.o. admitted on 01/19/2016 with a diagnosis of LEFT SHOULDER OA.  They were brought to the operating room on 01/19/2016 and underwent Procedure(s): TOTAL SHOULDER ARTHROPLASTY.    They were given perioperative antibiotics: Anti-infectives    Start     Dose/Rate Route Frequency Ordered Stop   01/19/16 1145  ceFAZolin (ANCEF) IVPB 2g/100 mL premix     2 g 200 mL/hr over 30 Minutes Intravenous Every 6 hours 01/19/16 1140 01/20/16 0035   01/19/16 0552  ceFAZolin (ANCEF) IVPB 2g/100 mL premix     2 g 200 mL/hr over 30 Minutes Intravenous On call to O.R. 01/19/16 3329 01/19/16 0735    .  Patient underwent the above named procedure and tolerated it well. The following day they were hemodynamically stable and pain was controlled on oral analgesics. They were neurovascularly intact to the operative extremity. OT was ordered and worked with patient per protocol. They were medically and orthopaedically stable for discharge on day 1. Home health services were arranged .    DIAGNOSTIC STUDIES:  RECENT RADIOGRAPHIC STUDIES :  No results found.  RECENT VITAL SIGNS:  Patient Vitals for the past 24 hrs:  BP Temp Temp src Pulse Resp SpO2  01/20/16 0448 (!) 105/58 98.8 F (37.1 C) Oral 69 16 96 %  01/20/16 0015 121/62 100.3  F (37.9 C) Oral 75 16 95 %  01/19/16 1944 125/75 99.4 F (37.4 C) Oral 75 16 95 %  01/19/16 1145 131/67 97.5 F (36.4 C) Oral (!) 59 15 95 %  01/19/16 1130 - 97.1 F (36.2 C) - - - -  01/19/16 1129 - - - 67 - 94 %  01/19/16 1119 135/85 - - (!) 57 12 95 %  01/19/16 1115 - - - (!) 55 11 95 %  01/19/16 1104 130/72 - - (!) 59 14 94 %  01/19/16 1100 - - - (!) 57 13 95 %  01/19/16 1049 139/77 - - (!) 57 10 96 %  01/19/16 1045 - - - 71 13 93 %  01/19/16 1034 137/82 - - 60 11 93 %  01/19/16 1030 - - - 60 (!) 9 95 %  01/19/16 1019 140/90 - - 61 10 97 %  01/19/16 1015 - - - 76 12 96 %  01/19/16 1004 140/88 - - 71 10 96 %  01/19/16 1000 - - - 71 11 96 %  01/19/16 0949 (!) 156/103 - - 78 10 94 %  01/19/16 0945 - - - 82 11 96 %  01/19/16 0935 - 97.3 F (36.3 C) - - - -  01/19/16 0934 (!) 156/101 - - 93 13 93 %  .  RECENT EKG RESULTS:    Orders placed or performed during the hospital encounter of 01/11/16  . EKG 12 lead  . EKG 12 lead  . EKG 12-Lead  . EKG 12-Lead    DISCHARGE INSTRUCTIONS:    DISCHARGE MEDICATIONS:     Medication List    STOP taking these medications   oxyCODONE 5 MG immediate release tablet Commonly known as:  Oxy IR/ROXICODONE     TAKE these medications   acetaminophen 650 MG CR tablet Commonly known as:  TYLENOL Take 1,300 mg by mouth every 8 (eight) hours as needed for pain.   diazepam 5 MG tablet Commonly known as:  VALIUM Take 0.5-1 tablets (2.5-5 mg total) by mouth every 6 (six) hours as needed for muscle spasms or sedation.   diclofenac 50 MG EC tablet Commonly known as:  VOLTAREN Take 100 mg by mouth 1 day or 1 dose.   Estradiol 1 MG/GM Gel Place 1 mg onto the skin every other day.   Fish Oil 1000 MG Caps Take 1,000 mg by mouth daily.   ibuprofen 200 MG tablet Commonly known as:  ADVIL,MOTRIN Take 400 mg by mouth every 6 (six) hours as needed for mild pain.   levothyroxine 50 MCG tablet Commonly known as:  SYNTHROID,  LEVOTHROID Take 50 mcg by mouth daily before breakfast.   losartan 25 MG tablet Commonly known as:  COZAAR TAKE 1 TABLET DAILY   ondansetron 4 MG tablet Commonly known as:  ZOFRAN Take 1 tablet (4 mg total) by mouth every 8 (eight) hours as needed for nausea or vomiting.   oxyCODONE-acetaminophen 5-325 MG tablet Commonly known as:  PERCOCET Take 1-2 tablets by mouth every 4 (four) hours as needed.   pravastatin 40 MG tablet Commonly known as:  PRAVACHOL Take 40 mg by mouth See admin instructions. Patient takes 1 tablet every few days not daily.   promethazine 25 MG tablet Commonly known as:  PHENERGAN Take 25 mg by mouth every 6 (six) hours as needed.   psyllium 58.6 % powder Commonly known as:  METAMUCIL Take 1 packet by mouth daily.   tamsulosin 0.4 MG Caps capsule Commonly known as:  FLOMAX Take 4 mg by mouth every evening.   VIACTIV PO Take 2 tablets by mouth daily.       FOLLOW UP VISIT:   Follow-up Information    Metta Clines SUPPLE, MD.   Specialty:  Orthopedic Surgery Why:  call to be seen in 10-14 days Contact information: [address] [place] [address] [phone]           DISCHARGE TO: Home   DISPOSITION: Good  DISCHARGE CONDITION:  Festus Barren for Dr. Justice Britain 01/20/2016, 7:54 AM

## 2016-01-24 NOTE — Addendum Note (Signed)
Addendum  created 01/24/16 0910 by Nilda Simmer, MD   Anesthesia Intra Blocks edited, Sign clinical note

## 2016-02-02 DIAGNOSIS — Z96612 Presence of left artificial shoulder joint: Secondary | ICD-10-CM | POA: Diagnosis not present

## 2016-02-02 DIAGNOSIS — Z471 Aftercare following joint replacement surgery: Secondary | ICD-10-CM | POA: Diagnosis not present

## 2016-02-07 DIAGNOSIS — M25512 Pain in left shoulder: Secondary | ICD-10-CM | POA: Diagnosis not present

## 2016-02-07 DIAGNOSIS — Z96612 Presence of left artificial shoulder joint: Secondary | ICD-10-CM | POA: Diagnosis not present

## 2016-02-09 DIAGNOSIS — M25512 Pain in left shoulder: Secondary | ICD-10-CM | POA: Diagnosis not present

## 2016-02-09 DIAGNOSIS — Z96612 Presence of left artificial shoulder joint: Secondary | ICD-10-CM | POA: Diagnosis not present

## 2016-02-13 DIAGNOSIS — Z96612 Presence of left artificial shoulder joint: Secondary | ICD-10-CM | POA: Diagnosis not present

## 2016-02-13 DIAGNOSIS — M25512 Pain in left shoulder: Secondary | ICD-10-CM | POA: Diagnosis not present

## 2016-02-15 DIAGNOSIS — M25512 Pain in left shoulder: Secondary | ICD-10-CM | POA: Diagnosis not present

## 2016-02-15 DIAGNOSIS — Z96612 Presence of left artificial shoulder joint: Secondary | ICD-10-CM | POA: Diagnosis not present

## 2016-02-17 DIAGNOSIS — Z96612 Presence of left artificial shoulder joint: Secondary | ICD-10-CM | POA: Diagnosis not present

## 2016-02-17 DIAGNOSIS — M25512 Pain in left shoulder: Secondary | ICD-10-CM | POA: Diagnosis not present

## 2016-02-20 DIAGNOSIS — Z96612 Presence of left artificial shoulder joint: Secondary | ICD-10-CM | POA: Diagnosis not present

## 2016-02-20 DIAGNOSIS — M25512 Pain in left shoulder: Secondary | ICD-10-CM | POA: Diagnosis not present

## 2016-02-20 DIAGNOSIS — M25511 Pain in right shoulder: Secondary | ICD-10-CM | POA: Diagnosis not present

## 2016-02-20 DIAGNOSIS — M19011 Primary osteoarthritis, right shoulder: Secondary | ICD-10-CM | POA: Diagnosis not present

## 2016-02-22 DIAGNOSIS — Z96612 Presence of left artificial shoulder joint: Secondary | ICD-10-CM | POA: Diagnosis not present

## 2016-02-22 DIAGNOSIS — M25512 Pain in left shoulder: Secondary | ICD-10-CM | POA: Diagnosis not present

## 2016-02-28 DIAGNOSIS — Z96612 Presence of left artificial shoulder joint: Secondary | ICD-10-CM | POA: Diagnosis not present

## 2016-02-28 DIAGNOSIS — M25512 Pain in left shoulder: Secondary | ICD-10-CM | POA: Diagnosis not present

## 2016-03-02 DIAGNOSIS — Z96612 Presence of left artificial shoulder joint: Secondary | ICD-10-CM | POA: Diagnosis not present

## 2016-03-02 DIAGNOSIS — M19012 Primary osteoarthritis, left shoulder: Secondary | ICD-10-CM | POA: Diagnosis not present

## 2016-03-02 DIAGNOSIS — Z471 Aftercare following joint replacement surgery: Secondary | ICD-10-CM | POA: Diagnosis not present

## 2016-03-06 DIAGNOSIS — Z96612 Presence of left artificial shoulder joint: Secondary | ICD-10-CM | POA: Diagnosis not present

## 2016-03-06 DIAGNOSIS — M25512 Pain in left shoulder: Secondary | ICD-10-CM | POA: Diagnosis not present

## 2016-03-09 DIAGNOSIS — M25512 Pain in left shoulder: Secondary | ICD-10-CM | POA: Diagnosis not present

## 2016-03-09 DIAGNOSIS — Z96612 Presence of left artificial shoulder joint: Secondary | ICD-10-CM | POA: Diagnosis not present

## 2016-03-19 DIAGNOSIS — Z96612 Presence of left artificial shoulder joint: Secondary | ICD-10-CM | POA: Diagnosis not present

## 2016-03-19 DIAGNOSIS — M25512 Pain in left shoulder: Secondary | ICD-10-CM | POA: Diagnosis not present

## 2016-03-23 DIAGNOSIS — Z96612 Presence of left artificial shoulder joint: Secondary | ICD-10-CM | POA: Diagnosis not present

## 2016-03-23 DIAGNOSIS — M25512 Pain in left shoulder: Secondary | ICD-10-CM | POA: Diagnosis not present

## 2016-03-30 DIAGNOSIS — M25512 Pain in left shoulder: Secondary | ICD-10-CM | POA: Diagnosis not present

## 2016-03-30 DIAGNOSIS — Z96612 Presence of left artificial shoulder joint: Secondary | ICD-10-CM | POA: Diagnosis not present

## 2016-04-06 DIAGNOSIS — M25512 Pain in left shoulder: Secondary | ICD-10-CM | POA: Diagnosis not present

## 2016-04-06 DIAGNOSIS — Z96612 Presence of left artificial shoulder joint: Secondary | ICD-10-CM | POA: Diagnosis not present

## 2016-04-09 DIAGNOSIS — M7061 Trochanteric bursitis, right hip: Secondary | ICD-10-CM | POA: Diagnosis not present

## 2016-04-12 DIAGNOSIS — Z96612 Presence of left artificial shoulder joint: Secondary | ICD-10-CM | POA: Diagnosis not present

## 2016-04-12 DIAGNOSIS — Z471 Aftercare following joint replacement surgery: Secondary | ICD-10-CM | POA: Diagnosis not present

## 2016-04-17 DIAGNOSIS — M7061 Trochanteric bursitis, right hip: Secondary | ICD-10-CM | POA: Diagnosis not present

## 2016-04-17 DIAGNOSIS — M5416 Radiculopathy, lumbar region: Secondary | ICD-10-CM | POA: Diagnosis not present

## 2016-04-18 DIAGNOSIS — M5416 Radiculopathy, lumbar region: Secondary | ICD-10-CM | POA: Diagnosis not present

## 2016-04-18 DIAGNOSIS — M5126 Other intervertebral disc displacement, lumbar region: Secondary | ICD-10-CM | POA: Diagnosis not present

## 2016-04-20 DIAGNOSIS — M545 Low back pain: Secondary | ICD-10-CM | POA: Diagnosis not present

## 2016-04-20 DIAGNOSIS — M7061 Trochanteric bursitis, right hip: Secondary | ICD-10-CM | POA: Diagnosis not present

## 2016-04-20 DIAGNOSIS — M5416 Radiculopathy, lumbar region: Secondary | ICD-10-CM | POA: Diagnosis not present

## 2016-04-24 DIAGNOSIS — M7061 Trochanteric bursitis, right hip: Secondary | ICD-10-CM | POA: Diagnosis not present

## 2016-04-24 DIAGNOSIS — M5416 Radiculopathy, lumbar region: Secondary | ICD-10-CM | POA: Diagnosis not present

## 2016-04-24 DIAGNOSIS — M545 Low back pain: Secondary | ICD-10-CM | POA: Diagnosis not present

## 2016-04-26 DIAGNOSIS — M545 Low back pain: Secondary | ICD-10-CM | POA: Diagnosis not present

## 2016-04-26 DIAGNOSIS — M7061 Trochanteric bursitis, right hip: Secondary | ICD-10-CM | POA: Diagnosis not present

## 2016-04-26 DIAGNOSIS — M5416 Radiculopathy, lumbar region: Secondary | ICD-10-CM | POA: Diagnosis not present

## 2016-04-27 DIAGNOSIS — M47817 Spondylosis without myelopathy or radiculopathy, lumbosacral region: Secondary | ICD-10-CM | POA: Diagnosis not present

## 2016-04-27 DIAGNOSIS — M7061 Trochanteric bursitis, right hip: Secondary | ICD-10-CM | POA: Diagnosis not present

## 2016-04-27 DIAGNOSIS — M5416 Radiculopathy, lumbar region: Secondary | ICD-10-CM | POA: Diagnosis not present

## 2016-04-30 DIAGNOSIS — M4316 Spondylolisthesis, lumbar region: Secondary | ICD-10-CM | POA: Diagnosis not present

## 2016-04-30 DIAGNOSIS — M5416 Radiculopathy, lumbar region: Secondary | ICD-10-CM | POA: Diagnosis not present

## 2016-05-01 DIAGNOSIS — Z1231 Encounter for screening mammogram for malignant neoplasm of breast: Secondary | ICD-10-CM | POA: Diagnosis not present

## 2016-05-02 DIAGNOSIS — M545 Low back pain: Secondary | ICD-10-CM | POA: Diagnosis not present

## 2016-05-02 DIAGNOSIS — M5416 Radiculopathy, lumbar region: Secondary | ICD-10-CM | POA: Diagnosis not present

## 2016-05-02 DIAGNOSIS — M7061 Trochanteric bursitis, right hip: Secondary | ICD-10-CM | POA: Diagnosis not present

## 2016-05-07 ENCOUNTER — Other Ambulatory Visit: Payer: Self-pay | Admitting: Neurological Surgery

## 2016-05-07 DIAGNOSIS — M5416 Radiculopathy, lumbar region: Secondary | ICD-10-CM

## 2016-05-14 ENCOUNTER — Ambulatory Visit
Admission: RE | Admit: 2016-05-14 | Discharge: 2016-05-14 | Disposition: A | Discharge status: Home / Self Care | Payer: Medicare Other | Source: Ambulatory Visit | Attending: Neurological Surgery | Admitting: Neurological Surgery

## 2016-05-14 ENCOUNTER — Other Ambulatory Visit: Payer: Self-pay | Admitting: Neurological Surgery

## 2016-05-14 DIAGNOSIS — M5416 Radiculopathy, lumbar region: Secondary | ICD-10-CM

## 2016-05-14 DIAGNOSIS — M5126 Other intervertebral disc displacement, lumbar region: Secondary | ICD-10-CM | POA: Diagnosis not present

## 2016-05-14 MED ORDER — IOPAMIDOL (ISOVUE-M 200) INJECTION 41%
1.0000 mL | Freq: Once | INTRAMUSCULAR | Status: AC
  Administered 2016-05-14: 1 mL via EPIDURAL

## 2016-05-14 MED ORDER — METHYLPREDNISOLONE ACETATE 40 MG/ML INJ SUSP (RADIOLOG
120.0000 mg | Freq: Once | INTRAMUSCULAR | Status: AC
  Administered 2016-05-14: 120 mg via EPIDURAL

## 2016-05-14 NOTE — Discharge Instructions (Signed)

## 2016-05-18 DIAGNOSIS — H25813 Combined forms of age-related cataract, bilateral: Secondary | ICD-10-CM | POA: Diagnosis not present

## 2016-06-04 DIAGNOSIS — M4316 Spondylolisthesis, lumbar region: Secondary | ICD-10-CM | POA: Diagnosis not present

## 2016-06-11 DIAGNOSIS — G8929 Other chronic pain: Secondary | ICD-10-CM | POA: Diagnosis not present

## 2016-06-11 DIAGNOSIS — E669 Obesity, unspecified: Secondary | ICD-10-CM | POA: Diagnosis not present

## 2016-06-11 DIAGNOSIS — M4316 Spondylolisthesis, lumbar region: Secondary | ICD-10-CM | POA: Diagnosis not present

## 2016-06-11 DIAGNOSIS — M25511 Pain in right shoulder: Secondary | ICD-10-CM | POA: Diagnosis not present

## 2016-06-11 DIAGNOSIS — M5416 Radiculopathy, lumbar region: Secondary | ICD-10-CM | POA: Diagnosis not present

## 2016-06-11 DIAGNOSIS — M19019 Primary osteoarthritis, unspecified shoulder: Secondary | ICD-10-CM | POA: Diagnosis not present

## 2016-06-11 DIAGNOSIS — M545 Low back pain: Secondary | ICD-10-CM | POA: Diagnosis not present

## 2016-06-11 DIAGNOSIS — I10 Essential (primary) hypertension: Secondary | ICD-10-CM | POA: Diagnosis not present

## 2016-06-29 ENCOUNTER — Other Ambulatory Visit: Payer: Self-pay | Admitting: Neurological Surgery

## 2016-07-04 DIAGNOSIS — E785 Hyperlipidemia, unspecified: Secondary | ICD-10-CM | POA: Diagnosis not present

## 2016-07-04 DIAGNOSIS — E559 Vitamin D deficiency, unspecified: Secondary | ICD-10-CM | POA: Diagnosis not present

## 2016-07-04 DIAGNOSIS — I10 Essential (primary) hypertension: Secondary | ICD-10-CM | POA: Diagnosis not present

## 2016-07-04 DIAGNOSIS — Z87898 Personal history of other specified conditions: Secondary | ICD-10-CM | POA: Diagnosis not present

## 2016-07-04 DIAGNOSIS — E039 Hypothyroidism, unspecified: Secondary | ICD-10-CM | POA: Diagnosis not present

## 2016-07-11 ENCOUNTER — Encounter (HOSPITAL_COMMUNITY)
Admission: RE | Admit: 2016-07-11 | Discharge: 2016-07-11 | Disposition: A | Discharge status: Home / Self Care | Payer: Medicare Other | Source: Ambulatory Visit | Attending: Neurological Surgery | Admitting: Neurological Surgery

## 2016-07-11 ENCOUNTER — Encounter (HOSPITAL_COMMUNITY): Payer: Self-pay

## 2016-07-11 ENCOUNTER — Ambulatory Visit (HOSPITAL_COMMUNITY)
Admission: RE | Admit: 2016-07-11 | Discharge: 2016-07-11 | Disposition: A | Discharge status: Home / Self Care | Payer: Medicare Other | Source: Ambulatory Visit | Attending: Neurological Surgery | Admitting: Neurological Surgery

## 2016-07-11 DIAGNOSIS — M4310 Spondylolisthesis, site unspecified: Secondary | ICD-10-CM

## 2016-07-11 DIAGNOSIS — Z01818 Encounter for other preprocedural examination: Secondary | ICD-10-CM | POA: Diagnosis not present

## 2016-07-11 HISTORY — DX: Personal history of urinary calculi: Z87.442

## 2016-07-11 HISTORY — DX: Other chronic pain: G89.29

## 2016-07-11 HISTORY — DX: Major depressive disorder, single episode, unspecified: F32.9

## 2016-07-11 HISTORY — DX: Unspecified cataract: H26.9

## 2016-07-11 HISTORY — DX: Depression, unspecified: F32.A

## 2016-07-11 HISTORY — DX: Dorsalgia, unspecified: M54.9

## 2016-07-11 HISTORY — DX: Hyperlipidemia, unspecified: E78.5

## 2016-07-11 HISTORY — DX: Personal history of colon polyps, unspecified: Z86.0100

## 2016-07-11 HISTORY — DX: Anesthesia of skin: R20.0

## 2016-07-11 HISTORY — DX: Constipation, unspecified: K59.00

## 2016-07-11 HISTORY — DX: Personal history of colonic polyps: Z86.010

## 2016-07-11 LAB — CBC WITH DIFFERENTIAL/PLATELET
Basophils Absolute: 0.1 10*3/uL (ref 0.0–0.1)
Basophils Relative: 1 %
EOS ABS: 0.2 10*3/uL (ref 0.0–0.7)
Eosinophils Relative: 3 %
HCT: 43.2 % (ref 36.0–46.0)
HEMOGLOBIN: 14.1 g/dL (ref 12.0–15.0)
Lymphocytes Relative: 27 %
Lymphs Abs: 2.4 10*3/uL (ref 0.7–4.0)
MCH: 31.8 pg (ref 26.0–34.0)
MCHC: 32.6 g/dL (ref 30.0–36.0)
MCV: 97.3 fL (ref 78.0–100.0)
MONO ABS: 0.6 10*3/uL (ref 0.1–1.0)
MONOS PCT: 7 %
NEUTROS PCT: 62 %
Neutro Abs: 5.4 10*3/uL (ref 1.7–7.7)
Platelets: 352 10*3/uL (ref 150–400)
RBC: 4.44 MIL/uL (ref 3.87–5.11)
RDW: 14 % (ref 11.5–15.5)
WBC: 8.7 10*3/uL (ref 4.0–10.5)

## 2016-07-11 LAB — SURGICAL PCR SCREEN
MRSA, PCR: NEGATIVE
Staphylococcus aureus: NEGATIVE

## 2016-07-11 LAB — TYPE AND SCREEN
ABO/RH(D): B POS
Antibody Screen: NEGATIVE

## 2016-07-11 LAB — BASIC METABOLIC PANEL
Anion gap: 12 (ref 5–15)
BUN: 15 mg/dL (ref 6–20)
CHLORIDE: 105 mmol/L (ref 101–111)
CO2: 25 mmol/L (ref 22–32)
CREATININE: 0.75 mg/dL (ref 0.44–1.00)
Calcium: 9.8 mg/dL (ref 8.9–10.3)
GFR calc non Af Amer: >60 mL/min (ref >60)
GLUCOSE: 101 mg/dL — AB (ref 65–99)
Potassium: 3.9 mmol/L (ref 3.5–5.1)
Sodium: 142 mmol/L (ref 135–145)

## 2016-07-11 LAB — ABO/RH: ABO/RH(D): B POS

## 2016-07-11 LAB — PROTIME-INR
INR: 1.01
Prothrombin Time: 13.3 seconds (ref 11.4–15.2)

## 2016-07-11 MED ORDER — CHLORHEXIDINE GLUCONATE CLOTH 2 % EX PADS: 6.0000 | MEDICATED_PAD | Freq: Once | CUTANEOUS | Status: DC

## 2016-07-11 NOTE — Progress Notes (Addendum)
Cardiologist-saw one "quite a few yrs ago". Saw Dr.Munley (2015)  Medical Md is Dr.Brad Marcello [place] in [place]  Echo and Stress test in 2015  Heart cath denies  EKG in epic from 01-11-16  CXR denies in past yr

## 2016-07-11 NOTE — Pre-Procedure Instructions (Signed)
[name]  07/11/2016      [facility] [place] [address] [place] [address] Phone: [phone] Fax: [phone]    Your procedure is scheduled on Thurs, Mar 29 @ 12:55 PM  Report to [facility] Admitting at 10:50 AM  Call this number if you have problems the morning of surgery:  [phone]   Remember:  Do not eat food or drink liquids after midnight.  Take these medicines the morning of surgery with A SIP OF WATER Synthroid(Levothyroxine)              Stop taking your Aspirin,Diclofenac along with any Vitamins or Herbal Medications a week prior to surgery. No Goody's,BC's,Aleve,Advil,Motrin,Ibuprofen,or Fish Oil.    Do not wear jewelry, make-up or nail polish.  Do not wear lotions, powders, perfumes, or deoderant.  Do not shave 48 hours prior to surgery.    Do not bring valuables to the hospital.  [facility] is not responsible for any belongings or valuables.  Contacts, dentures or bridgework may not be worn into surgery.  Leave your suitcase in the car.  After surgery it may be brought to your room.  For patients admitted to the hospital, discharge time will be determined by your treatment team.  Patients discharged the day of surgery will not be allowed to drive home.   [facility]  Before surgery, you can play an important role.  Because skin is not sterile, your skin needs to be as free of germs as possible.  You can reduce the number of germs on you skin by washing with CHG (chlorahexidine gluconate) soap before surgery.  CHG is an antiseptic cleaner which kills germs and bonds with the skin to continue killing germs even after washing.  Please DO NOT use if you have an allergy to CHG or antibacterial soaps.  If your skin becomes reddened/irritated stop using the CHG and inform your nurse when you arrive at  Short Stay.  Do not shave (including legs and underarms) for at least 48 hours prior to the first CHG shower.  You may shave your face.  Please follow these instructions carefully:   1.  Shower with CHG Soap the night before surgery and the                                morning of Surgery.  2.  If you choose to wash your hair, wash your hair first as usual with your       normal shampoo.  3.  After you shampoo, rinse your hair and body thoroughly to remove the                      Shampoo.  4.  Use CHG as you would any other liquid soap.  You can apply chg directly       to the skin and wash gently with scrungie or a clean washcloth.  5.  Apply the CHG Soap to your body ONLY FROM THE NECK DOWN.        Do not use on open wounds or open sores.  Avoid contact with your eyes,       ears, mouth and genitals (private parts).  Wash genitals (private parts)  with your normal soap.  6.  Wash thoroughly, paying special attention to the area where your surgery        will be performed.  7.  Thoroughly rinse your body with warm water from the neck down.  8.  DO NOT shower/wash with your normal soap after using and rinsing off       the CHG Soap.  9.  Pat yourself dry with a clean towel.            10.  Wear clean pajamas.            11.  Place clean sheets on your bed the night of your first shower and do not        sleep with pets.  Day of Surgery  Do not apply any lotions/deoderants the morning of surgery.  Please wear clean clothes to the hospital/surgery center.   Please read over the following fact sheets that you were given. Pain Booklet, Coughing and Deep Breathing, MRSA Information and Surgical Site Infection Prevention

## 2016-07-13 DIAGNOSIS — M25511 Pain in right shoulder: Secondary | ICD-10-CM | POA: Diagnosis not present

## 2016-07-13 DIAGNOSIS — M19011 Primary osteoarthritis, right shoulder: Secondary | ICD-10-CM | POA: Diagnosis not present

## 2016-07-19 ENCOUNTER — Inpatient Hospital Stay (HOSPITAL_COMMUNITY)
Admission: RE | Admit: 2016-07-19 | Discharge: 2016-07-20 | DRG: 455 | Disposition: A | Discharge status: Home / Self Care | Payer: Medicare Other | Source: Ambulatory Visit | Attending: Neurological Surgery | Admitting: Neurological Surgery

## 2016-07-19 ENCOUNTER — Inpatient Hospital Stay (HOSPITAL_COMMUNITY): Payer: Medicare Other | Admitting: Certified Registered Nurse Anesthetist

## 2016-07-19 ENCOUNTER — Encounter (HOSPITAL_COMMUNITY): Payer: Self-pay | Admitting: *Deleted

## 2016-07-19 ENCOUNTER — Inpatient Hospital Stay (HOSPITAL_COMMUNITY): Payer: Medicare Other

## 2016-07-19 ENCOUNTER — Encounter (HOSPITAL_COMMUNITY)
Admission: RE | Disposition: A | Discharge status: Home / Self Care | Payer: Self-pay | Source: Ambulatory Visit | Attending: Neurological Surgery

## 2016-07-19 DIAGNOSIS — M1990 Unspecified osteoarthritis, unspecified site: Secondary | ICD-10-CM | POA: Diagnosis present

## 2016-07-19 DIAGNOSIS — Z79899 Other long term (current) drug therapy: Secondary | ICD-10-CM | POA: Diagnosis not present

## 2016-07-19 DIAGNOSIS — M5136 Other intervertebral disc degeneration, lumbar region: Secondary | ICD-10-CM | POA: Diagnosis present

## 2016-07-19 DIAGNOSIS — Z7982 Long term (current) use of aspirin: Secondary | ICD-10-CM | POA: Diagnosis not present

## 2016-07-19 DIAGNOSIS — G9619 Other disorders of meninges, not elsewhere classified: Secondary | ICD-10-CM | POA: Diagnosis present

## 2016-07-19 DIAGNOSIS — M4316 Spondylolisthesis, lumbar region: Secondary | ICD-10-CM | POA: Diagnosis present

## 2016-07-19 DIAGNOSIS — E039 Hypothyroidism, unspecified: Secondary | ICD-10-CM | POA: Diagnosis present

## 2016-07-19 DIAGNOSIS — I10 Essential (primary) hypertension: Secondary | ICD-10-CM | POA: Diagnosis present

## 2016-07-19 DIAGNOSIS — Z90710 Acquired absence of both cervix and uterus: Secondary | ICD-10-CM | POA: Diagnosis not present

## 2016-07-19 DIAGNOSIS — E785 Hyperlipidemia, unspecified: Secondary | ICD-10-CM | POA: Diagnosis present

## 2016-07-19 DIAGNOSIS — M48061 Spinal stenosis, lumbar region without neurogenic claudication: Secondary | ICD-10-CM | POA: Diagnosis present

## 2016-07-19 DIAGNOSIS — K5900 Constipation, unspecified: Secondary | ICD-10-CM | POA: Diagnosis present

## 2016-07-19 DIAGNOSIS — Z981 Arthrodesis status: Secondary | ICD-10-CM

## 2016-07-19 DIAGNOSIS — Z96612 Presence of left artificial shoulder joint: Secondary | ICD-10-CM | POA: Diagnosis present

## 2016-07-19 DIAGNOSIS — M4326 Fusion of spine, lumbar region: Secondary | ICD-10-CM | POA: Diagnosis not present

## 2016-07-19 DIAGNOSIS — Z9889 Other specified postprocedural states: Secondary | ICD-10-CM | POA: Diagnosis not present

## 2016-07-19 DIAGNOSIS — Z419 Encounter for procedure for purposes other than remedying health state, unspecified: Secondary | ICD-10-CM

## 2016-07-19 HISTORY — PX: MAXIMUM ACCESS (MAS)POSTERIOR LUMBAR INTERBODY FUSION (PLIF) 1 LEVEL: SHX6368

## 2016-07-19 SURGERY — FOR MAXIMUM ACCESS (MAS) POSTERIOR LUMBAR INTERBODY FUSION (PLIF) 1 LEVEL
Anesthesia: General | Site: Spine Lumbar

## 2016-07-19 MED ORDER — POTASSIUM CHLORIDE IN NACL 20-0.9 MEQ/L-% IV SOLN
INTRAVENOUS | Status: DC
  Administered 2016-07-19: 21:00:00 via INTRAVENOUS
  Filled 2016-07-19 (×2): qty 1000

## 2016-07-19 MED ORDER — DIAZEPAM 5 MG/ML IJ SOLN
2.5000 mg | Freq: Once | INTRAMUSCULAR | Status: AC
  Administered 2016-07-19: 2.5 mg via INTRAVENOUS

## 2016-07-19 MED ORDER — PROPOFOL 10 MG/ML IV BOLUS
INTRAVENOUS | Status: AC
  Filled 2016-07-19: qty 20

## 2016-07-19 MED ORDER — VANCOMYCIN HCL 1000 MG IV SOLR
INTRAVENOUS | Status: AC
  Filled 2016-07-19: qty 1000

## 2016-07-19 MED ORDER — ONDANSETRON HCL 4 MG/2ML IJ SOLN
INTRAMUSCULAR | Status: AC
  Filled 2016-07-19: qty 2

## 2016-07-19 MED ORDER — FENTANYL CITRATE (PF) 250 MCG/5ML IJ SOLN
INTRAMUSCULAR | Status: AC
  Filled 2016-07-19: qty 5

## 2016-07-19 MED ORDER — THROMBIN 20000 UNITS EX SOLR
CUTANEOUS | Status: AC
  Filled 2016-07-19: qty 20000

## 2016-07-19 MED ORDER — METHOCARBAMOL 500 MG PO TABS
ORAL_TABLET | ORAL | Status: AC
  Filled 2016-07-19: qty 1

## 2016-07-19 MED ORDER — DOCUSATE SODIUM 100 MG PO CAPS
100.0000 mg | ORAL_CAPSULE | Freq: Every day | ORAL | Status: DC
  Administered 2016-07-19 – 2016-07-20 (×2): 100 mg via ORAL
  Filled 2016-07-19 (×2): qty 1

## 2016-07-19 MED ORDER — ALBUMIN HUMAN 5 % IV SOLN
INTRAVENOUS | Status: DC | PRN
  Administered 2016-07-19: 14:00:00 via INTRAVENOUS

## 2016-07-19 MED ORDER — BUPIVACAINE HCL (PF) 0.25 % IJ SOLN
INTRAMUSCULAR | Status: DC | PRN
  Administered 2016-07-19: 7 mL

## 2016-07-19 MED ORDER — MIDAZOLAM HCL 5 MG/5ML IJ SOLN
INTRAMUSCULAR | Status: DC | PRN
Start: 2016-07-19 — End: 2016-07-19
  Administered 2016-07-19: 2 mg via INTRAVENOUS

## 2016-07-19 MED ORDER — SODIUM CHLORIDE 0.9 % IV SOLN
250.0000 mL | INTRAVENOUS | Status: DC
  Administered 2016-07-19: 250 mL via INTRAVENOUS

## 2016-07-19 MED ORDER — METHOCARBAMOL 500 MG PO TABS
500.0000 mg | ORAL_TABLET | Freq: Four times a day (QID) | ORAL | Status: DC | PRN
  Administered 2016-07-19 – 2016-07-20 (×2): 500 mg via ORAL
  Filled 2016-07-19 (×3): qty 1

## 2016-07-19 MED ORDER — PHENOL 1.4 % MT LIQD: 1.0000 | OROMUCOSAL | Status: DC | PRN

## 2016-07-19 MED ORDER — 0.9 % SODIUM CHLORIDE (POUR BTL) OPTIME
TOPICAL | Status: DC | PRN
  Administered 2016-07-19: 1000 mL

## 2016-07-19 MED ORDER — THROMBIN 5000 UNITS EX SOLR
CUTANEOUS | Status: DC | PRN
  Administered 2016-07-19: 5 mL via TOPICAL

## 2016-07-19 MED ORDER — SODIUM CHLORIDE 0.9 % IR SOLN
Status: DC | PRN
  Administered 2016-07-19: 500 mL

## 2016-07-19 MED ORDER — CEFAZOLIN SODIUM-DEXTROSE 2-4 GM/100ML-% IV SOLN
2.0000 g | INTRAVENOUS | Status: AC
  Administered 2016-07-19: 2 g via INTRAVENOUS
  Filled 2016-07-19: qty 100

## 2016-07-19 MED ORDER — HYDROMORPHONE HCL 1 MG/ML IJ SOLN
0.2500 mg | INTRAMUSCULAR | Status: DC | PRN
  Administered 2016-07-19: 0.5 mg via INTRAVENOUS

## 2016-07-19 MED ORDER — METHOCARBAMOL 1000 MG/10ML IJ SOLN
500.0000 mg | Freq: Four times a day (QID) | INTRAVENOUS | Status: DC | PRN
  Filled 2016-07-19: qty 5

## 2016-07-19 MED ORDER — ONDANSETRON HCL 4 MG PO TABS: 4.0000 mg | ORAL_TABLET | Freq: Four times a day (QID) | ORAL | Status: DC | PRN

## 2016-07-19 MED ORDER — ACETAMINOPHEN 650 MG RE SUPP: 650.0000 mg | RECTAL | Status: DC | PRN

## 2016-07-19 MED ORDER — BUPIVACAINE HCL (PF) 0.25 % IJ SOLN
INTRAMUSCULAR | Status: AC
  Filled 2016-07-19: qty 30

## 2016-07-19 MED ORDER — DEXAMETHASONE SODIUM PHOSPHATE 10 MG/ML IJ SOLN
10.0000 mg | INTRAMUSCULAR | Status: AC
  Administered 2016-07-19: 10 mg via INTRAVENOUS
  Filled 2016-07-19: qty 1

## 2016-07-19 MED ORDER — MENTHOL 3 MG MT LOZG
1.0000 | LOZENGE | OROMUCOSAL | Status: DC | PRN
Start: 2016-07-19 — End: 2016-07-20

## 2016-07-19 MED ORDER — OXYCODONE HCL 5 MG PO TABS
5.0000 mg | ORAL_TABLET | ORAL | Status: DC | PRN
  Administered 2016-07-19: 10 mg via ORAL

## 2016-07-19 MED ORDER — LOSARTAN POTASSIUM 50 MG PO TABS
25.0000 mg | ORAL_TABLET | Freq: Every day | ORAL | Status: DC
  Administered 2016-07-19 – 2016-07-20 (×2): 25 mg via ORAL
  Filled 2016-07-19 (×2): qty 1

## 2016-07-19 MED ORDER — MIDAZOLAM HCL 2 MG/2ML IJ SOLN
INTRAMUSCULAR | Status: AC
  Filled 2016-07-19: qty 2

## 2016-07-19 MED ORDER — DEXTROSE 5 % IV SOLN
INTRAVENOUS | Status: DC | PRN
  Administered 2016-07-19: 25 ug/min via INTRAVENOUS

## 2016-07-19 MED ORDER — PHENYLEPHRINE HCL 10 MG/ML IJ SOLN
INTRAMUSCULAR | Status: DC | PRN
  Administered 2016-07-19: 80 ug via INTRAVENOUS

## 2016-07-19 MED ORDER — ASPIRIN EC 81 MG PO TBEC
81.0000 mg | DELAYED_RELEASE_TABLET | ORAL | Status: DC
  Administered 2016-07-19: 81 mg via ORAL
  Filled 2016-07-19: qty 1

## 2016-07-19 MED ORDER — ESTRADIOL 0.1 MG/GM VA CREA: 1.0000 | TOPICAL_CREAM | VAGINAL | Status: DC

## 2016-07-19 MED ORDER — ONDANSETRON HCL 4 MG/2ML IJ SOLN
INTRAMUSCULAR | Status: DC | PRN
  Administered 2016-07-19 (×2): 4 mg via INTRAVENOUS

## 2016-07-19 MED ORDER — VANCOMYCIN HCL 1000 MG IV SOLR
INTRAVENOUS | Status: DC | PRN
  Administered 2016-07-19: 1000 mg via TOPICAL

## 2016-07-19 MED ORDER — PROPOFOL 10 MG/ML IV BOLUS
INTRAVENOUS | Status: DC | PRN
  Administered 2016-07-19: 200 mg via INTRAVENOUS
  Administered 2016-07-19: 50 mg via INTRAVENOUS

## 2016-07-19 MED ORDER — FENTANYL CITRATE (PF) 100 MCG/2ML IJ SOLN
INTRAMUSCULAR | Status: DC | PRN
  Administered 2016-07-19: 50 ug via INTRAVENOUS
  Administered 2016-07-19: 150 ug via INTRAVENOUS
  Administered 2016-07-19 (×2): 50 ug via INTRAVENOUS

## 2016-07-19 MED ORDER — CELECOXIB 200 MG PO CAPS
200.0000 mg | ORAL_CAPSULE | Freq: Two times a day (BID) | ORAL | Status: DC
  Administered 2016-07-19 – 2016-07-20 (×2): 200 mg via ORAL
  Filled 2016-07-19 (×2): qty 1

## 2016-07-19 MED ORDER — LIDOCAINE HCL (CARDIAC) 20 MG/ML IV SOLN
INTRAVENOUS | Status: DC | PRN
  Administered 2016-07-19: 100 mg via INTRAVENOUS

## 2016-07-19 MED ORDER — THROMBIN 5000 UNITS EX SOLR
CUTANEOUS | Status: AC
  Filled 2016-07-19: qty 5000

## 2016-07-19 MED ORDER — ACETAMINOPHEN 325 MG PO TABS
650.0000 mg | ORAL_TABLET | ORAL | Status: DC | PRN
  Administered 2016-07-19 – 2016-07-20 (×4): 650 mg via ORAL
  Filled 2016-07-19 (×4): qty 2

## 2016-07-19 MED ORDER — SODIUM CHLORIDE 0.9% FLUSH
3.0000 mL | Freq: Two times a day (BID) | INTRAVENOUS | Status: DC
  Administered 2016-07-19 – 2016-07-20 (×2): 3 mL via INTRAVENOUS

## 2016-07-19 MED ORDER — LEVOTHYROXINE SODIUM 50 MCG PO TABS
50.0000 ug | ORAL_TABLET | Freq: Every day | ORAL | Status: DC
  Administered 2016-07-20: 50 ug via ORAL
  Filled 2016-07-19: qty 1

## 2016-07-19 MED ORDER — GLYCOPYRROLATE 0.2 MG/ML IJ SOLN
INTRAMUSCULAR | Status: DC | PRN
  Administered 2016-07-19: 0.2 mg via INTRAVENOUS

## 2016-07-19 MED ORDER — SODIUM CHLORIDE 0.9% FLUSH: 3.0000 mL | INTRAVENOUS | Status: DC | PRN

## 2016-07-19 MED ORDER — ONDANSETRON HCL 4 MG/2ML IJ SOLN
4.0000 mg | Freq: Four times a day (QID) | INTRAMUSCULAR | Status: DC | PRN
Start: 2016-07-19 — End: 2016-07-20
  Administered 2016-07-20: 4 mg via INTRAVENOUS
  Filled 2016-07-19: qty 2

## 2016-07-19 MED ORDER — SENNA 8.6 MG PO TABS
1.0000 | ORAL_TABLET | Freq: Two times a day (BID) | ORAL | Status: DC
  Administered 2016-07-19 – 2016-07-20 (×2): 8.6 mg via ORAL
  Filled 2016-07-19 (×2): qty 1

## 2016-07-19 MED ORDER — HYDROMORPHONE HCL 1 MG/ML IJ SOLN
INTRAMUSCULAR | Status: AC
  Filled 2016-07-19: qty 0.5

## 2016-07-19 MED ORDER — SUCCINYLCHOLINE CHLORIDE 20 MG/ML IJ SOLN
INTRAMUSCULAR | Status: DC | PRN
  Administered 2016-07-19: 100 mg via INTRAVENOUS

## 2016-07-19 MED ORDER — DIAZEPAM 5 MG/ML IJ SOLN
INTRAMUSCULAR | Status: AC
  Filled 2016-07-19: qty 2

## 2016-07-19 MED ORDER — OXYCODONE HCL 5 MG PO TABS
ORAL_TABLET | ORAL | Status: AC
  Filled 2016-07-19: qty 2

## 2016-07-19 MED ORDER — MORPHINE SULFATE (PF) 2 MG/ML IV SOLN
2.0000 mg | INTRAVENOUS | Status: DC | PRN
  Administered 2016-07-20: 2 mg via INTRAVENOUS
  Filled 2016-07-19: qty 1

## 2016-07-19 MED ORDER — CEFAZOLIN SODIUM-DEXTROSE 2-4 GM/100ML-% IV SOLN
2.0000 g | Freq: Three times a day (TID) | INTRAVENOUS | Status: AC
  Administered 2016-07-19 – 2016-07-20 (×2): 2 g via INTRAVENOUS
  Filled 2016-07-19 (×2): qty 100

## 2016-07-19 MED ORDER — LACTATED RINGERS IV SOLN
INTRAVENOUS | Status: DC
  Administered 2016-07-19 (×3): via INTRAVENOUS

## 2016-07-19 MED ORDER — THROMBIN 20000 UNITS EX SOLR
CUTANEOUS | Status: DC | PRN
  Administered 2016-07-19: 20 mL via TOPICAL

## 2016-07-19 SURGICAL SUPPLY — 76 items
ADH SKN CLS APL DERMABOND .7 (GAUZE/BANDAGES/DRESSINGS) ×1
BAG DECANTER FOR FLEXI CONT (MISCELLANEOUS) ×4 IMPLANT
BASKET BONE COLLECTION (BASKET) ×3 IMPLANT
BENZOIN TINCTURE PRP APPL 2/3 (GAUZE/BANDAGES/DRESSINGS) ×4 IMPLANT
BLADE CLIPPER SURG (BLADE) IMPLANT
BONE CANC CHIPS 40CC CAN1/2 (Bone Implant) ×4 IMPLANT
BUR MATCHSTICK NEURO 3.0 LAGG (BURR) ×4 IMPLANT
CAGE COROENT MP 8X23 (Cage) ×16 IMPLANT
CANISTER SUCT 3000ML PPV (MISCELLANEOUS) ×4 IMPLANT
CAP RELINE MOD TULIP RMM (Cap) ×8 IMPLANT
CARTRIDGE OIL MAESTRO DRILL (MISCELLANEOUS) ×2 IMPLANT
CHIPS CANC BONE 40CC CAN1/2 (Bone Implant) ×2 IMPLANT
CLIP NEUROVISION LG (CLIP) ×4 IMPLANT
CLOSURE WOUND 1/2 X4 (GAUZE/BANDAGES/DRESSINGS) ×2
CONT SPEC 4OZ CLIKSEAL STRL BL (MISCELLANEOUS) ×4 IMPLANT
COVER BACK TABLE 24X17X13 BIG (DRAPES) IMPLANT
COVER BACK TABLE 60X90IN (DRAPES) ×4 IMPLANT
DERMABOND ADVANCED (GAUZE/BANDAGES/DRESSINGS) ×2
DERMABOND ADVANCED .7 DNX12 (GAUZE/BANDAGES/DRESSINGS) ×2 IMPLANT
DIFFUSER DRILL AIR PNEUMATIC (MISCELLANEOUS) ×4 IMPLANT
DRAPE C-ARM 42X72 X-RAY (DRAPES) ×4 IMPLANT
DRAPE C-ARMOR (DRAPES) ×4 IMPLANT
DRAPE LAPAROTOMY 100X72X124 (DRAPES) ×4 IMPLANT
DRAPE POUCH INSTRU U-SHP 10X18 (DRAPES) ×4 IMPLANT
DRAPE SURG 17X23 STRL (DRAPES) ×4 IMPLANT
DRSG OPSITE POSTOP 4X6 (GAUZE/BANDAGES/DRESSINGS) ×4 IMPLANT
DURAPREP 26ML APPLICATOR (WOUND CARE) ×4 IMPLANT
ELECT REM PT RETURN 9FT ADLT (ELECTROSURGICAL) ×4
ELECTRODE REM PT RTRN 9FT ADLT (ELECTROSURGICAL) ×2 IMPLANT
EVACUATOR 1/8 PVC DRAIN (DRAIN) IMPLANT
GAUZE SPONGE 4X4 16PLY XRAY LF (GAUZE/BANDAGES/DRESSINGS) IMPLANT
GLOVE BIO SURGEON STRL SZ 6.5 (GLOVE) ×6 IMPLANT
GLOVE BIO SURGEON STRL SZ7 (GLOVE) IMPLANT
GLOVE BIO SURGEON STRL SZ8 (GLOVE) ×8 IMPLANT
GLOVE BIO SURGEONS STRL SZ 6.5 (GLOVE) ×2
GLOVE BIOGEL PI IND STRL 6.5 (GLOVE) ×2 IMPLANT
GLOVE BIOGEL PI IND STRL 7.0 (GLOVE) IMPLANT
GLOVE BIOGEL PI IND STRL 7.5 (GLOVE) ×2 IMPLANT
GLOVE BIOGEL PI INDICATOR 6.5 (GLOVE) ×2
GLOVE BIOGEL PI INDICATOR 7.0 (GLOVE)
GLOVE BIOGEL PI INDICATOR 7.5 (GLOVE) ×2
GLOVE SS BIOGEL STRL SZ 7 (GLOVE) ×2 IMPLANT
GLOVE SUPERSENSE BIOGEL SZ 7 (GLOVE) ×2
GLOVE SURG SS PI 7.5 STRL IVOR (GLOVE) ×8 IMPLANT
GOWN STRL REUS W/ TWL LRG LVL3 (GOWN DISPOSABLE) ×6 IMPLANT
GOWN STRL REUS W/ TWL XL LVL3 (GOWN DISPOSABLE) ×4 IMPLANT
GOWN STRL REUS W/TWL 2XL LVL3 (GOWN DISPOSABLE) IMPLANT
GOWN STRL REUS W/TWL LRG LVL3 (GOWN DISPOSABLE) ×6
GOWN STRL REUS W/TWL XL LVL3 (GOWN DISPOSABLE) ×4
HEMOSTAT POWDER KIT SURGIFOAM (HEMOSTASIS) ×4 IMPLANT
KIT BASIN OR (CUSTOM PROCEDURE TRAY) ×4 IMPLANT
KIT ROOM TURNOVER OR (KITS) ×4 IMPLANT
MARKER SKIN DUAL TIP RULER LAB (MISCELLANEOUS) ×4 IMPLANT
MODULE NVM5 NEXT GEN EMG (NEEDLE) ×4 IMPLANT
NEEDLE HYPO 25X1 1.5 SAFETY (NEEDLE) ×4 IMPLANT
NS IRRIG 1000ML POUR BTL (IV SOLUTION) ×4 IMPLANT
OIL CARTRIDGE MAESTRO DRILL (MISCELLANEOUS) ×4
PACK LAMINECTOMY NEURO (CUSTOM PROCEDURE TRAY) ×4 IMPLANT
PAD ARMBOARD 7.5X6 YLW CONV (MISCELLANEOUS) ×20 IMPLANT
PATTIES SURGICAL .5 X.5 (GAUZE/BANDAGES/DRESSINGS) ×4 IMPLANT
ROD RELINE COCR 5.0X60MM (Rod) ×3 IMPLANT
ROD RELINE COCR LORD 5.0X65 (Rod) ×4 IMPLANT
SCREW LOCK RSS 4.5/5.0MM (Screw) ×24 IMPLANT
SCREW POLY RMM 5.0X40MM 4S (Screw) ×16 IMPLANT
SCREW SHANK RELINE MOD 5.0X35 (Screw) ×8 IMPLANT
SPONGE LAP 4X18 X RAY DECT (DISPOSABLE) IMPLANT
SPONGE SURGIFOAM ABS GEL 100 (HEMOSTASIS) ×4 IMPLANT
STRIP CLOSURE SKIN 1/2X4 (GAUZE/BANDAGES/DRESSINGS) ×6 IMPLANT
SUT VIC AB 0 CT1 18XCR BRD8 (SUTURE) ×2 IMPLANT
SUT VIC AB 0 CT1 8-18 (SUTURE) ×4
SUT VIC AB 2-0 CP2 18 (SUTURE) ×4 IMPLANT
SUT VIC AB 3-0 SH 8-18 (SUTURE) ×8 IMPLANT
TOWEL GREEN STERILE (TOWEL DISPOSABLE) ×4 IMPLANT
TOWEL GREEN STERILE FF (TOWEL DISPOSABLE) ×3 IMPLANT
TRAY FOLEY W/METER SILVER 16FR (SET/KITS/TRAYS/PACK) ×4 IMPLANT
WATER STERILE IRR 1000ML POUR (IV SOLUTION) ×4 IMPLANT

## 2016-07-19 NOTE — Progress Notes (Signed)
Considerably more relaxed and less preoccupied with "the catheter" after IV diazepam/ States" I guess this pain in my back is normal after surgery". Affirmed it is and shes acknowledges it is " almost bearble" at this point

## 2016-07-19 NOTE — Anesthesia Preprocedure Evaluation (Signed)
Anesthesia Evaluation  Patient identified by MRN, date of birth, ID band Patient awake    Reviewed: Allergy & Precautions, NPO status , Patient's Chart, lab work & pertinent test results  Airway Mallampati: II       Dental   Pulmonary    breath sounds clear to auscultation       Cardiovascular hypertension,  Rhythm:Regular Rate:Normal     Neuro/Psych    GI/Hepatic negative GI ROS, Neg liver ROS,   Endo/Other  Hypothyroidism   Renal/GU Renal disease     Musculoskeletal  (+) Arthritis ,   Abdominal   Peds  Hematology   Anesthesia Other Findings   Reproductive/Obstetrics                             Anesthesia Physical Anesthesia Plan  ASA: III  Anesthesia Plan: General   Post-op Pain Management:    Induction:   Airway Management Planned: Oral ETT  Additional Equipment:   Intra-op Plan:   Post-operative Plan: Possible Post-op intubation/ventilation  Informed Consent: I have reviewed the patients History and Physical, chart, labs and discussed the procedure including the risks, benefits and alternatives for the proposed anesthesia with the patient or authorized representative who has indicated his/her understanding and acceptance.   Dental advisory given  Plan Discussed with: CRNA and Anesthesiologist  Anesthesia Plan Comments:         Anesthesia Quick Evaluation

## 2016-07-19 NOTE — Anesthesia Postprocedure Evaluation (Signed)
Anesthesia Post Note  Patient: [name]  Procedure(s) Performed: Procedure(s) (LRB): LUMBAR FOUR-FIVE MAXIMUM ACCESS (MAS) POSTERIOR LUMBAR INTERBODY FUSION (PLIF), INSTRUMENTED LUMBAR THREE-FIVE (N/A)  Patient location during evaluation: PACU Anesthesia Type: General Level of consciousness: awake Pain management: pain level controlled Vital Signs Assessment: post-procedure vital signs reviewed and stable Respiratory status: spontaneous breathing Cardiovascular status: stable Anesthetic complications: no       Last Vitals:  Vitals:   07/19/16 1623 07/19/16 1645  BP: 135/78   Pulse: 65 63  Resp: (!) 9 14  Temp:      Last Pain:  Vitals:   07/19/16 1045  TempSrc: Oral                 Hinda Lindor

## 2016-07-19 NOTE — Anesthesia Procedure Notes (Signed)
Procedure Name: Intubation Date/Time: 07/19/2016 11:52 AM Performed by: Rejeana Brock L Pre-anesthesia Checklist: Patient identified, Emergency Drugs available, Suction available and Patient being monitored Patient Re-evaluated:Patient Re-evaluated prior to inductionOxygen Delivery Method: Circle System Utilized Preoxygenation: Pre-oxygenation with 100% oxygen Intubation Type: IV induction Ventilation: Mask ventilation without difficulty Laryngoscope Size: Mac and 3 Grade View: Grade I Tube type: Oral Tube size: 7.5 mm Number of attempts: 1 Airway Equipment and Method: Stylet and Oral airway Placement Confirmation: ETT inserted through vocal cords under direct vision,  positive ETCO2 and breath sounds checked- equal and bilateral Secured at: 22 cm Tube secured with: Tape Dental Injury: Teeth and Oropharynx as per pre-operative assessment

## 2016-07-19 NOTE — Transfer of Care (Signed)
Immediate Anesthesia Transfer of Care Note  Patient: [name]  Procedure(s) Performed: Procedure(s): LUMBAR FOUR-FIVE MAXIMUM ACCESS (MAS) POSTERIOR LUMBAR INTERBODY FUSION (PLIF), INSTRUMENTED LUMBAR THREE-FIVE (N/A)  Patient Location: PACU  Anesthesia Type:General  Level of Consciousness: awake, alert  and oriented  Airway & Oxygen Therapy: Patient Spontanous Breathing and Patient connected to nasal cannula oxygen  Post-op Assessment: Report given to RN, Post -op Vital signs reviewed and stable and Patient moving all extremities X 4  Post vital signs: Reviewed and stable  Last Vitals:  Vitals:   07/19/16 1045  BP: (!) 151/84  Pulse: 75  Resp: 20  Temp: 36.8 C    Last Pain:  Vitals:   07/19/16 1045  TempSrc: Oral     HR 88, RR 14, SAts 97%, BP 144/78 Patients Stated Pain Goal: 5 (85/27/78 2423)  Complications: No apparent anesthesia complications

## 2016-07-19 NOTE — Op Note (Signed)
07/19/2016  3:33 PM  PATIENT:  [name]  71 y.o. female  PRE-OPERATIVE DIAGNOSIS:  Degenerative spondylolisthesis L4-L5 with spinal stenosis, severe degenerative disc disease L3-4 L4-5, foraminal stenosis L3-4 on the right status post right L3-4 hemilaminectomy  POST-OPERATIVE DIAGNOSIS:  same  PROCEDURE:   1. Decompressive lumbar laminectomy L34 and L4-5 requiring more work than would be required for a simple exposure of the disk for PLIF in order to adequately decompress the neural elements and address the spinal stenosis 2. Posterior lumbar interbody fusion L3-4 and L4-5 using PEEK interbody cages packed with morcellized allograft and autograft 3. Posterior fixation L3-L5 inclusive using cortical pedicle screws.  4. Intertransverse arthrodesis L3-L5 using morcellized autograft and allograft.  SURGEON:  Sherley Bounds, MD  ASSISTANTS: Dr. Cyndy Freeze  ANESTHESIA:  General  EBL: 300 ml  Total I/O In: 1550 [I.V.:1300; IV Piggyback:250] Out: 550 [Urine:250; Blood:300]  BLOOD ADMINISTERED:none  DRAINS: none   INDICATION FOR PROCEDURE: This patient presented with severe right leg pain. Imaging revealed severe spinal stenosis with spondylolisthesis L4-5 with severe foraminal stenosis L3-4 on the right and previous hemi-laminectomy at L3-4 on the right. The patient tried a reasonable attempt at conservative medical measures without relief. I recommended decompression and instrumented fusion to address the stenosis as well as the segment once stability.  Patient understood the risks, benefits, and alternatives and potential outcomes and wished to proceed.  PROCEDURE DETAILS:  The patient was brought to the operating room. After induction of generalized endotracheal anesthesia the patient was rolled into the prone position on chest rolls and all pressure points were padded. The patient's lumbar region was cleaned and then prepped with DuraPrep and draped in the usual sterile fashion.  Anesthesia was injected and then a dorsal midline incision was made and carried down to the lumbosacral fascia. The fascia was opened and the paraspinous musculature was taken down in a subperiosteal fashion to expose L3-4 and L4-5. A self-retaining retractor was placed. Intraoperative fluoroscopy confirmed my level, and I started with placement of the L3 cortical pedicle screws. The pedicle screw entry zones were identified utilizing surface landmarks and  AP and lateral fluoroscopy. I scored the cortex with the high-speed drill and then used the hand drill and EMG monitoring to drill an upward and outward direction into the pedicle. I then tapped line to line, and the tap was also monitored. I then placed a 5.0 x 35 mm cortical pedicle screw into the pedicles of L3 bilaterally. I then turned my attention to the decompression and complete lumbar laminectomies, hemi- facetectomies, and foraminotomies were performed at L3-4 and L4-5. The patient had significant spinal stenosis and this required more work than would be required for a simple exposure of the disc for posterior lumbar interbody fusion. Much more generous decompression was undertaken in order to adequately decompress the neural elements and address the patient's leg pain. The yellow ligament was removed to expose the underlying dura and nerve roots, and generous foraminotomies were performed to adequately decompress the neural elements. Both the exiting and traversing nerve roots were decompressed on both sides until a coronary dilator passed easily along the nerve roots. There was significant epidural fibrosis at L3-4 on the right. There was a large C5 within the foramen compressing the right L3 nerve root. Great care was taken to remove all of this and to decompress the neural elements. No unintended durotomy was noted. Once the decompression was complete, I turned my attention to the posterior lower lumbar interbody fusion.  The epidural venous  vasculature was coagulated and cut sharply. Disc space was incised and the initial discectomy was performed with pituitary rongeurs. The disc space was distracted with sequential distractors to a height of 8 mm at both levels. We then used a series of scrapers and shavers to prepare the endplates for fusion. The midline was prepared with Epstein curettes. Once the complete discectomy was finished, we packed an appropriate sized peek interbody cage with local autograft and morcellized allograft, gently retracted the nerve root, and tapped the cage into position at L34 and L4-5.  The midline between the cages was packed with morselized autograft and allograft. We then turned our attention to the placement of the lower pedicle screws. The pedicle screw entry zones were identified utilizing surface landmarks and fluoroscopy. I drilled into each pedicle utilizing the hand drill and EMG monitoring, and tapped each pedicle with the appropriate tap. We palpated with a ball probe to assure no break in the cortex. We then placed a 5.0 x 35 mm pedicle screws into the pedicles bilaterally at L4 and L5. We then decorticated the transverse processes and laid a mixture of morcellized autograft and allograft out over these to perform intertransverse arthrodesis at L3-4 and L4-5. We then placed lordotic rods into the multiaxial screw heads of the pedicle screws and locked these in position with the locking caps and anti-torque device. We then checked our construct with AP and lateral fluoroscopy. Irrigated with copious amounts of bacitracin-containing saline solution. Inspected the nerve roots once again to assure adequate decompression, lined to the dura with Gelfoam, and closed the muscle and the fascia with 0 Vicryl. Closed the subcutaneous tissues with 2-0 Vicryl and subcuticular tissues with 3-0 Vicryl. The skin was closed with benzoin and Steri-Strips. Dressing was then applied, the patient was awakened from general anesthesia  and transported to the recovery room in stable condition. At the end of the procedure all sponge, needle and instrument counts were correct.   PLAN OF CARE: admit to inpatient  PATIENT DISPOSITION:  PACU - hemodynamically stable.   Delay start of Pharmacological VTE agent (>24hrs) due to surgical blood loss or risk of bleeding:  yes

## 2016-07-19 NOTE — H&P (Signed)
Subjective: Patient is a 71 y.o. female admitted for PLIF. Onset of symptoms was several months ago, gradually worsening since that time.  The pain is rated severe, and is located at the across the lower back and radiates to legs. The pain is described as aching and occurs all day. The symptoms have been progressive. Symptoms are exacerbated by exercise. MRI or CT showed anterolisthesis with stenosis   Past Medical History:  Diagnosis Date  . Arthritis   . Cataracts, bilateral    immature  . Chronic back pain    spondylolisthesis  . Complication of anesthesia    unable to get up due dizziness,nausea- stayed overnite  . Constipation    takes Colace daily  . Depression    but not on any meds  . History of colon polyps    benign one time and precancerous another  . History of kidney stones 12/2015  . Hyperlipidemia    takes Pravastatin daily  . Hypertension    takes Losartan daily  . Hypothyroidism    takes Synthroid daily  . Kidney stone   . Numbness    tingling/right lower leg     Past Surgical History:  Procedure Laterality Date  . ABDOMINAL HYSTERECTOMY    . BACK SURGERY    . COLONOSCOPY    . SHOULDER SURGERY Left 2004   arthroscopy  . TOTAL SHOULDER ARTHROPLASTY Left 01/19/2016   Procedure: TOTAL SHOULDER ARTHROPLASTY;  Surgeon: Justice Britain, MD;  Location: [facility];  Service: Orthopedics;  Laterality: Left;    Prior to Admission medications   Medication Sig Start Date End Date Taking? Authorizing Provider  acetaminophen (TYLENOL) 650 MG CR tablet Take 650-1,300 mg by mouth See admin instructions. Takes 1 tablet at breakfast and 1 at lunch and 2 at bedtime   Yes Historical Provider, MD  aspirin EC 81 MG tablet Take 81 mg by mouth every other day.   Yes Historical Provider, MD  Calcium-Vitamin D-Vitamin K (VIACTIV PO) Take 2 tablets by mouth daily.   Yes Historical Provider, MD  diclofenac (VOLTAREN) 50 MG EC tablet Take 100 mg by mouth daily.    Yes Historical Provider, MD   docusate sodium (COLACE) 100 MG capsule Take 100 mg by mouth daily.   Yes Historical Provider, MD  estradiol (ESTRACE) 0.1 MG/GM vaginal cream Place 1 Applicatorful vaginally 3 (three) times a week.   Yes Historical Provider, MD  levothyroxine (SYNTHROID, LEVOTHROID) 50 MCG tablet Take 50 mcg by mouth daily before breakfast.  08/15/14  Yes Historical Provider, MD  losartan (COZAAR) 25 MG tablet TAKE 1 TABLET DAILY 09/10/14  Yes Historical Provider, MD  Multiple Vitamins-Calcium (VIACTIV MULTI-VITAMIN) CHEW Chew 1 tablet by mouth 2 (two) times daily.   Yes Historical Provider, MD  Omega-3 Fatty Acids (FISH OIL) 1200 MG CAPS Take 1,200 mg by mouth every evening.   Yes Historical Provider, MD  Pramoxine-Menthol (ANTI-ITCH MEDICATION EX) Apply 1 application topically daily as needed (itch).   Yes Historical Provider, MD  pravastatin (PRAVACHOL) 40 MG tablet Take 40 mg by mouth every evening.    Yes Historical Provider, MD  diazepam (VALIUM) 5 MG tablet Take 0.5-1 tablets (2.5-5 mg total) by mouth every 6 (six) hours as needed for muscle spasms or sedation. Patient not taking: Reported on 07/06/2016 01/20/16   Olivia Mackie Shuford, PA-C  ondansetron (ZOFRAN) 4 MG tablet Take 1 tablet (4 mg total) by mouth every 8 (eight) hours as needed for nausea or vomiting. Patient not taking: Reported on 07/06/2016  01/20/16   Olivia Mackie Shuford, PA-C  oxyCODONE-acetaminophen (PERCOCET) 5-325 MG tablet Take 1-2 tablets by mouth every 4 (four) hours as needed. Patient not taking: Reported on 07/06/2016 01/20/16   Jenetta Loges, PA-C   No Known Allergies  Social History  Substance Use Topics  . Smoking status: Never Smoker  . Smokeless tobacco: Never Used  . Alcohol use 0.0 oz/week     Comment: rarely    History reviewed. No pertinent family history.   Review of Systems  Positive ROS: neg  All other systems have been reviewed and were otherwise negative with the exception of those mentioned in the HPI and as  above.  Objective: Vital signs in last 24 hours: Temp:  [98.3 F (36.8 C)] 98.3 F (36.8 C) (03/29 1045) Pulse Rate:  [75] 75 (03/29 1045) Resp:  [20] 20 (03/29 1045) BP: (151)/(84) 151/84 (03/29 1045) SpO2:  [97 %] 97 % (03/29 1045) Weight:  [85.7 kg (189 lb)] 85.7 kg (189 lb) (03/29 1045)  General Appearance: Alert, cooperative, no distress, appears stated age Head: Normocephalic, without obvious abnormality, atraumatic Eyes: PERRL, conjunctiva/corneas clear, EOM's intact    Neck: Supple, symmetrical, trachea midline Back: Symmetric, no curvature, ROM normal, no CVA tenderness Lungs:  respirations unlabored Heart: Regular rate and rhythm Abdomen: Soft, non-tender Extremities: Extremities normal, atraumatic, no cyanosis or edema Pulses: 2+ and symmetric all extremities Skin: Skin color, texture, turgor normal, no rashes or lesions  NEUROLOGIC:   Mental status: Alert and oriented x4,  no aphasia, good attention span, fund of knowledge, and memory Motor Exam - grossly normal Sensory Exam - grossly normal Reflexes: 1+ Coordination - grossly normal Gait - grossly normal Balance - grossly normal Cranial Nerves: I: smell Not tested  II: visual acuity  OS: nl    OD: nl  II: visual fields Full to confrontation  II: pupils Equal, round, reactive to light  III,VII: ptosis None  III,IV,VI: extraocular muscles  Full ROM  V: mastication Normal  V: facial light touch sensation  Normal  V,VII: corneal reflex  Present  VII: facial muscle function - upper  Normal  VII: facial muscle function - lower Normal  VIII: hearing Not tested  IX: soft palate elevation  Normal  IX,X: gag reflex Present  XI: trapezius strength  5/5  XI: sternocleidomastoid strength 5/5  XI: neck flexion strength  5/5  XII: tongue strength  Normal    Data Review Lab Results  Component Value Date   WBC 8.7 07/11/2016   HGB 14.1 07/11/2016   HCT 43.2 07/11/2016   MCV 97.3 07/11/2016   PLT 352  07/11/2016   Lab Results  Component Value Date   NA 142 07/11/2016   K 3.9 07/11/2016   CL 105 07/11/2016   CO2 25 07/11/2016   BUN 15 07/11/2016   CREATININE 0.75 07/11/2016   GLUCOSE 101 (H) 07/11/2016   Lab Results  Component Value Date   INR 1.01 07/11/2016    Assessment/Plan: Patient admitted for PLIF. Patient has failed a reasonable attempt at conservative therapy.  I explained the condition and procedure to the patient and answered any questions.  Patient wishes to proceed with procedure as planned. Understands risks/ benefits and typical outcomes of procedure.   JONES,DAVID S 07/19/2016 10:55 AM

## 2016-07-20 MED ORDER — ONDANSETRON HCL 4 MG PO TABS: 4.0000 mg | ORAL_TABLET | Freq: Three times a day (TID) | ORAL | 0 refills | Status: DC | PRN

## 2016-07-20 MED ORDER — CARISOPRODOL 350 MG PO TABS: 350.0000 mg | ORAL_TABLET | Freq: Three times a day (TID) | ORAL | 1 refills | Status: DC | PRN

## 2016-07-20 MED ORDER — OXYCODONE-ACETAMINOPHEN 5-325 MG PO TABS: 1.0000 | ORAL_TABLET | Freq: Four times a day (QID) | ORAL | 0 refills | Status: DC | PRN

## 2016-07-20 NOTE — Care Management Note (Signed)
Case Management Note  Patient Details  Name: [name] MRN: [id number] Date of Birth: 03-06-1946  Subjective/Objective:  Pt s/p lumbar fusion. She is from home with her spouse.                   Action/Plan: Pt discharging home with self care. No f/u per PT. Pt with orders for walker and 3 in1. Pt asked to have orders for 3 in 1 and she would decide if she needed and would pick up from retail store. Orders provided. Jermaine with [facility] DME informed of walker. He will deliver the equipment to the room. Patient has transportation home.   Expected Discharge Date:  07/20/16               Expected Discharge Plan:  Home/Self Care  In-House Referral:     Discharge planning Services  CM Consult  Post Acute Care Choice:  Durable Medical Equipment Choice offered to:  Patient  DME Arranged:  3-N-1, Walker rolling (given order for 3 in 1) DME Agency:  [place]:    [place] Agency:     Status of Service:  Completed, signed off  If discussed at [place] of Stay Meetings, dates discussed:    Additional Comments:  Pollie Friar, RN 07/20/2016, 11:48 AM

## 2016-07-20 NOTE — Progress Notes (Signed)
Patient c/o discomfort of indwelling foley at 0330. Indwelling foley removed at 0337. Patient ambulated around room post foley removal and ambulated to bathroom and voided x 1 at 0350. Patient tolerated ambulation fair. Patient c/o nausea and pain of lower back; administered prescribed tylenol and Zofran. Will continue to monitor patients status.

## 2016-07-20 NOTE — Evaluation (Signed)
Occupational Therapy Evaluation Patient Details Name: [name] MRN: [id number] DOB: Jul 30, 1945 Today's Date: 07/20/2016    History of Present Illness 3-5 LUMBAR PLIF. PMH: OA, CHRONIC BACK PAIN, BACK DISCECTOMY 5 YEARS POST, DEPRESSION, HTN, L TSR 2017.   Clinical Impression   PNT. WAS EDUCATED ON BACK PRECAUTIONS AND WORKSHEET GIVEN AND DISCUSSED. PNT WAS EDUCATED ON USE OF AE AND DME AND WAS ABLE TO RETURN DEMO. PNT. IS SUPPOSED TO LEAVE TODAY. PNT. WOULD BENEFIT FROM HHOT.    Follow Up Recommendations  Home health OT    Equipment Recommendations       Recommendations for Other Services       Precautions / Restrictions Precautions Precautions: Back Precaution Booklet Issued: Yes (comment) Required Braces or Orthoses:  (BACK BRACE, REMOVE WHEN IN BED, MAY WALK TO BATHROOM WITHOUT) Restrictions Other Position/Activity Restrictions: NO BENDING ARCHING OR TWISTING.      Mobility Bed Mobility Overal bed mobility: Needs Assistance Bed Mobility: Sidelying to Sit   Sidelying to sit: Min assist       General bed mobility comments: min a with rolling to l maintaining log roll  Transfers Overall transfer level: Needs assistance Equipment used: Rolling walker (2 wheeled) Transfers: Sit to/from [facility] Sit to Stand: Supervision Stand pivot transfers: Supervision            Balance                                           ADL either performed or assessed with clinical judgement   ADL Overall ADL's : Needs assistance/impaired Eating/Feeding: Independent   Grooming: Wash/dry hands;Supervision/safety   Upper Body Bathing: Supervision/ safety;Set up;Sitting   Lower Body Bathing: Supervison/ safety;With adaptive equipment   Upper Body Dressing : Supervision/safety;Set up;Minimal assistance;Cueing for safety;Sitting   Lower Body Dressing: Minimal assistance;With adaptive equipment   Toilet Transfer:  Supervision/safety;Cueing for safety;Ambulation;BSC   Toileting- Water quality scientist and Hygiene: Supervision/safety         General ADL Comments: PATIENT WAS EDUCATED ON UISE OF AE.     Vision Baseline Vision/History: Wears glasses;Cataracts Wears Glasses: At all times Patient Visual Report: No change from baseline       Perception     Praxis      Pertinent Vitals/Pain Pain Assessment: 0-10 Pain Score: 6  Pain Location:  (BACK) Pain Descriptors / Indicators: Aching;Burning Pain Intervention(s): Premedicated before session     Hand Dominance Right   Extremity/Trunk Assessment Upper Extremity Assessment Upper Extremity Assessment: Overall WFL for tasks assessed           Communication Communication Communication: No difficulties   Cognition Arousal/Alertness: Awake/alert Behavior During Therapy: WFL for tasks assessed/performed Overall Cognitive Status: Within Functional Limits for tasks assessed                                     General Comments       Exercises     Shoulder Instructions      Home Living Family/patient expects to be discharged to:: Private residence Living Arrangements: Spouse/significant other Available Help at Discharge: Family;Available 24 hours/day Type of Home: House Home Access: Stairs to enter [employer] of Steps:  (3-4 STEPS)         Bathroom Shower/Tub: Teacher, early years/pre: Handicapped height  Home Equipment: Kasandra Knudsen - single point;Bedside commode;Shower seat;Grab bars - tub/shower          Prior Functioning/Environment Level of Independence: Independent                 OT Problem List:        OT Treatment/Interventions:      OT Goals(Current goals can be found in the care plan section) Acute Rehab OT Goals Patient Stated Goal: go home  OT Frequency:     Barriers to D/C:            Co-evaluation              End of Session Equipment Utilized  During Treatment: Gait belt;Rolling walker;Back brace  Activity Tolerance: Patient tolerated treatment well Patient left: in chair;with call bell/phone within reach;with family/visitor present                   Time: 0102-7253 OT Time Calculation (min): 63 min Charges:  OT General Charges $OT Visit: 1 Procedure OT Evaluation $OT Eval Moderate Complexity: 1 Procedure OT Treatments $Self Care/Home Management : 23-37 mins G-Codes:     6 CLICK SCORE OF 20  Danette Weinfeld 07/20/2016, 9:39 AM

## 2016-07-20 NOTE — Progress Notes (Addendum)
Physical Therapy Evaluation Patient Details Name: [name] MRN: [id number] DOB: 1945-08-22 Today's Date: 07/20/2016   History of Present Illness  3-5 LUMBAR PLIF. PMH: OA, CHRONIC BACK PAIN, BACK DISCECTOMY 5 YEARS POST, DEPRESSION, HTN, L TSR 2017.  Clinical Impression  Pt s/p surgery above with deficits below. PTA, pt was independent with all mobility. Upon evaluation, pt presenting with decreased steadiness and weakness during functional mobility tasks. Completed gait and stair training and educated about assist required at home. Recommending d/c recommendations below. Will continue to follow to maximize independence with functional mobility.     Follow Up Recommendations No PT follow up;Supervision/Assistance - 24 hour    Equipment Recommendations  Rolling walker with 5" wheels    Recommendations for Other Services       Precautions / Restrictions Precautions Precautions: Back Precaution Booklet Issued: Yes (comment) Precaution Comments: Reviewed with OT prior to session. Able to recall 3/3 precautions Required Braces or Orthoses: Spinal Brace Spinal Brace: Applied in sitting position;Lumbar corset Restrictions Weight Bearing Restrictions: No Other Position/Activity Restrictions: NO BENDING ARCHING OR TWISTING.      Mobility  Bed Mobility Overal bed mobility: Needs Assistance Bed Mobility: Sidelying to Sit   Sidelying to sit: Min assist       General bed mobility comments: In chair upon entry   Transfers Overall transfer level: Needs assistance Equipment used: Rolling walker (2 wheeled) Transfers: Sit to/from Stand Sit to Stand: Supervision Stand pivot transfers: Supervision       General transfer comment: Supervision for safety. Verbal cues for sequencing and UE placement for transfer.   Ambulation/Gait Ambulation/Gait assistance: Min guard;Supervision Ambulation Distance (Feet): 125 Feet Assistive device: Rolling walker (2 wheeled) Gait  Pattern/deviations: Step-through pattern;Decreased stride length Gait velocity: Decreased Gait velocity interpretation: Below normal speed for age/gender General Gait Details: slow, steady gait. Pt reporting decreased pain with ambulation. Verbal cues for upright posture and sequencing with RW  Stairs Stairs: Yes Stairs assistance: Min guard Stair Management: One rail Right;Step to pattern;Sideways Number of Stairs: 4 General stair comments: Verbal cues for LE sequencing and use of BUE on R rail. Min guard for safety. Education to pt and husband about appropriate assist level required on stairs.   Wheelchair Mobility    Modified Rankin (Stroke Patients Only)       Balance Overall balance assessment: Needs assistance Sitting-balance support: No upper extremity supported;Feet supported Sitting balance-Leahy Scale: Good     Standing balance support: Bilateral upper extremity supported;During functional activity Standing balance-Leahy Scale: Poor Standing balance comment: Reliant on RW.                              Pertinent Vitals/Pain Pain Assessment: 0-10 Pain Score: 4  Pain Location: Back  Pain Descriptors / Indicators: Aching;Burning Pain Intervention(s): Limited activity within patient's tolerance;Monitored during session;Repositioned    Home Living Family/patient expects to be discharged to:: Private residence Living Arrangements: Spouse/significant other Available Help at Discharge: Family;Available 24 hours/day Type of Home: House Home Access: Stairs to enter Entrance Stairs-Rails: Chemical engineer of Steps: 6 Home Layout: One level Home Equipment: Cane - single point;Bedside commode;Shower seat;Grab bars - tub/shower      Prior Function Level of Independence: Independent               Hand Dominance   Dominant Hand: Right    Extremity/Trunk Assessment   Upper Extremity Assessment Upper Extremity Assessment: Defer to  OT evaluation  Lower Extremity Assessment Lower Extremity Assessment: RLE deficits/detail;LLE deficits/detail RLE Deficits / Details: Sensory in tact. At least 3/5; not formally tested secondary to spinal surgery  LLE Deficits / Details: Sensory in tact; at least 3/5; not formally tested secondary to spinal surgery.     Cervical / Trunk Assessment Cervical / Trunk Assessment: Other exceptions Cervical / Trunk Exceptions: s/p surgery   Communication   Communication: No difficulties  Cognition Arousal/Alertness: Awake/alert Behavior During Therapy: WFL for tasks assessed/performed Overall Cognitive Status: Within Functional Limits for tasks assessed                                        General Comments General comments (skin integrity, edema, etc.): Reviewed walking exercise program with pt. Reviewed car transfer technique with pt and family. Pt requesting to be sent home with nausea medications; notified RN about request.     Exercises     Assessment/Plan    PT Assessment Patient needs continued PT services  PT Problem List Decreased strength;Decreased activity tolerance;Decreased balance;Decreased mobility;Pain       PT Treatment Interventions DME instruction;Gait training;Stair training;Functional mobility training;Therapeutic activities;Therapeutic exercise;Balance training;Cognitive remediation    PT Goals (Current goals can be found in the Care Plan section)  Acute Rehab PT Goals Patient Stated Goal: go home PT Goal Formulation: With patient Time For Goal Achievement: 07/27/16 Potential to Achieve Goals: Good    Frequency Min 5X/week   Barriers to discharge        Co-evaluation               End of Session Equipment Utilized During Treatment: Gait belt;Back brace Activity Tolerance: Patient tolerated treatment well Patient left: in chair;with call bell/phone within reach;with family/visitor present Nurse Communication: Mobility  status;Other (comment) (IV beeping ) PT Visit Diagnosis: Other abnormalities of gait and mobility (R26.89);Pain Pain - part of body:  (back )    Time: 5929-2446 PT Time Calculation (min) (ACUTE ONLY): 31 min   Charges:   PT Evaluation $PT Eval Low Complexity: 1 Procedure PT Treatments $Gait Training: 8-22 mins   PT G Codes:        Nicky Pugh, PT, DPT  Acute Rehabilitation Services  Pager: [phone]   Army Melia 07/20/2016, 10:51 AM

## 2016-07-20 NOTE — Progress Notes (Signed)
Patient arrives on unit at 1900 07/19/16. Patient immediately assessed. At this time patient with no c/o pain or sob. Oriented x 4. Husband at bedside Patient s/p PLIF. Dry and intact honeycomb dressing on mid lower back; no bleeding or hematoma noted. Site soft. Patient with foley draining clear yellow urine. Bilateral SCDs in place and functioning. Patient oriented to room and POC. Call bell within reach. Will continue to monitor patients status.

## 2016-07-20 NOTE — Discharge Summary (Signed)
Physician Discharge Summary  Patient ID: [name] MRN: [id number] DOB/AGE: 1946/03/26 71 y.o.  Admit date: 07/19/2016 Discharge date: 07/20/2016  Admission Diagnoses: spondylolisthesis/ stenosis    Discharge Diagnoses: same   Discharged Condition: good  Hospital Course: The patient was admitted on 07/19/2016 and taken to the operating room where the patient underwent PLIF. The patient tolerated the procedure well and was taken to the recovery room and then to the floor in stable condition. The hospital course was routine. There were no complications. The wound remained clean dry and intact. Pt had appropriate back soreness. No complaints of leg pain or new N/T/W. The patient remained afebrile with stable vital signs, and tolerated a regular diet. The patient continued to increase activities, and pain was well controlled with oral pain medications.   Consults: None  Significant Diagnostic Studies:  Results for orders placed or performed during the hospital encounter of 07/11/16  Surgical pcr screen  Result Value Ref Range   MRSA, PCR NEGATIVE NEGATIVE   Staphylococcus aureus NEGATIVE NEGATIVE  Basic metabolic panel  Result Value Ref Range   Sodium 142 135 - 145 mmol/L   Potassium 3.9 3.5 - 5.1 mmol/L   Chloride 105 101 - 111 mmol/L   CO2 25 22 - 32 mmol/L   Glucose, Bld 101 (H) 65 - 99 mg/dL   BUN 15 6 - 20 mg/dL   Creatinine, Ser 0.75 0.44 - 1.00 mg/dL   Calcium 9.8 8.9 - 10.3 mg/dL   GFR calc non Af Amer >60 >60 mL/min   GFR calc Af Amer >60 >60 mL/min   Anion gap 12 5 - 15  CBC WITH DIFFERENTIAL  Result Value Ref Range   WBC 8.7 4.0 - 10.5 K/uL   RBC 4.44 3.87 - 5.11 MIL/uL   Hemoglobin 14.1 12.0 - 15.0 g/dL   HCT 43.2 36.0 - 46.0 %   MCV 97.3 78.0 - 100.0 fL   MCH 31.8 26.0 - 34.0 pg   MCHC 32.6 30.0 - 36.0 g/dL   RDW 14.0 11.5 - 15.5 %   Platelets 352 150 - 400 K/uL   Neutrophils Relative % 62 %   Neutro Abs 5.4 1.7 - 7.7 K/uL   Lymphocytes Relative 27 %    Lymphs Abs 2.4 0.7 - 4.0 K/uL   Monocytes Relative 7 %   Monocytes Absolute 0.6 0.1 - 1.0 K/uL   Eosinophils Relative 3 %   Eosinophils Absolute 0.2 0.0 - 0.7 K/uL   Basophils Relative 1 %   Basophils Absolute 0.1 0.0 - 0.1 K/uL  Protime-INR  Result Value Ref Range   Prothrombin Time 13.3 11.4 - 15.2 seconds   INR 1.01   Type and screen [place]  Result Value Ref Range   ABO/RH(D) B POS    Antibody Screen NEG    Sample Expiration 07/25/2016    Extend sample reason NO TRANSFUSIONS OR PREGNANCY IN THE PAST 3 MONTHS   ABO/Rh  Result Value Ref Range   ABO/RH(D) B POS     Chest 2 View  Result Date: 07/11/2016 CLINICAL DATA:  Preoperative evaluation for upcoming lumbar surgery. EXAM: CHEST  2 VIEW COMPARISON:  None. FINDINGS: Cardiac shadow is within normal limits. The lungs are clear bilaterally. Postsurgical changes in the left shoulder are noted. No acute bony abnormality is seen. IMPRESSION: No active cardiopulmonary disease. Electronically Signed   By: Inez Catalina M.D.   On: 07/11/2016 10:38   Dg Lumbar Spine 2-3 Views  Result  Date: 07/19/2016 CLINICAL DATA:  L4-5 PLIF. EXAM: DG C-ARM 61-120 MIN; LUMBAR SPINE - 2-3 VIEW FLUOROSCOPY TIME:  77 seconds. COMPARISON:  None. FINDINGS: Two fluoroscopic spot views submitted, interpreting radiologist was not present at time of procedure. L4-5 and L5-S1 PLIF with interbody fusion device. IMPRESSION: L4-5 and L5-S1 PLIF. Electronically Signed   By: Elon Alas M.D.   On: 07/19/2016 15:29   Dg C-arm 1-60 Min  Result Date: 07/19/2016 CLINICAL DATA:  L4-5 PLIF. EXAM: DG C-ARM 61-120 MIN; LUMBAR SPINE - 2-3 VIEW FLUOROSCOPY TIME:  77 seconds. COMPARISON:  None. FINDINGS: Two fluoroscopic spot views submitted, interpreting radiologist was not present at time of procedure. L4-5 and L5-S1 PLIF with interbody fusion device. IMPRESSION: L4-5 and L5-S1 PLIF. Electronically Signed   By: Elon Alas M.D.   On: 07/19/2016  15:29    Antibiotics:  Anti-infectives    Start     Dose/Rate Route Frequency Ordered Stop   07/19/16 1930  ceFAZolin (ANCEF) IVPB 2g/100 mL premix     2 g 200 mL/hr over 30 Minutes Intravenous Every 8 hours 07/19/16 1925 07/20/16 0402   07/19/16 1509  vancomycin (VANCOCIN) powder  Status:  Discontinued       As needed 07/19/16 1509 07/19/16 1533   07/19/16 1315  bacitracin 50,000 Units in sodium chloride irrigation 0.9 % 500 mL irrigation  Status:  Discontinued       As needed 07/19/16 1315 07/19/16 1533   07/19/16 1023  ceFAZolin (ANCEF) IVPB 2g/100 mL premix     2 g 200 mL/hr over 30 Minutes Intravenous On call to O.R. 07/19/16 1023 07/19/16 1210      Discharge Exam: Blood pressure 116/72, pulse 76, temperature 97.9 F (36.6 C), temperature source Oral, resp. rate 18, height '5\' 6"'$  (1.676 m), weight 87.4 kg (192 lb 11.2 oz), SpO2 99 %. Neurologic: Grossly normal Dressing dry  Discharge Medications:   Allergies as of 07/20/2016   No Known Allergies     Medication List    STOP taking these medications   diazepam 5 MG tablet Commonly known as:  VALIUM     TAKE these medications   acetaminophen 650 MG CR tablet Commonly known as:  TYLENOL Take 650-1,300 mg by mouth See admin instructions. Takes 1 tablet at breakfast and 1 at lunch and 2 at bedtime   ANTI-ITCH MEDICATION EX Apply 1 application topically daily as needed (itch).   aspirin EC 81 MG tablet Take 81 mg by mouth every other day.   carisoprodol 350 MG tablet Commonly known as:  SOMA Take 1 tablet (350 mg total) by mouth 3 (three) times daily as needed.   diclofenac 50 MG EC tablet Commonly known as:  VOLTAREN Take 100 mg by mouth daily.   docusate sodium 100 MG capsule Commonly known as:  COLACE Take 100 mg by mouth daily.   estradiol 0.1 MG/GM vaginal cream Commonly known as:  ESTRACE Place 1 Applicatorful vaginally 3 (three) times a week.   Fish Oil 1200 MG Caps Take 1,200 mg by mouth every  evening.   levothyroxine 50 MCG tablet Commonly known as:  SYNTHROID, LEVOTHROID Take 50 mcg by mouth daily before breakfast.   losartan 25 MG tablet Commonly known as:  COZAAR TAKE 1 TABLET DAILY   ondansetron 4 MG tablet Commonly known as:  ZOFRAN Take 1 tablet (4 mg total) by mouth every 8 (eight) hours as needed for nausea or vomiting.   oxyCODONE-acetaminophen 5-325 MG tablet Commonly known as:  PERCOCET Take 1-2 tablets by mouth every 6 (six) hours as needed. What changed:  when to take this   pravastatin 40 MG tablet Commonly known as:  PRAVACHOL Take 40 mg by mouth every evening.   VIACTIV MULTI-VITAMIN Chew Chew 1 tablet by mouth 2 (two) times daily.   VIACTIV PO Take 2 tablets by mouth daily.            Durable Medical Equipment        Start     Ordered   07/19/16 1926  DME Walker rolling  Once    Question:  Patient needs a walker to treat with the following condition  Answer:  S/P lumbar fusion   07/19/16 1925   07/19/16 1926  DME 3 n 1  Once     07/19/16 1925      Disposition: home   Final Dx: PLIF L3-4, L4-5  Discharge Instructions     Remove dressing in 72 hours    Complete by:  As directed    Call MD for:  difficulty breathing, headache or visual disturbances    Complete by:  As directed    Call MD for:  persistant nausea and vomiting    Complete by:  As directed    Call MD for:  redness, tenderness, or signs of infection (pain, swelling, redness, odor or green/yellow discharge around incision site)    Complete by:  As directed    Call MD for:  severe uncontrolled pain    Complete by:  As directed    Call MD for:  temperature >100.4    Complete by:  As directed    Diet - low sodium heart healthy    Complete by:  As directed    Increase activity slowly    Complete by:  As directed          Signed: Kana Reimann S 07/20/2016, 10:13 AM

## 2016-07-20 NOTE — Progress Notes (Signed)
Pt discharging at this time with husband taking all personal belongings. Surgical dressing clean dry and intact. Discharge instructions along with prescriptions provided with verbal understanding. Pt has a follow up appt with MD scheduled. No noted distress.

## 2016-07-24 ENCOUNTER — Encounter (HOSPITAL_COMMUNITY): Payer: Self-pay | Admitting: Neurological Surgery

## 2016-08-28 DIAGNOSIS — M4316 Spondylolisthesis, lumbar region: Secondary | ICD-10-CM | POA: Diagnosis not present

## 2016-08-30 DIAGNOSIS — I10 Essential (primary) hypertension: Secondary | ICD-10-CM | POA: Diagnosis not present

## 2016-08-30 DIAGNOSIS — S80862A Insect bite (nonvenomous), left lower leg, initial encounter: Secondary | ICD-10-CM | POA: Diagnosis not present

## 2016-08-30 DIAGNOSIS — W57XXXA Bitten or stung by nonvenomous insect and other nonvenomous arthropods, initial encounter: Secondary | ICD-10-CM | POA: Diagnosis not present

## 2016-08-30 DIAGNOSIS — L089 Local infection of the skin and subcutaneous tissue, unspecified: Secondary | ICD-10-CM | POA: Diagnosis not present

## 2016-09-10 DIAGNOSIS — M4316 Spondylolisthesis, lumbar region: Secondary | ICD-10-CM | POA: Diagnosis not present

## 2016-09-10 DIAGNOSIS — M4807 Spinal stenosis, lumbosacral region: Secondary | ICD-10-CM | POA: Diagnosis not present

## 2016-09-10 DIAGNOSIS — M545 Low back pain: Secondary | ICD-10-CM | POA: Diagnosis not present

## 2016-09-12 DIAGNOSIS — M4807 Spinal stenosis, lumbosacral region: Secondary | ICD-10-CM | POA: Diagnosis not present

## 2016-09-12 DIAGNOSIS — M545 Low back pain: Secondary | ICD-10-CM | POA: Diagnosis not present

## 2016-09-12 DIAGNOSIS — M4316 Spondylolisthesis, lumbar region: Secondary | ICD-10-CM | POA: Diagnosis not present

## 2016-09-24 DIAGNOSIS — M545 Low back pain: Secondary | ICD-10-CM | POA: Diagnosis not present

## 2016-09-24 DIAGNOSIS — M4316 Spondylolisthesis, lumbar region: Secondary | ICD-10-CM | POA: Diagnosis not present

## 2016-09-24 DIAGNOSIS — M4807 Spinal stenosis, lumbosacral region: Secondary | ICD-10-CM | POA: Diagnosis not present

## 2016-10-02 DIAGNOSIS — M4807 Spinal stenosis, lumbosacral region: Secondary | ICD-10-CM | POA: Diagnosis not present

## 2016-10-02 DIAGNOSIS — M4316 Spondylolisthesis, lumbar region: Secondary | ICD-10-CM | POA: Diagnosis not present

## 2016-10-02 DIAGNOSIS — M545 Low back pain: Secondary | ICD-10-CM | POA: Diagnosis not present

## 2016-10-04 DIAGNOSIS — M4316 Spondylolisthesis, lumbar region: Secondary | ICD-10-CM | POA: Diagnosis not present

## 2016-10-04 DIAGNOSIS — M4807 Spinal stenosis, lumbosacral region: Secondary | ICD-10-CM | POA: Diagnosis not present

## 2016-10-04 DIAGNOSIS — M545 Low back pain: Secondary | ICD-10-CM | POA: Diagnosis not present

## 2016-10-08 DIAGNOSIS — M4807 Spinal stenosis, lumbosacral region: Secondary | ICD-10-CM | POA: Diagnosis not present

## 2016-10-08 DIAGNOSIS — M545 Low back pain: Secondary | ICD-10-CM | POA: Diagnosis not present

## 2016-10-08 DIAGNOSIS — M4316 Spondylolisthesis, lumbar region: Secondary | ICD-10-CM | POA: Diagnosis not present

## 2016-10-11 DIAGNOSIS — M545 Low back pain: Secondary | ICD-10-CM | POA: Diagnosis not present

## 2016-10-11 DIAGNOSIS — M4807 Spinal stenosis, lumbosacral region: Secondary | ICD-10-CM | POA: Diagnosis not present

## 2016-10-11 DIAGNOSIS — M4316 Spondylolisthesis, lumbar region: Secondary | ICD-10-CM | POA: Diagnosis not present

## 2016-10-18 DIAGNOSIS — M4807 Spinal stenosis, lumbosacral region: Secondary | ICD-10-CM | POA: Diagnosis not present

## 2016-10-18 DIAGNOSIS — M4316 Spondylolisthesis, lumbar region: Secondary | ICD-10-CM | POA: Diagnosis not present

## 2016-10-18 DIAGNOSIS — M545 Low back pain: Secondary | ICD-10-CM | POA: Diagnosis not present

## 2016-10-23 DIAGNOSIS — M545 Low back pain: Secondary | ICD-10-CM | POA: Diagnosis not present

## 2016-10-23 DIAGNOSIS — M4316 Spondylolisthesis, lumbar region: Secondary | ICD-10-CM | POA: Diagnosis not present

## 2016-10-23 DIAGNOSIS — M4807 Spinal stenosis, lumbosacral region: Secondary | ICD-10-CM | POA: Diagnosis not present

## 2016-11-05 DIAGNOSIS — I10 Essential (primary) hypertension: Secondary | ICD-10-CM | POA: Diagnosis not present

## 2016-11-05 DIAGNOSIS — E039 Hypothyroidism, unspecified: Secondary | ICD-10-CM | POA: Diagnosis not present

## 2016-11-05 DIAGNOSIS — F329 Major depressive disorder, single episode, unspecified: Secondary | ICD-10-CM | POA: Diagnosis not present

## 2016-11-05 DIAGNOSIS — Z1389 Encounter for screening for other disorder: Secondary | ICD-10-CM | POA: Diagnosis not present

## 2016-11-05 DIAGNOSIS — E559 Vitamin D deficiency, unspecified: Secondary | ICD-10-CM | POA: Diagnosis not present

## 2016-11-05 DIAGNOSIS — L299 Pruritus, unspecified: Secondary | ICD-10-CM | POA: Diagnosis not present

## 2016-11-05 DIAGNOSIS — E785 Hyperlipidemia, unspecified: Secondary | ICD-10-CM | POA: Diagnosis not present

## 2016-11-05 DIAGNOSIS — E669 Obesity, unspecified: Secondary | ICD-10-CM | POA: Diagnosis not present

## 2016-11-05 DIAGNOSIS — R05 Cough: Secondary | ICD-10-CM | POA: Diagnosis not present

## 2016-11-06 DIAGNOSIS — L301 Dyshidrosis [pompholyx]: Secondary | ICD-10-CM | POA: Diagnosis not present

## 2016-11-30 DIAGNOSIS — M19011 Primary osteoarthritis, right shoulder: Secondary | ICD-10-CM | POA: Diagnosis not present

## 2016-11-30 DIAGNOSIS — M25511 Pain in right shoulder: Secondary | ICD-10-CM | POA: Diagnosis not present

## 2016-12-10 DIAGNOSIS — M4316 Spondylolisthesis, lumbar region: Secondary | ICD-10-CM | POA: Diagnosis not present

## 2016-12-10 DIAGNOSIS — Z6830 Body mass index (BMI) 30.0-30.9, adult: Secondary | ICD-10-CM | POA: Diagnosis not present

## 2016-12-10 DIAGNOSIS — I10 Essential (primary) hypertension: Secondary | ICD-10-CM | POA: Diagnosis not present

## 2017-01-15 DIAGNOSIS — Z471 Aftercare following joint replacement surgery: Secondary | ICD-10-CM | POA: Diagnosis not present

## 2017-01-15 DIAGNOSIS — Z96612 Presence of left artificial shoulder joint: Secondary | ICD-10-CM | POA: Diagnosis not present

## 2017-01-30 DIAGNOSIS — W57XXXA Bitten or stung by nonvenomous insect and other nonvenomous arthropods, initial encounter: Secondary | ICD-10-CM | POA: Diagnosis not present

## 2017-01-30 DIAGNOSIS — S60569A Insect bite (nonvenomous) of unspecified hand, initial encounter: Secondary | ICD-10-CM | POA: Diagnosis not present

## 2017-01-30 DIAGNOSIS — Z23 Encounter for immunization: Secondary | ICD-10-CM | POA: Diagnosis not present

## 2017-02-15 DIAGNOSIS — M21612 Bunion of left foot: Secondary | ICD-10-CM | POA: Diagnosis not present

## 2017-02-15 DIAGNOSIS — M79672 Pain in left foot: Secondary | ICD-10-CM | POA: Diagnosis not present

## 2017-04-11 ENCOUNTER — Encounter (HOSPITAL_COMMUNITY): Payer: Self-pay

## 2017-04-11 ENCOUNTER — Other Ambulatory Visit (HOSPITAL_COMMUNITY): Payer: Self-pay | Admitting: *Deleted

## 2017-04-11 DIAGNOSIS — M19011 Primary osteoarthritis, right shoulder: Secondary | ICD-10-CM | POA: Diagnosis not present

## 2017-04-11 NOTE — Pre-Procedure Instructions (Signed)
ABRIAL ARRIGHI  04/11/2017    Your procedure is scheduled on  Thursday, April 18, 2017 at 10:15 AM.   Report to [facility] Entrance "A" Admitting Office at 8:15 AM.   Call this number if you have problems the morning of surgery: [phone]   Questions prior to day of surgery, please call [phone] between 8 & 4 PM.   Remember:  Do not eat food or drink liquids after midnight Wednesday, 04/17/17.  Take these medicines the morning of surgery with A SIP OF WATER: Levothyroxine (Synthroid), Tylenol - if needed.  Stop Aspirin, Fish Oil and NSAIDS (Diclofenac, Aleve, Ibuprofen, etc) as of today.   Do not wear jewelry, make-up or nail polish.  Do not wear lotions, powders, perfumes or deodorant.  Do not shave 48 hours prior to surgery.    Do not bring valuables to the hospital.  [facility] is not responsible for any belongings or valuables.  Contacts, dentures or bridgework may not be worn into surgery.  Leave your suitcase in the car.  After surgery it may be brought to your room.  For patients admitted to the hospital, discharge time will be determined by your treatment team.  [facility] - Preparing for Surgery  Before surgery, you can play an important role.  Because skin is not sterile, your skin needs to be as free of germs as possible.  You can reduce the number of germs on you skin by washing with CHG (chlorahexidine gluconate) soap before surgery.  CHG is an antiseptic cleaner which kills germs and bonds with the skin to continue killing germs even after washing.  Please DO NOT use if you have an allergy to CHG or antibacterial soaps.  If your skin becomes reddened/irritated stop using the CHG and inform your nurse when you arrive at Short Stay.  Do not shave (including legs and underarms) for at least 48 hours prior to the first CHG shower.  You may shave your face.  Please follow these instructions carefully:   1.  Shower with CHG Soap the night before  surgery and the                    morning of Surgery.  2.  If you choose to wash your hair, wash your hair first as usual with your       normal shampoo.  3.  After you shampoo, rinse your hair and body thoroughly to remove the shampoo.  4.  Use CHG as you would any other liquid soap.  You can apply chg directly       to the skin and wash gently with scrungie or a clean washcloth.  5.  Apply the CHG Soap to your body ONLY FROM THE NECK DOWN.        Do not use on open wounds or open sores.  Avoid contact with your eyes, ears, mouth and genitals (private parts).  Wash genitals (private parts) with your normal soap.  6.  Wash thoroughly, paying special attention to the area where your surgery        will be performed.  7.  Thoroughly rinse your body with warm water from the neck down.  8.  DO NOT shower/wash with your normal soap after using and rinsing off       the CHG Soap.  9.  Pat yourself dry with a clean towel.            10.  Wear  clean pajamas.            11.  Place clean sheets on your bed the night of your first shower and do not        sleep with pets.  Day of Surgery  Do not apply any lotions/deodorants the morning of surgery.  Please wear clean clothes to the hospital.   Please read over the fact sheets that you were given.

## 2017-04-12 ENCOUNTER — Encounter (HOSPITAL_COMMUNITY)
Admission: RE | Admit: 2017-04-12 | Discharge: 2017-04-12 | Disposition: A | Discharge status: Home / Self Care | Payer: Medicare Other | Source: Ambulatory Visit | Attending: Orthopedic Surgery | Admitting: Orthopedic Surgery

## 2017-04-12 ENCOUNTER — Other Ambulatory Visit: Payer: Self-pay

## 2017-04-12 ENCOUNTER — Encounter (HOSPITAL_COMMUNITY): Payer: Self-pay

## 2017-04-12 DIAGNOSIS — Z01810 Encounter for preprocedural cardiovascular examination: Secondary | ICD-10-CM | POA: Diagnosis not present

## 2017-04-12 DIAGNOSIS — Z01812 Encounter for preprocedural laboratory examination: Secondary | ICD-10-CM | POA: Insufficient documentation

## 2017-04-12 DIAGNOSIS — I10 Essential (primary) hypertension: Secondary | ICD-10-CM | POA: Insufficient documentation

## 2017-04-12 HISTORY — DX: Other specified postprocedural states: R11.2

## 2017-04-12 HISTORY — DX: Other specified postprocedural states: Z98.890

## 2017-04-12 LAB — BASIC METABOLIC PANEL
Anion gap: 7 (ref 5–15)
BUN: 16 mg/dL (ref 6–20)
CO2: 24 mmol/L (ref 22–32)
CREATININE: 0.72 mg/dL (ref 0.44–1.00)
Calcium: 9.4 mg/dL (ref 8.9–10.3)
Chloride: 106 mmol/L (ref 101–111)
GFR calc Af Amer: >60 mL/min (ref >60)
Glucose, Bld: 91 mg/dL (ref 65–99)
Potassium: 4.3 mmol/L (ref 3.5–5.1)
SODIUM: 137 mmol/L (ref 135–145)

## 2017-04-12 LAB — CBC
HCT: 43.7 % (ref 36.0–46.0)
Hemoglobin: 14.5 g/dL (ref 12.0–15.0)
MCH: 31.7 pg (ref 26.0–34.0)
MCHC: 33.2 g/dL (ref 30.0–36.0)
MCV: 95.4 fL (ref 78.0–100.0)
PLATELETS: 294 10*3/uL (ref 150–400)
RBC: 4.58 MIL/uL (ref 3.87–5.11)
RDW: 13.3 % (ref 11.5–15.5)
WBC: 9.2 10*3/uL (ref 4.0–10.5)

## 2017-04-12 LAB — SURGICAL PCR SCREEN
MRSA, PCR: NEGATIVE
STAPHYLOCOCCUS AUREUS: NEGATIVE

## 2017-04-17 MED ORDER — CEFAZOLIN SODIUM-DEXTROSE 2-4 GM/100ML-% IV SOLN
2.0000 g | INTRAVENOUS | Status: AC
  Administered 2017-04-18: 2 g via INTRAVENOUS
  Filled 2017-04-17: qty 100

## 2017-04-17 MED ORDER — TRANEXAMIC ACID 1000 MG/10ML IV SOLN
1000.0000 mg | INTRAVENOUS | Status: AC
  Administered 2017-04-18: 1000 mg via INTRAVENOUS
  Filled 2017-04-17: qty 1100

## 2017-04-17 NOTE — Progress Notes (Signed)
Pt called to say she has a bite to her non-operative shoulder. Pt reports a small ring around this bite and is concerned for Lyme disease. Pt reports no other symptoms and she does not know what bit her. Pt states she will call Dr. Susie Cassette office immediately and notify PCP if necessary. Surgery is scheduled for tomorrow.

## 2017-04-18 ENCOUNTER — Encounter (HOSPITAL_COMMUNITY)
Admission: RE | Disposition: A | Discharge status: Home / Self Care | Payer: Self-pay | Source: Home / Self Care | Attending: Orthopedic Surgery

## 2017-04-18 ENCOUNTER — Other Ambulatory Visit: Payer: Self-pay

## 2017-04-18 ENCOUNTER — Inpatient Hospital Stay (HOSPITAL_COMMUNITY)
Admission: RE | Admit: 2017-04-18 | Discharge: 2017-04-19 | DRG: 483 | Disposition: A | Discharge status: Home / Self Care | Payer: Medicare Other | Attending: Orthopedic Surgery | Admitting: Orthopedic Surgery

## 2017-04-18 ENCOUNTER — Inpatient Hospital Stay (HOSPITAL_COMMUNITY): Payer: Medicare Other | Admitting: Certified Registered Nurse Anesthetist

## 2017-04-18 ENCOUNTER — Encounter (HOSPITAL_COMMUNITY): Payer: Self-pay | Admitting: Certified Registered Nurse Anesthetist

## 2017-04-18 DIAGNOSIS — Z79899 Other long term (current) drug therapy: Secondary | ICD-10-CM | POA: Diagnosis not present

## 2017-04-18 DIAGNOSIS — Z79890 Hormone replacement therapy (postmenopausal): Secondary | ICD-10-CM | POA: Diagnosis not present

## 2017-04-18 DIAGNOSIS — Z87442 Personal history of urinary calculi: Secondary | ICD-10-CM

## 2017-04-18 DIAGNOSIS — Z7982 Long term (current) use of aspirin: Secondary | ICD-10-CM

## 2017-04-18 DIAGNOSIS — M25511 Pain in right shoulder: Secondary | ICD-10-CM | POA: Diagnosis not present

## 2017-04-18 DIAGNOSIS — Z86010 Personal history of colonic polyps: Secondary | ICD-10-CM

## 2017-04-18 DIAGNOSIS — Z96612 Presence of left artificial shoulder joint: Secondary | ICD-10-CM | POA: Diagnosis present

## 2017-04-18 DIAGNOSIS — M19011 Primary osteoarthritis, right shoulder: Principal | ICD-10-CM | POA: Diagnosis present

## 2017-04-18 DIAGNOSIS — Z90710 Acquired absence of both cervix and uterus: Secondary | ICD-10-CM

## 2017-04-18 DIAGNOSIS — G8918 Other acute postprocedural pain: Secondary | ICD-10-CM | POA: Diagnosis not present

## 2017-04-18 DIAGNOSIS — H269 Unspecified cataract: Secondary | ICD-10-CM | POA: Diagnosis present

## 2017-04-18 DIAGNOSIS — I10 Essential (primary) hypertension: Secondary | ICD-10-CM | POA: Diagnosis present

## 2017-04-18 DIAGNOSIS — E039 Hypothyroidism, unspecified: Secondary | ICD-10-CM | POA: Diagnosis present

## 2017-04-18 DIAGNOSIS — K5900 Constipation, unspecified: Secondary | ICD-10-CM | POA: Diagnosis present

## 2017-04-18 DIAGNOSIS — M25711 Osteophyte, right shoulder: Secondary | ICD-10-CM | POA: Diagnosis present

## 2017-04-18 DIAGNOSIS — Z96619 Presence of unspecified artificial shoulder joint: Secondary | ICD-10-CM

## 2017-04-18 DIAGNOSIS — F329 Major depressive disorder, single episode, unspecified: Secondary | ICD-10-CM | POA: Diagnosis present

## 2017-04-18 DIAGNOSIS — E785 Hyperlipidemia, unspecified: Secondary | ICD-10-CM | POA: Diagnosis present

## 2017-04-18 DIAGNOSIS — Z96611 Presence of right artificial shoulder joint: Secondary | ICD-10-CM

## 2017-04-18 DIAGNOSIS — Z981 Arthrodesis status: Secondary | ICD-10-CM | POA: Diagnosis not present

## 2017-04-18 DIAGNOSIS — G8929 Other chronic pain: Secondary | ICD-10-CM | POA: Diagnosis present

## 2017-04-18 HISTORY — PX: TOTAL SHOULDER ARTHROPLASTY: SHX126

## 2017-04-18 HISTORY — DX: Primary osteoarthritis, unspecified shoulder: M19.019

## 2017-04-18 SURGERY — ARTHROPLASTY, SHOULDER, TOTAL
Anesthesia: General | Site: Shoulder | Laterality: Right

## 2017-04-18 MED ORDER — PHENYLEPHRINE 40 MCG/ML (10ML) SYRINGE FOR IV PUSH (FOR BLOOD PRESSURE SUPPORT)
PREFILLED_SYRINGE | INTRAVENOUS | Status: DC | PRN
  Administered 2017-04-18: 80 ug via INTRAVENOUS

## 2017-04-18 MED ORDER — PHENYLEPHRINE HCL 10 MG/ML IJ SOLN
INTRAVENOUS | Status: DC | PRN
  Administered 2017-04-18: 25 ug/min via INTRAVENOUS

## 2017-04-18 MED ORDER — CEFAZOLIN SODIUM-DEXTROSE 2-4 GM/100ML-% IV SOLN
2.0000 g | Freq: Four times a day (QID) | INTRAVENOUS | Status: AC
  Administered 2017-04-18 – 2017-04-19 (×3): 2 g via INTRAVENOUS
  Filled 2017-04-18 (×3): qty 100

## 2017-04-18 MED ORDER — FLEET ENEMA 7-19 GM/118ML RE ENEM: 1.0000 | ENEMA | Freq: Once | RECTAL | Status: DC | PRN

## 2017-04-18 MED ORDER — ONDANSETRON HCL 4 MG PO TABS: 4.0000 mg | ORAL_TABLET | Freq: Four times a day (QID) | ORAL | Status: DC | PRN

## 2017-04-18 MED ORDER — SUGAMMADEX SODIUM 200 MG/2ML IV SOLN
INTRAVENOUS | Status: AC
  Filled 2017-04-18: qty 2

## 2017-04-18 MED ORDER — DIPHENHYDRAMINE HCL 12.5 MG/5ML PO ELIX: 12.5000 mg | ORAL_SOLUTION | ORAL | Status: DC | PRN

## 2017-04-18 MED ORDER — MENTHOL 3 MG MT LOZG: 1.0000 | LOZENGE | OROMUCOSAL | Status: DC | PRN

## 2017-04-18 MED ORDER — LIDOCAINE HCL (CARDIAC) 20 MG/ML IV SOLN
INTRAVENOUS | Status: DC | PRN
  Administered 2017-04-18: 50 mg via INTRAVENOUS

## 2017-04-18 MED ORDER — ROCURONIUM BROMIDE 10 MG/ML (PF) SYRINGE
PREFILLED_SYRINGE | INTRAVENOUS | Status: AC
  Filled 2017-04-18: qty 5

## 2017-04-18 MED ORDER — DEXAMETHASONE SODIUM PHOSPHATE 10 MG/ML IJ SOLN
INTRAMUSCULAR | Status: AC
  Filled 2017-04-18: qty 1

## 2017-04-18 MED ORDER — PHENYLEPHRINE 40 MCG/ML (10ML) SYRINGE FOR IV PUSH (FOR BLOOD PRESSURE SUPPORT)
PREFILLED_SYRINGE | INTRAVENOUS | Status: AC
  Filled 2017-04-18: qty 10

## 2017-04-18 MED ORDER — FAMOTIDINE IN NACL 20-0.9 MG/50ML-% IV SOLN
20.0000 mg | Freq: Once | INTRAVENOUS | Status: AC
  Administered 2017-04-18: 20 mg via INTRAVENOUS
  Filled 2017-04-18: qty 50

## 2017-04-18 MED ORDER — ACETAMINOPHEN 650 MG RE SUPP: 650.0000 mg | RECTAL | Status: DC | PRN

## 2017-04-18 MED ORDER — BISACODYL 5 MG PO TBEC: 5.0000 mg | DELAYED_RELEASE_TABLET | Freq: Every day | ORAL | Status: DC | PRN

## 2017-04-18 MED ORDER — LACTATED RINGERS IV SOLN
INTRAVENOUS | Status: DC
  Administered 2017-04-18: 15:00:00 via INTRAVENOUS

## 2017-04-18 MED ORDER — DIAZEPAM 5 MG PO TABS
2.5000 mg | ORAL_TABLET | Freq: Four times a day (QID) | ORAL | Status: DC | PRN
  Administered 2017-04-19: 5 mg via ORAL
  Filled 2017-04-18: qty 1

## 2017-04-18 MED ORDER — OXYCODONE HCL 5 MG PO TABS
5.0000 mg | ORAL_TABLET | ORAL | Status: DC | PRN
  Administered 2017-04-18 – 2017-04-19 (×2): 5 mg via ORAL
  Filled 2017-04-18 (×2): qty 1

## 2017-04-18 MED ORDER — HYDROMORPHONE HCL 1 MG/ML IJ SOLN
1.0000 mg | INTRAMUSCULAR | Status: DC | PRN
  Administered 2017-04-19: 1 mg via INTRAVENOUS
  Filled 2017-04-18: qty 1

## 2017-04-18 MED ORDER — ONDANSETRON HCL 4 MG/2ML IJ SOLN: 4.0000 mg | Freq: Four times a day (QID) | INTRAMUSCULAR | Status: DC | PRN

## 2017-04-18 MED ORDER — ACETAMINOPHEN 325 MG PO TABS: 650.0000 mg | ORAL_TABLET | ORAL | Status: DC | PRN

## 2017-04-18 MED ORDER — POLYETHYLENE GLYCOL 3350 17 G PO PACK: 17.0000 g | PACK | Freq: Every day | ORAL | Status: DC | PRN

## 2017-04-18 MED ORDER — ONDANSETRON HCL 4 MG/2ML IJ SOLN: 4.0000 mg | Freq: Once | INTRAMUSCULAR | Status: DC | PRN

## 2017-04-18 MED ORDER — CHLORHEXIDINE GLUCONATE 4 % EX LIQD: 60.0000 mL | Freq: Once | CUTANEOUS | Status: DC

## 2017-04-18 MED ORDER — ONDANSETRON HCL 4 MG/2ML IJ SOLN
INTRAMUSCULAR | Status: AC
  Filled 2017-04-18: qty 2

## 2017-04-18 MED ORDER — METOCLOPRAMIDE HCL 5 MG PO TABS: 5.0000 mg | ORAL_TABLET | Freq: Three times a day (TID) | ORAL | Status: DC | PRN

## 2017-04-18 MED ORDER — FENTANYL CITRATE (PF) 100 MCG/2ML IJ SOLN
INTRAMUSCULAR | Status: DC | PRN
  Administered 2017-04-18 (×2): 50 ug via INTRAVENOUS

## 2017-04-18 MED ORDER — MIDAZOLAM HCL 2 MG/2ML IJ SOLN
INTRAMUSCULAR | Status: AC
  Administered 2017-04-18: 2 mg via INTRAVENOUS
  Filled 2017-04-18: qty 2

## 2017-04-18 MED ORDER — METOCLOPRAMIDE HCL 5 MG/ML IJ SOLN: 5.0000 mg | Freq: Three times a day (TID) | INTRAMUSCULAR | Status: DC | PRN

## 2017-04-18 MED ORDER — SCOPOLAMINE 1 MG/3DAYS TD PT72
1.0000 | MEDICATED_PATCH | TRANSDERMAL | Status: DC
  Administered 2017-04-18: 1.5 mg via TRANSDERMAL
  Filled 2017-04-18: qty 1

## 2017-04-18 MED ORDER — LACTATED RINGERS IV SOLN
INTRAVENOUS | Status: DC
  Administered 2017-04-18 (×2): via INTRAVENOUS

## 2017-04-18 MED ORDER — ROPIVACAINE HCL 7.5 MG/ML IJ SOLN
INTRAMUSCULAR | Status: DC | PRN
  Administered 2017-04-18: 20 mL via PERINEURAL

## 2017-04-18 MED ORDER — DOCUSATE SODIUM 100 MG PO CAPS
100.0000 mg | ORAL_CAPSULE | Freq: Two times a day (BID) | ORAL | Status: DC
  Administered 2017-04-18 – 2017-04-19 (×2): 100 mg via ORAL
  Filled 2017-04-18 (×2): qty 1

## 2017-04-18 MED ORDER — PHENOL 1.4 % MT LIQD: 1.0000 | OROMUCOSAL | Status: DC | PRN

## 2017-04-18 MED ORDER — FENTANYL CITRATE (PF) 100 MCG/2ML IJ SOLN
INTRAMUSCULAR | Status: AC
  Filled 2017-04-18: qty 2

## 2017-04-18 MED ORDER — PROPOFOL 10 MG/ML IV BOLUS
INTRAVENOUS | Status: AC
  Filled 2017-04-18: qty 20

## 2017-04-18 MED ORDER — OXYCODONE HCL 5 MG PO TABS
10.0000 mg | ORAL_TABLET | ORAL | Status: DC | PRN
  Administered 2017-04-18 – 2017-04-19 (×2): 10 mg via ORAL
  Filled 2017-04-18: qty 2

## 2017-04-18 MED ORDER — GLYCOPYRROLATE 0.2 MG/ML IJ SOLN
INTRAMUSCULAR | Status: DC | PRN
  Administered 2017-04-18: 0.2 mg via INTRAVENOUS

## 2017-04-18 MED ORDER — FENTANYL CITRATE (PF) 100 MCG/2ML IJ SOLN
25.0000 ug | INTRAMUSCULAR | Status: DC | PRN
  Administered 2017-04-18: 50 ug via INTRAVENOUS

## 2017-04-18 MED ORDER — KETOROLAC TROMETHAMINE 15 MG/ML IJ SOLN
INTRAMUSCULAR | Status: AC
  Filled 2017-04-18: qty 1

## 2017-04-18 MED ORDER — ALUM & MAG HYDROXIDE-SIMETH 200-200-20 MG/5ML PO SUSP: 30.0000 mL | ORAL | Status: DC | PRN

## 2017-04-18 MED ORDER — LIDOCAINE 2% (20 MG/ML) 5 ML SYRINGE
INTRAMUSCULAR | Status: AC
  Filled 2017-04-18: qty 5

## 2017-04-18 MED ORDER — OXYCODONE HCL 5 MG PO TABS
ORAL_TABLET | ORAL | Status: AC
  Filled 2017-04-18: qty 2

## 2017-04-18 MED ORDER — PROPOFOL 10 MG/ML IV BOLUS
INTRAVENOUS | Status: DC | PRN
  Administered 2017-04-18: 150 mg via INTRAVENOUS

## 2017-04-18 MED ORDER — FENTANYL CITRATE (PF) 100 MCG/2ML IJ SOLN
INTRAMUSCULAR | Status: AC
  Administered 2017-04-18: 50 ug via INTRAVENOUS
  Filled 2017-04-18: qty 2

## 2017-04-18 MED ORDER — ONDANSETRON HCL 4 MG/2ML IJ SOLN
INTRAMUSCULAR | Status: DC | PRN
  Administered 2017-04-18: 4 mg via INTRAVENOUS

## 2017-04-18 MED ORDER — MIDAZOLAM HCL 2 MG/2ML IJ SOLN
2.0000 mg | Freq: Once | INTRAMUSCULAR | Status: AC
  Administered 2017-04-18: 2 mg via INTRAVENOUS

## 2017-04-18 MED ORDER — LEVOTHYROXINE SODIUM 50 MCG PO TABS
50.0000 ug | ORAL_TABLET | Freq: Every day | ORAL | Status: DC
  Administered 2017-04-19: 50 ug via ORAL
  Filled 2017-04-18: qty 1

## 2017-04-18 MED ORDER — KETOROLAC TROMETHAMINE 15 MG/ML IJ SOLN
7.5000 mg | Freq: Four times a day (QID) | INTRAMUSCULAR | Status: AC
  Administered 2017-04-18 – 2017-04-19 (×4): 7.5 mg via INTRAVENOUS
  Filled 2017-04-18 (×3): qty 1

## 2017-04-18 MED ORDER — LOSARTAN POTASSIUM 25 MG PO TABS
25.0000 mg | ORAL_TABLET | Freq: Every day | ORAL | Status: DC
  Administered 2017-04-18 – 2017-04-19 (×2): 25 mg via ORAL
  Filled 2017-04-18 (×2): qty 1

## 2017-04-18 MED ORDER — 0.9 % SODIUM CHLORIDE (POUR BTL) OPTIME
TOPICAL | Status: DC | PRN
  Administered 2017-04-18: 1000 mL

## 2017-04-18 MED ORDER — DEXAMETHASONE SODIUM PHOSPHATE 10 MG/ML IJ SOLN
INTRAMUSCULAR | Status: DC | PRN
  Administered 2017-04-18: 10 mg via INTRAVENOUS

## 2017-04-18 MED ORDER — FENTANYL CITRATE (PF) 250 MCG/5ML IJ SOLN
INTRAMUSCULAR | Status: AC
  Filled 2017-04-18: qty 5

## 2017-04-18 MED ORDER — ROCURONIUM BROMIDE 100 MG/10ML IV SOLN
INTRAVENOUS | Status: DC | PRN
  Administered 2017-04-18: 50 mg via INTRAVENOUS

## 2017-04-18 MED ORDER — SUGAMMADEX SODIUM 200 MG/2ML IV SOLN
INTRAVENOUS | Status: DC | PRN
  Administered 2017-04-18: 200 mg via INTRAVENOUS

## 2017-04-18 SURGICAL SUPPLY — 57 items
BLADE SAW SGTL 83.5X18.5 (BLADE) ×3 IMPLANT
CEMENT BONE DEPUY (Cement) ×3 IMPLANT
COVER SURGICAL LIGHT HANDLE (MISCELLANEOUS) ×3 IMPLANT
DERMABOND ADHESIVE PROPEN (GAUZE/BANDAGES/DRESSINGS) ×2
DERMABOND ADVANCED .7 DNX6 (GAUZE/BANDAGES/DRESSINGS) ×1 IMPLANT
DRAPE ORTHO SPLIT 77X108 STRL (DRAPES) ×4
DRAPE SURG 17X11 SM STRL (DRAPES) ×3 IMPLANT
DRAPE SURG ORHT 6 SPLT 77X108 (DRAPES) ×2 IMPLANT
DRAPE U-SHAPE 47X51 STRL (DRAPES) ×3 IMPLANT
DRSG AQUACEL AG ADV 3.5X10 (GAUZE/BANDAGES/DRESSINGS) ×3 IMPLANT
DURAPREP 26ML APPLICATOR (WOUND CARE) ×3 IMPLANT
ELECT BLADE 4.0 EZ CLEAN MEGAD (MISCELLANEOUS) ×3
ELECT CAUTERY BLADE 6.4 (BLADE) ×3 IMPLANT
ELECT REM PT RETURN 9FT ADLT (ELECTROSURGICAL) ×3
ELECTRODE BLDE 4.0 EZ CLN MEGD (MISCELLANEOUS) ×1 IMPLANT
ELECTRODE REM PT RTRN 9FT ADLT (ELECTROSURGICAL) ×1 IMPLANT
FACESHIELD WRAPAROUND (MASK) ×6 IMPLANT
GLENOID WITH CLEAT MEDIUM (Shoulder) ×3 IMPLANT
GLOVE BIO SURGEON STRL SZ7.5 (GLOVE) ×3 IMPLANT
GLOVE BIO SURGEON STRL SZ8 (GLOVE) ×3 IMPLANT
GLOVE EUDERMIC 7 POWDERFREE (GLOVE) ×3 IMPLANT
GLOVE SS BIOGEL STRL SZ 7.5 (GLOVE) ×1 IMPLANT
GLOVE SUPERSENSE BIOGEL SZ 7.5 (GLOVE) ×2
GOWN STRL REUS W/ TWL LRG LVL3 (GOWN DISPOSABLE) ×3 IMPLANT
GOWN STRL REUS W/ TWL XL LVL3 (GOWN DISPOSABLE) ×1 IMPLANT
GOWN STRL REUS W/TWL LRG LVL3 (GOWN DISPOSABLE) ×6
GOWN STRL REUS W/TWL XL LVL3 (GOWN DISPOSABLE) ×2
HEAD HUMERAL UNIVERSE 42X17 (Head) ×3 IMPLANT
KIT BASIN OR (CUSTOM PROCEDURE TRAY) ×3 IMPLANT
KIT ROOM TURNOVER OR (KITS) ×3 IMPLANT
KIT SET UNIVERSAL (KITS) ×3 IMPLANT
MANIFOLD NEPTUNE II (INSTRUMENTS) ×3 IMPLANT
NDL SUT .5 MAYO 1.404X.05X (NEEDLE) ×1 IMPLANT
NDL SUT 6 .5 CRC .975X.05 MAYO (NEEDLE) ×1 IMPLANT
NEEDLE MAYO TAPER (NEEDLE) ×4
NS IRRIG 1000ML POUR BTL (IV SOLUTION) ×3 IMPLANT
PACK SHOULDER (CUSTOM PROCEDURE TRAY) ×3 IMPLANT
PAD ARMBOARD 7.5X6 YLW CONV (MISCELLANEOUS) ×6 IMPLANT
RESTRAINT HEAD UNIVERSAL NS (MISCELLANEOUS) ×3 IMPLANT
SLING ARM FOAM STRAP LRG (SOFTGOODS) ×3 IMPLANT
SLING ARM XL FOAM STRAP (SOFTGOODS) IMPLANT
SMARTMIX MINI TOWER (MISCELLANEOUS) ×3
SPONGE LAP 18X18 X RAY DECT (DISPOSABLE) ×3 IMPLANT
SPONGE LAP 4X18 X RAY DECT (DISPOSABLE) ×3 IMPLANT
STEM APEX UNIVERSAL 6MM SHOULD (Stem) ×3 IMPLANT
SUCTION FRAZIER HANDLE 10FR (MISCELLANEOUS) ×2
SUCTION TUBE FRAZIER 10FR DISP (MISCELLANEOUS) ×1 IMPLANT
SUT FIBERWIRE #2 38 T-5 BLUE (SUTURE) ×15
SUT MNCRL AB 3-0 PS2 18 (SUTURE) ×3 IMPLANT
SUT MON AB 2-0 CT1 36 (SUTURE) ×3 IMPLANT
SUT VIC AB 1 CT1 27 (SUTURE) ×3
SUT VIC AB 1 CT1 27XBRD ANBCTR (SUTURE) ×1 IMPLANT
SUTURE FIBERWR #2 38 T-5 BLUE (SUTURE) ×5 IMPLANT
SYR CONTROL 10ML LL (SYRINGE) IMPLANT
TOWEL OR 17X24 6PK STRL BLUE (TOWEL DISPOSABLE) ×3 IMPLANT
TOWEL OR 17X26 10 PK STRL BLUE (TOWEL DISPOSABLE) ×3 IMPLANT
TOWER SMARTMIX MINI (MISCELLANEOUS) ×1 IMPLANT

## 2017-04-18 NOTE — H&P (Signed)
[name]    Chief Complaint: Right shoulder osteoarthritis HPI: The patient is a 71 y.o. female with end stage right shoulder OA  Past Medical History:  Diagnosis Date  . Arthritis   . Cataracts, bilateral    immature  . Chronic back pain    spondylolisthesis  . Complication of anesthesia    unable to get up due dizziness,nausea- stayed overnite  . Constipation    takes Colace daily  . Depression    but not on any meds  . History of colon polyps    benign one time and precancerous another  . History of kidney stones 12/2015  . Hyperlipidemia    takes Pravastatin daily  . Hypertension    takes Losartan daily  . Hypothyroidism    takes Synthroid daily  . Numbness    tingling/right lower leg   . PONV (postoperative nausea and vomiting)   . Shoulder arthritis    Right    Past Surgical History:  Procedure Laterality Date  . ABDOMINAL HYSTERECTOMY    . BACK SURGERY    . COLONOSCOPY    . MAXIMUM ACCESS (MAS)POSTERIOR LUMBAR INTERBODY FUSION (PLIF) 1 LEVEL N/A 07/19/2016   Procedure: LUMBAR FOUR-FIVE MAXIMUM ACCESS (MAS) POSTERIOR LUMBAR INTERBODY FUSION (PLIF), INSTRUMENTED LUMBAR THREE-FIVE;  Surgeon: Eustace Moore, MD;  Location: [place];  Service: Neurosurgery;  Laterality: N/A;  . SHOULDER SURGERY Left 2004   arthroscopy  . TOTAL SHOULDER ARTHROPLASTY Left 01/19/2016   Procedure: TOTAL SHOULDER ARTHROPLASTY;  Surgeon: Justice Britain, MD;  Location: [place];  Service: Orthopedics;  Laterality: Left;    Family History  Problem Relation Age of Onset  . Lung cancer Mother   . Lung cancer Father     Social History:  reports that  has never smoked. she has never used smokeless tobacco. She reports that she drinks alcohol. She reports that she does not use drugs.   Medications Prior to Admission  Medication Sig Dispense Refill  . acetaminophen (TYLENOL) 650 MG CR tablet Take 650-1,300 mg by mouth See admin instructions. Takes 1 tablet at breakfast and 1 at lunch and 2 at  bedtime    . aspirin EC 81 MG tablet Take 81 mg by mouth every other day.    . Calcium-Vitamin D-Vitamin K (VIACTIV PO) Take 1 tablet by mouth daily.     . diclofenac sodium (VOLTAREN) 1 % GEL Apply 1 application topically 3 (three) times daily as needed (joint pain).    Marland Kitchen estradiol (ESTRACE) 0.1 MG/GM vaginal cream Place 1 Applicatorful vaginally 3 (three) times a week.    . L-LYSINE PO Take 1 tablet by mouth daily.    Marland Kitchen levothyroxine (SYNTHROID, LEVOTHROID) 50 MCG tablet Take 50 mcg by mouth daily before breakfast.     . losartan (COZAAR) 25 MG tablet TAKE 1 TABLET DAILY  0  . Omega-3 Fatty Acids (FISH OIL) 1200 MG CAPS Take 1,200 mg by mouth every evening.    . polycarbophil (FIBERCON) 625 MG tablet Take 625 mg by mouth daily.    . pravastatin (PRAVACHOL) 40 MG tablet Take 40 mg by mouth every evening.     . triamcinolone ointment (KENALOG) 0.1 % Apply 1 application topically 3 (three) times daily as needed (foot itch).       Physical Exam: right shoulder with painful and restricted motion as noted at recent office visits  Vitals  Temp:  [97.7 F (36.5 C)] 97.7 F (36.5 C) (12/27 0822) Pulse Rate:  [61-67] 67 (  12/27 0940) Resp:  [8-18] 16 (12/27 0940) BP: (120-151)/(64-82) 120/64 (12/27 0940) SpO2:  [95 %-99 %] 95 % (12/27 0940) Weight:  [81.6 kg (180 lb)] 81.6 kg (180 lb) (12/27 0846)  Assessment/Plan  Impression: Right shoulder osteoarthritis  Plan of Action: Procedure(s): RIGHT TOTAL SHOULDER ARTHROPLASTY  [name] 04/18/2017, 10:06 AM Contact # [phone]

## 2017-04-18 NOTE — Anesthesia Preprocedure Evaluation (Addendum)
Anesthesia Evaluation  Patient identified by MRN, date of birth, ID band Patient awake    Reviewed: Allergy & Precautions, NPO status , Patient's Chart, lab work & pertinent test results  History of Anesthesia Complications (+) PONV and history of anesthetic complications  Airway Mallampati: II       Dental   Pulmonary neg pulmonary ROS,    breath sounds clear to auscultation       Cardiovascular hypertension, Pt. on medications  Rhythm:Regular Rate:Normal  ECG: NSR, rate 60   Neuro/Psych PSYCHIATRIC DISORDERS Depression negative neurological ROS     GI/Hepatic negative GI ROS, Neg liver ROS,   Endo/Other  Hypothyroidism   Renal/GU Renal disease     Musculoskeletal  (+) Arthritis , Chronic back pain   Abdominal (+) + obese,   Peds  Hematology Hyperlipidemia   Anesthesia Other Findings Right shoulder osteoarthritis  Reproductive/Obstetrics                            Anesthesia Physical  Anesthesia Plan  ASA: III  Anesthesia Plan: General   Post-op Pain Management: GA combined w/ Regional for post-op pain   Induction: Intravenous  PONV Risk Score and Plan: 4 or greater and Midazolam, Ondansetron and Scopolamine patch - Pre-op  Airway Management Planned: Oral ETT  Additional Equipment:   Intra-op Plan:   Post-operative Plan: Extubation in OR  Informed Consent: I have reviewed the patients History and Physical, chart, labs and discussed the procedure including the risks, benefits and alternatives for the proposed anesthesia with the patient or authorized representative who has indicated his/her understanding and acceptance.   Dental advisory given  Plan Discussed with: CRNA  Anesthesia Plan Comments:        Anesthesia Quick Evaluation

## 2017-04-18 NOTE — Op Note (Signed)
04/18/2017  12:25 PM  PATIENT:   [name]  71 y.o. female  PRE-OPERATIVE DIAGNOSIS:  Right shoulder osteoarthritis  POST-OPERATIVE DIAGNOSIS:  same  PROCEDURE:  R TSA #6 stem, 42x17 eccentric head, medium glenoid  SURGEON:  Kowen Kluth, Metta Clines M.D.  ASSISTANTS: Shuford pac   ANESTHESIA:   GET + ISB  EBL: 150  SPECIMEN:  none  Drains: none   PATIENT DISPOSITION:  PACU - hemodynamically stable.    PLAN OF CARE: Admit for overnight observation  Dictation# [id number]   Contact # [phone]

## 2017-04-18 NOTE — Anesthesia Procedure Notes (Signed)
Procedure Name: Intubation Date/Time: 04/18/2017 10:46 AM Performed by: Candis Shine, CRNA Pre-anesthesia Checklist: Patient identified, Emergency Drugs available, Suction available and Patient being monitored Patient Re-evaluated:Patient Re-evaluated prior to induction Oxygen Delivery Method: Circle System Utilized Preoxygenation: Pre-oxygenation with 100% oxygen Induction Type: IV induction Ventilation: Mask ventilation without difficulty Laryngoscope Size: Mac and 3 Grade View: Grade I Tube type: Oral Tube size: 7.0 mm Number of attempts: 1 Airway Equipment and Method: Stylet Placement Confirmation: ETT inserted through vocal cords under direct vision,  positive ETCO2 and breath sounds checked- equal and bilateral Secured at: 22 cm Tube secured with: Tape Dental Injury: Teeth and Oropharynx as per pre-operative assessment

## 2017-04-18 NOTE — Anesthesia Procedure Notes (Signed)
Anesthesia Regional Block: Interscalene brachial plexus block   Pre-Anesthetic Checklist: ,, timeout performed, Correct Patient, Correct Site, Correct Laterality, Correct Procedure,, site marked, risks and benefits discussed, Surgical consent,  Pre-op evaluation,  At surgeon's request and post-op pain management  Laterality: Right  Prep: chloraprep       Needles:  Injection technique: Single-shot  Needle Type: Echogenic Stimulator Needle     Needle Length: 9cm  Needle Gauge: 21     Additional Needles:   Procedures:,,,, ultrasound used (permanent image in chart),,,,  Narrative:  Start time: 04/18/2017 9:25 AM End time: 04/18/2017 9:35 AM Injection made incrementally with aspirations every 5 mL.  Performed by: Personally  Anesthesiologist: Murvin Natal, MD  Additional Notes: Functioning IV was confirmed and monitors were applied.  A 91mm 21ga Arrow echogenic stimulator needle was used. Sterile prep, hand hygiene and sterile gloves were used.  Negative aspiration and negative test dose prior to incremental administration of local anesthetic. The patient tolerated the procedure well.

## 2017-04-18 NOTE — Anesthesia Postprocedure Evaluation (Signed)
Anesthesia Post Note  Patient: Marvelene A Alcivar  Procedure(s) Performed: RIGHT TOTAL SHOULDER ARTHROPLASTY (Right Shoulder)     Patient location during evaluation: PACU Anesthesia Type: General and Regional Level of consciousness: awake and alert Pain management: pain level controlled Vital Signs Assessment: post-procedure vital signs reviewed and stable Respiratory status: spontaneous breathing, nonlabored ventilation, respiratory function stable and patient connected to nasal cannula oxygen Cardiovascular status: blood pressure returned to baseline and stable Postop Assessment: no apparent nausea or vomiting Anesthetic complications: no    Last Vitals:  Vitals:   04/18/17 1345 04/18/17 1400  BP:  128/72  Pulse: 75 75  Resp: 13 16  Temp: 36.6 C (!) 36.4 C  SpO2: 95% 99%    Last Pain:  Vitals:   04/18/17 1808  TempSrc:   PainSc: 1                  Sarena Jezek P Rebel Willcutt

## 2017-04-18 NOTE — Discharge Instructions (Signed)

## 2017-04-18 NOTE — Transfer of Care (Signed)
Immediate Anesthesia Transfer of Care Note  Patient: Leroy Libman  Procedure(s) Performed: RIGHT TOTAL SHOULDER ARTHROPLASTY (Right Shoulder)  Patient Location: PACU  Anesthesia Type:GA combined with regional for post-op pain  Level of Consciousness: awake, alert  and oriented  Airway & Oxygen Therapy: Patient Spontanous Breathing and Patient connected to nasal cannula oxygen  Post-op Assessment: Report given to RN and Post -op Vital signs reviewed and stable  Post vital signs: Reviewed and stable  Last Vitals:  Vitals:   04/18/17 0935 04/18/17 0940  BP: 121/70 120/64  Pulse: 66 67  Resp: 15 16  Temp:    SpO2: 95% 95%    Last Pain:  Vitals:   04/18/17 0935  TempSrc:   PainSc: 0-No pain      Patients Stated Pain Goal: 3 (33/58/25 1898)  Complications: No apparent anesthesia complications

## 2017-04-19 ENCOUNTER — Encounter (HOSPITAL_COMMUNITY): Payer: Self-pay | Admitting: Orthopedic Surgery

## 2017-04-19 MED ORDER — NAPROXEN 500 MG PO TABS: 500.0000 mg | ORAL_TABLET | Freq: Two times a day (BID) | ORAL | 1 refills | Status: DC

## 2017-04-19 MED ORDER — DIAZEPAM 5 MG PO TABS: 2.5000 mg | ORAL_TABLET | Freq: Four times a day (QID) | ORAL | 1 refills | Status: DC | PRN

## 2017-04-19 MED ORDER — OXYCODONE-ACETAMINOPHEN 5-325 MG PO TABS: 1.0000 | ORAL_TABLET | ORAL | 0 refills | Status: DC | PRN

## 2017-04-19 MED ORDER — ONDANSETRON HCL 4 MG PO TABS: 4.0000 mg | ORAL_TABLET | Freq: Three times a day (TID) | ORAL | 0 refills | Status: DC | PRN

## 2017-04-19 NOTE — Discharge Summary (Signed)
PATIENT ID:      [name]  MRN:     [id number] DOB/AGE:    07/29/1945 / 71 y.o.     DISCHARGE SUMMARY  ADMISSION DATE:    04/18/2017 DISCHARGE DATE:    ADMISSION DIAGNOSIS: Right shoulder osteoarthritis Past Medical History:  Diagnosis Date  . Arthritis   . Cataracts, bilateral    immature  . Chronic back pain    spondylolisthesis  . Complication of anesthesia    unable to get up due dizziness,nausea- stayed overnite  . Constipation    takes Colace daily  . Depression    but not on any meds  . History of colon polyps    benign one time and precancerous another  . History of kidney stones 12/2015  . Hyperlipidemia    takes Pravastatin daily  . Hypertension    takes Losartan daily  . Hypothyroidism    takes Synthroid daily  . Numbness    tingling/right lower leg   . PONV (postoperative nausea and vomiting)   . Shoulder arthritis    Right    DISCHARGE DIAGNOSIS:   Active Problems:   Status post total shoulder arthroplasty   PROCEDURE: Procedure(s): RIGHT TOTAL SHOULDER ARTHROPLASTY on 04/18/2017  CONSULTS:    HISTORY:  See H&P in chart.  HOSPITAL COURSE:  [name] is a 71 y.o. admitted on 04/18/2017 with a diagnosis of Right shoulder osteoarthritis.  They were brought to the operating room on 04/18/2017 and underwent Procedure(s): RIGHT TOTAL SHOULDER ARTHROPLASTY.    They were given perioperative antibiotics:  Anti-infectives (From admission, onward)   Start     Dose/Rate Route Frequency Ordered Stop   04/18/17 1530  ceFAZolin (ANCEF) IVPB 2g/100 mL premix     2 g 200 mL/hr over 30 Minutes Intravenous Every 6 hours 04/18/17 1258 04/19/17 0332   04/18/17 1000  ceFAZolin (ANCEF) IVPB 2g/100 mL premix     2 g 200 mL/hr over 30 Minutes Intravenous To ShortStay Surgical 04/17/17 0908 04/18/17 1050    .  Patient underwent the above named procedure and tolerated it well. The following day they were hemodynamically stable and pain was controlled on  oral analgesics. They were neurovascularly intact to the operative extremity. OT was ordered and worked with patient per protocol. They were medically and orthopaedically stable for discharge on day 1.    DIAGNOSTIC STUDIES:  RECENT RADIOGRAPHIC STUDIES :  No results found.  RECENT VITAL SIGNS:   Patient Vitals for the past 24 hrs:  BP Temp Temp src Pulse Resp SpO2 Height Weight  04/19/17 0500 102/60 97.6 F (36.4 C) Oral (!) 54 15 94 % - -  04/19/17 0046 (!) 114/91 98.2 F (36.8 C) Oral (!) 53 16 95 % - -  04/18/17 2100 111/64 (!) 97.5 F (36.4 C) Oral 73 14 92 % - -  04/18/17 1400 128/72 (!) 97.5 F (36.4 C) Oral 75 16 99 % - -  04/18/17 1345 - 97.9 F (36.6 C) - 75 13 95 % - -  04/18/17 1330 130/86 - - 74 11 95 % - -  04/18/17 1315 134/81 - - 76 10 97 % - -  04/18/17 1300 (!) 141/87 - - 76 20 97 % - -  04/18/17 1245 - - - 71 14 96 % - -  04/18/17 1240 (!) 143/85 (!) 97 F (36.1 C) - 75 18 97 % - -  04/18/17 0940 120/64 - - 67 16 95 % - -  04/18/17 0935 121/70 - - 66 15 95 % - -  04/18/17 0930 140/82 - - 66 16 96 % - -  04/18/17 0925 (!) 151/78 - - 61 11 98 % - -  04/18/17 0921 (!) 144/80 - - 61 (!) 8 98 % - -  04/18/17 0846 - - - - - - 5' 4.5" (1.638 m) 81.6 kg (180 lb)  .  RECENT EKG RESULTS:    Orders placed or performed during the hospital encounter of 04/12/17  . EKG 12 lead  . EKG 12 lead    DISCHARGE INSTRUCTIONS:  Discharge Instructions    Discontinue IV   Complete by:  As directed       DISCHARGE MEDICATIONS:   Allergies as of 04/19/2017   No Known Allergies     Medication List    TAKE these medications   acetaminophen 650 MG CR tablet Commonly known as:  TYLENOL Take 650-1,300 mg by mouth See admin instructions. Takes 1 tablet at breakfast and 1 at lunch and 2 at bedtime   aspirin EC 81 MG tablet Take 81 mg by mouth every other day.   diazepam 5 MG tablet Commonly known as:  VALIUM Take 0.5-1 tablets (2.5-5 mg total) by mouth every 6 (six)  hours as needed for muscle spasms or sedation.   diclofenac sodium 1 % Gel Commonly known as:  VOLTAREN Apply 1 application topically 3 (three) times daily as needed (joint pain).   estradiol 0.1 MG/GM vaginal cream Commonly known as:  ESTRACE Place 1 Applicatorful vaginally 3 (three) times a week.   Fish Oil 1200 MG Caps Take 1,200 mg by mouth every evening.   L-LYSINE PO Take 1 tablet by mouth daily.   levothyroxine 50 MCG tablet Commonly known as:  SYNTHROID, LEVOTHROID Take 50 mcg by mouth daily before breakfast.   losartan 25 MG tablet Commonly known as:  COZAAR TAKE 1 TABLET DAILY   naproxen 500 MG tablet Commonly known as:  NAPROSYN Take 1 tablet (500 mg total) by mouth 2 (two) times daily with a meal.   ondansetron 4 MG tablet Commonly known as:  ZOFRAN Take 1 tablet (4 mg total) by mouth every 8 (eight) hours as needed for nausea or vomiting.   oxyCODONE-acetaminophen 5-325 MG tablet Commonly known as:  PERCOCET Take 1 tablet by mouth every 4 (four) hours as needed.   polycarbophil 625 MG tablet Commonly known as:  FIBERCON Take 625 mg by mouth daily.   pravastatin 40 MG tablet Commonly known as:  PRAVACHOL Take 40 mg by mouth every evening.   triamcinolone ointment 0.1 % Commonly known as:  KENALOG Apply 1 application topically 3 (three) times daily as needed (foot itch).   VIACTIV PO Take 1 tablet by mouth daily.       FOLLOW UP VISIT:   Follow-up Information    Justice Britain, MD.   Specialty:  Orthopedic Surgery Why:  call to be seen in 10-14 days Contact information: [address] [place] [address] [phone]           DISCHARGE TO: Home   DISCHARGE CONDITION:  Festus Barren for Dr. Justice Britain 04/19/2017, 8:24 AM

## 2017-04-19 NOTE — Evaluation (Signed)
Occupational Therapy Evaluation Patient Details Name: [name] MRN: [id number] DOB: Dec 02, 1945 Today's Date: 04/19/2017    History of Present Illness 71 yo female s/p R Reverse TSA    Clinical Impression   Patient is s/p R Reverse TSA surgery resulting in functional limitations due to the deficits listed below (see OT problem list). Pt is able to complete all adls with precautions but with balance deficits noted. Pt requires (A) to help with balance during dressing and pendulum exercises.  Patient will benefit from skilled OT acutely to increase independence and safety with ADLS to allow discharge home.     Follow Up Recommendations  No OT follow up    Equipment Recommendations  None recommended by OT    Recommendations for Other Services       Precautions / Restrictions Precautions Precautions: Shoulder Type of Shoulder Precautions: AROm for adls use only/ pendulums okay Shoulder Interventions: Off for dressing/bathing/exercises Precaution Comments: handout from shoulder protocol provided Restrictions Weight Bearing Restrictions: Yes RUE Weight Bearing: Non weight bearing      Mobility Bed Mobility Overal bed mobility: Modified Independent                Transfers Overall transfer level: Modified independent                    Balance                                           ADL either performed or assessed with clinical judgement   ADL Overall ADL's : Needs assistance/impaired Eating/Feeding: Set up   Grooming: Set up   Upper Body Bathing: Minimal assistance   Lower Body Bathing: Minimal assistance Lower Body Bathing Details (indicate cue type and reason): able to hook feet into pants and (A) for balance standing         Toilet Transfer: Supervision/safety         Tub/Shower Transfer Details (indicate cue type and reason): advised to sponge bath only at this time due to balance deficits. pt with LOB x4 to the  L during session Functional mobility during ADLs: Supervision/safety General ADL Comments: pt with spouse (A) at all times on d/c  Advised to use new linen each and every shower and never to wash over incision site   Vision Patient Visual Report: No change from baseline       Perception     Praxis      Pertinent Vitals/Pain Pain Assessment: Faces Faces Pain Scale: Hurts a little bit Pain Location: R shoulder Pain Descriptors / Indicators: Sore;Operative site guarding Pain Intervention(s): Monitored during session;Repositioned;Premedicated before session     Hand Dominance Right   Extremity/Trunk Assessment Upper Extremity Assessment Upper Extremity Assessment: RUE deficits/detail RUE Deficits / Details: s/p surg   Lower Extremity Assessment Lower Extremity Assessment: Defer to PT evaluation   Cervical / Trunk Assessment Cervical / Trunk Assessment: Other exceptions Cervical / Trunk Exceptions: hx of DD per patient in her neck   Communication Communication Communication: No difficulties   Cognition Arousal/Alertness: Awake/alert   Overall Cognitive Status: Within Functional Limits for tasks assessed                                     General Comments  dressing dry and intact  Exercises  Pendulums ( educated no active movement) Lap slides adls with Abduction 45 FF 60 degrees education on range and functional use with functional task of dressing for home   Shoulder Instructions      Home Living Family/patient expects to be discharged to:: Private residence Living Arrangements: Spouse/significant other Available Help at Discharge: Family;Available 24 hours/day Type of Home: House Home Access: Stairs to enter [employer] of Steps: 6 Entrance Stairs-Rails: Left;Right Home Layout: One level     Bathroom Shower/Tub: Teacher, early years/pre: Handicapped height Bathroom Accessibility: Yes   Home Equipment: Cane - single  point;Bedside commode;Shower seat;Grab bars - tub/shower          Prior Functioning/Environment Level of Independence: Independent                 OT Problem List:        OT Treatment/Interventions:      OT Goals(Current goals can be found in the care plan section)    OT Frequency:     Barriers to D/C:            Co-evaluation              AM-PAC PT "6 Clicks" Daily Activity     Outcome Measure Help from another person eating meals?: None Help from another person taking care of personal grooming?: A Little Help from another person toileting, which includes using toliet, bedpan, or urinal?: A Little Help from another person bathing (including washing, rinsing, drying)?: A Little Help from another person to put on and taking off regular upper body clothing?: A Little Help from another person to put on and taking off regular lower body clothing?: A Little 6 Click Score: 19   End of Session Nurse Communication: Mobility status;Precautions;Weight bearing status  Activity Tolerance: Patient tolerated treatment well Patient left: in chair;with call bell/phone within reach;with family/visitor present                   Time: 6270-3500 OT Time Calculation (min): 20 min Charges:  OT General Charges $OT Visit: 1 Visit OT Evaluation $OT Eval Moderate Complexity: 1 Mod G-Codes:      [name]   OTR/L Pager: [phone] Office: [phone] .   Parke Poisson B 04/19/2017, 3:15 PM

## 2017-04-19 NOTE — Progress Notes (Signed)
Pt given prescriptions and discharge instructions. Instructions gone over with her and husband, answered all questions to satisfaction. All belongings gathered to be sent home. Pt's husband is taking her home. Pt in no distress at time of discharge.

## 2017-04-19 NOTE — Plan of Care (Signed)
  Activity: Risk for activity intolerance will decrease 04/19/2017 1201 - Adequate for Discharge by Governor Rooks, RN   Pain Managment: General experience of comfort will improve 04/19/2017 1201 - Adequate for Discharge by Governor Rooks, RN   Safety: Ability to remain free from injury will improve 04/19/2017 1201 - Adequate for Discharge by Governor Rooks, RN   Skin Integrity: Risk for impaired skin integrity will decrease 04/19/2017 1201 - Completed/Met by Governor Rooks, RN

## 2017-04-19 NOTE — Plan of Care (Signed)
  Progressing Nutrition: Adequate nutrition will be maintained 04/19/2017 0736 - Progressing by West Pugh, RN Elimination: Will not experience complications related to bowel motility 04/19/2017 0736 - Progressing by West Pugh, RN Pain Managment: General experience of comfort will improve 04/19/2017 0736 - Progressing by West Pugh, RN Safety: Ability to remain free from injury will improve 04/19/2017 0736 - Progressing by West Pugh, RN

## 2017-04-19 NOTE — Op Note (Signed)
NAME:  [name], [name]                     ACCOUNT NO.:  MEDICAL RECORD NO.:  [id number]  LOCATION:                                 FACILITY:  PHYSICIAN:  Metta Clines. Kewan Mcnease, M.D.       DATE OF BIRTH:  DATE OF PROCEDURE:  04/18/2017 DATE OF DISCHARGE:                              OPERATIVE REPORT   PREOPERATIVE DIAGNOSIS:  End-stage right shoulder osteoarthritis.  POSTOPERATIVE DIAGNOSIS:  End-stage right shoulder osteoarthritis.  PROCEDURE:  Right total shoulder arthroplasty utilizing a press-fit size 6 Arthrex stem with a 42 x 17 eccentric head and a medium glenoid.  SURGEON:  Metta Clines. Celedonio Sortino, M.D.  Terrence DupontOlivia Mackie A. Shuford, P.A.-C.  ANESTHESIA:  General endotracheal plus interscalene block.  ESTIMATED BLOOD LOSS:  150 mL.  DRAINS:  None.  HISTORY:  [name] is a 71 year old female, who has had chronic and progressive increasing right shoulder pain related to end-stage osteoarthritis.  She is well known to our practice after a previous left total shoulder arthroplasty, which has done very well with.  She now presents for planned right total shoulder arthroplasty.  Preoperatively, I counseled [name] regarding treatment options, potential risks versus benefits thereof.  Possible surgical complications were all reviewed including potential for bleeding, infection, neurovascular injury, persistent pain, loss of motion, anesthetic complication, failure of the implant, and possible need for additional surgery.  She understands and accepts and agrees with our planned procedure.  PROCEDURE IN DETAIL:  After undergoing routine preop evaluation, the patient received prophylactic antibiotics and an interscalene block was established in the holding area by the Anesthesia Department.  Placed supine on the operating table, underwent smooth induction of a general endotracheal anesthesia.  Placed in the beach-chair position and appropriately padded and protected.  The right  shoulder girdle region was then sterilely prepped and draped in standard fashion.  Time-out was called.  Anterior deltopectoral approach to the right shoulder was made through an 8 cm incision.  Skin flaps were elevated, dissection carried deeply, electrocautery used for hemostasis.  The deltopectoral interval was then developed from proximal to distal with the vein taken laterally.  Conjoint tendon mobilized, retracted medially, the upper centimeter of the pec major was tenotomized to improve exposure.  We then tenodesed the long head biceps tendon at the upper border of the pec major tendon, then tenotomized the tendon proximally and removed the proximal segment, which was unroofed and then we split the rotator cuff along the rotator interval at the base of the coracoid and then separated the subscapularis away from the lesser tuberosity leaving a 1- 2 cm cuff of tissue for later repair.  The free margin of the subscapularis was tagged with a series of figure-of-eight #2 FiberWire grasping sutures.  We then divided the capsular attachments from the anteroinferior and inferior aspects of the humeral neck allowing delivery of the humeral head through the wound.  We outlined our proposed humeral head resection maintaining the native retroversion approximately 30 degrees and we resected the humeral head using the oscillating saw carefully protecting the rotator cuff superiorly and posteriorly.  We then removed the osteophytes from the margin of  the humeral neck.  We prepared the humeral shaft, hand reaming to size 6, broaching to size 6, which had excellent fit and fixation.  This is the same size we had used on the opposite shoulder.  Size 5 stem with cap placed into the humeral canal.  We then exposed the glenoid with a combination of Fukuda, pitchfork, and snake tongue retractors.  We performed a circumferential labral resection and also mobilized subscapularis and removed the proximal  stump of the biceps tendon.  Once complete visualization of the glenoid was achieved, we placed a guidewire into the center, introduced this at appropriate level and we sized this as a medium.  The medium reamer was then used to obtain a stable subchondral bony bed for the glenoid.  Central drill hole was placed, followed by the superior and inferior peg and slot respectively. The glenoid was broached and trial showed excellent fit.  At this point, the glenoid was copiously irrigated, cleaned, and dried.  We mixed cement and this was introduced into the superior and inferior peg and slot respectively.  We impacted our glenoid with excellent fit and fixation.  We then returned our attention back to the proximal humerus, where the size 6 stem was then impacted into the humeral shaft with excellent interference, fit and fixation.  The proximal locking screws were then tightened.  We then performed trial reductions and the 42 x 17 eccentric head showed excellent coverage and good soft tissue balance. The trial was removed.  The final humeral head was impacted onto the [facility] taper, which we cleaned and dried.  Final reduction was then performed, which showed excellent soft tissue balance, good shoulder stability and motion.  We then repaired the subscapularis back to the cuff of tissue on the lesser tuberosity using #2 FiberWires in a horizontal mattress pattern.  Also repaired the rotator interval with a series of 3 figure-of-eight #2 FiberWire sutures.  Also placed additional FiberWire at the more inferior aspect of the subscapularis to reinforce the repair.  At this point, the arm easily achieved 45 degrees of external rotation.  The wounds were copiously irrigated.  Hemostasis was obtained.  The deltopectoral interval was then reapproximated with a series of figure-of-eight #1 Vicryl sutures.  2-0 Vicryl was used for subcu layer, intracuticular Monocryl for the skin, followed by  Dermabond and Aquacel dressing.  Right arm was placed in a sling.  The patient was awakened, extubated, and taken to the recovery room in stable condition.  [name], P.A.-C, was used as an Environmental consultant throughout this case, essential for help with positioning the patient, positioning the extremity, tissue manipulation, implantation of prosthesis, wound closure, and intraoperative decision making.     Metta Clines. Gershom Brobeck, M.D.     KMS/MEDQ  D:  04/18/2017  T:  04/18/2017  Job:  [id number]

## 2017-04-24 DIAGNOSIS — Z96612 Presence of left artificial shoulder joint: Secondary | ICD-10-CM | POA: Diagnosis not present

## 2017-04-24 DIAGNOSIS — Z471 Aftercare following joint replacement surgery: Secondary | ICD-10-CM | POA: Diagnosis not present

## 2017-04-24 DIAGNOSIS — Z96611 Presence of right artificial shoulder joint: Secondary | ICD-10-CM | POA: Diagnosis not present

## 2017-04-24 DIAGNOSIS — M25511 Pain in right shoulder: Secondary | ICD-10-CM | POA: Diagnosis not present

## 2017-04-26 DIAGNOSIS — E559 Vitamin D deficiency, unspecified: Secondary | ICD-10-CM | POA: Diagnosis not present

## 2017-04-26 DIAGNOSIS — W57XXXA Bitten or stung by nonvenomous insect and other nonvenomous arthropods, initial encounter: Secondary | ICD-10-CM | POA: Diagnosis not present

## 2017-05-20 DIAGNOSIS — M19011 Primary osteoarthritis, right shoulder: Secondary | ICD-10-CM | POA: Diagnosis not present

## 2017-05-20 DIAGNOSIS — M25511 Pain in right shoulder: Secondary | ICD-10-CM | POA: Diagnosis not present

## 2017-05-24 DIAGNOSIS — M19011 Primary osteoarthritis, right shoulder: Secondary | ICD-10-CM | POA: Diagnosis not present

## 2017-05-24 DIAGNOSIS — M25511 Pain in right shoulder: Secondary | ICD-10-CM | POA: Diagnosis not present

## 2017-05-27 DIAGNOSIS — M19011 Primary osteoarthritis, right shoulder: Secondary | ICD-10-CM | POA: Diagnosis not present

## 2017-05-27 DIAGNOSIS — M25511 Pain in right shoulder: Secondary | ICD-10-CM | POA: Diagnosis not present

## 2017-05-29 DIAGNOSIS — Z471 Aftercare following joint replacement surgery: Secondary | ICD-10-CM | POA: Diagnosis not present

## 2017-05-29 DIAGNOSIS — Z96612 Presence of left artificial shoulder joint: Secondary | ICD-10-CM | POA: Diagnosis not present

## 2017-05-29 DIAGNOSIS — S46091A Other injury of muscle(s) and tendon(s) of the rotator cuff of right shoulder, initial encounter: Secondary | ICD-10-CM | POA: Diagnosis not present

## 2017-05-30 DIAGNOSIS — M19011 Primary osteoarthritis, right shoulder: Secondary | ICD-10-CM | POA: Diagnosis not present

## 2017-05-30 DIAGNOSIS — M25511 Pain in right shoulder: Secondary | ICD-10-CM | POA: Diagnosis not present

## 2017-06-11 DIAGNOSIS — E785 Hyperlipidemia, unspecified: Secondary | ICD-10-CM | POA: Diagnosis not present

## 2017-06-11 DIAGNOSIS — Z9181 History of falling: Secondary | ICD-10-CM | POA: Diagnosis not present

## 2017-06-11 DIAGNOSIS — Z136 Encounter for screening for cardiovascular disorders: Secondary | ICD-10-CM | POA: Diagnosis not present

## 2017-06-11 DIAGNOSIS — E669 Obesity, unspecified: Secondary | ICD-10-CM | POA: Diagnosis not present

## 2017-06-11 DIAGNOSIS — Z0000 Encounter for general adult medical examination without abnormal findings: Secondary | ICD-10-CM | POA: Diagnosis not present

## 2017-06-11 DIAGNOSIS — Z1211 Encounter for screening for malignant neoplasm of colon: Secondary | ICD-10-CM | POA: Diagnosis not present

## 2017-06-11 DIAGNOSIS — Z6831 Body mass index (BMI) 31.0-31.9, adult: Secondary | ICD-10-CM | POA: Diagnosis not present

## 2017-06-11 DIAGNOSIS — I10 Essential (primary) hypertension: Secondary | ICD-10-CM | POA: Diagnosis not present

## 2017-06-12 ENCOUNTER — Other Ambulatory Visit: Payer: Self-pay

## 2017-06-12 ENCOUNTER — Encounter (HOSPITAL_COMMUNITY): Payer: Self-pay | Admitting: *Deleted

## 2017-06-12 MED ORDER — TRANEXAMIC ACID 1000 MG/10ML IV SOLN
1000.0000 mg | INTRAVENOUS | Status: DC
  Filled 2017-06-12: qty 10

## 2017-06-12 NOTE — Anesthesia Preprocedure Evaluation (Addendum)
Anesthesia Evaluation  Patient identified by MRN, date of birth, ID band Patient awake    Reviewed: Allergy & Precautions, NPO status , Patient's Chart, lab work & pertinent test results  History of Anesthesia Complications (+) PONV and history of anesthetic complications  Airway Mallampati: II  TM Distance: >3 FB Neck ROM: Full    Dental no notable dental hx.    Pulmonary neg pulmonary ROS,    Pulmonary exam normal breath sounds clear to auscultation       Cardiovascular hypertension, Pt. on medications Normal cardiovascular exam Rhythm:Regular Rate:Normal  ECG: NSR, rate 60   Neuro/Psych PSYCHIATRIC DISORDERS Depression negative neurological ROS     GI/Hepatic negative GI ROS, Neg liver ROS,   Endo/Other  Hypothyroidism   Renal/GU negative Renal ROS     Musculoskeletal  (+) Arthritis , Chronic back pain   Abdominal (+) + obese,   Peds  Hematology HLD   Anesthesia Other Findings Failed right total shoulder arthroplasty  Reproductive/Obstetrics                           Anesthesia Physical Anesthesia Plan  ASA: II  Anesthesia Plan: General and Regional   Post-op Pain Management: GA combined w/ Regional for post-op pain   Induction: Intravenous  PONV Risk Score and Plan: 3 and Dexamethasone, Ondansetron, Midazolam and Treatment may vary due to age or medical condition  Airway Management Planned: Oral ETT  Additional Equipment:   Intra-op Plan:   Post-operative Plan: Extubation in OR  Informed Consent: I have reviewed the patients History and Physical, chart, labs and discussed the procedure including the risks, benefits and alternatives for the proposed anesthesia with the patient or authorized representative who has indicated his/her understanding and acceptance.   Dental advisory given  Plan Discussed with: CRNA  Anesthesia Plan Comments:         Anesthesia Quick  Evaluation

## 2017-06-12 NOTE — Progress Notes (Signed)
Spoke with pt for pre-op call. Pt had surgery here in December. Pt states nothing has changed with her medical and surgical history. She denies any chest pain or sob.

## 2017-06-13 ENCOUNTER — Inpatient Hospital Stay (HOSPITAL_COMMUNITY): Payer: Medicare Other | Admitting: Anesthesiology

## 2017-06-13 ENCOUNTER — Encounter (HOSPITAL_COMMUNITY): Payer: Self-pay | Admitting: Urology

## 2017-06-13 ENCOUNTER — Inpatient Hospital Stay (HOSPITAL_COMMUNITY)
Admission: RE | Admit: 2017-06-13 | Discharge: 2017-06-14 | DRG: 483 | Disposition: A | Discharge status: Home / Self Care | Payer: Medicare Other | Source: Ambulatory Visit | Attending: Orthopedic Surgery | Admitting: Orthopedic Surgery

## 2017-06-13 ENCOUNTER — Encounter (HOSPITAL_COMMUNITY)
Admission: RE | Disposition: A | Discharge status: Home / Self Care | Payer: Self-pay | Source: Ambulatory Visit | Attending: Orthopedic Surgery

## 2017-06-13 ENCOUNTER — Other Ambulatory Visit: Payer: Self-pay

## 2017-06-13 DIAGNOSIS — Z791 Long term (current) use of non-steroidal anti-inflammatories (NSAID): Secondary | ICD-10-CM

## 2017-06-13 DIAGNOSIS — M25511 Pain in right shoulder: Secondary | ICD-10-CM | POA: Diagnosis not present

## 2017-06-13 DIAGNOSIS — G8918 Other acute postprocedural pain: Secondary | ICD-10-CM | POA: Diagnosis not present

## 2017-06-13 DIAGNOSIS — T84028A Dislocation of other internal joint prosthesis, initial encounter: Secondary | ICD-10-CM | POA: Diagnosis not present

## 2017-06-13 DIAGNOSIS — Z7982 Long term (current) use of aspirin: Secondary | ICD-10-CM

## 2017-06-13 DIAGNOSIS — E669 Obesity, unspecified: Secondary | ICD-10-CM | POA: Diagnosis present

## 2017-06-13 DIAGNOSIS — Z96611 Presence of right artificial shoulder joint: Secondary | ICD-10-CM | POA: Diagnosis present

## 2017-06-13 DIAGNOSIS — E039 Hypothyroidism, unspecified: Secondary | ICD-10-CM | POA: Diagnosis present

## 2017-06-13 DIAGNOSIS — I10 Essential (primary) hypertension: Secondary | ICD-10-CM | POA: Diagnosis present

## 2017-06-13 DIAGNOSIS — K5900 Constipation, unspecified: Secondary | ICD-10-CM | POA: Diagnosis present

## 2017-06-13 DIAGNOSIS — W19XXXA Unspecified fall, initial encounter: Secondary | ICD-10-CM | POA: Diagnosis present

## 2017-06-13 DIAGNOSIS — Z6830 Body mass index (BMI) 30.0-30.9, adult: Secondary | ICD-10-CM

## 2017-06-13 DIAGNOSIS — Z79890 Hormone replacement therapy (postmenopausal): Secondary | ICD-10-CM | POA: Diagnosis not present

## 2017-06-13 DIAGNOSIS — T84098A Other mechanical complication of other internal joint prosthesis, initial encounter: Secondary | ICD-10-CM | POA: Diagnosis present

## 2017-06-13 DIAGNOSIS — Z96619 Presence of unspecified artificial shoulder joint: Secondary | ICD-10-CM

## 2017-06-13 DIAGNOSIS — Y792 Prosthetic and other implants, materials and accessory orthopedic devices associated with adverse incidents: Secondary | ICD-10-CM | POA: Diagnosis present

## 2017-06-13 DIAGNOSIS — Z96612 Presence of left artificial shoulder joint: Secondary | ICD-10-CM | POA: Diagnosis present

## 2017-06-13 DIAGNOSIS — S46011A Strain of muscle(s) and tendon(s) of the rotator cuff of right shoulder, initial encounter: Secondary | ICD-10-CM | POA: Diagnosis present

## 2017-06-13 DIAGNOSIS — S46091A Other injury of muscle(s) and tendon(s) of the rotator cuff of right shoulder, initial encounter: Secondary | ICD-10-CM | POA: Diagnosis not present

## 2017-06-13 DIAGNOSIS — E785 Hyperlipidemia, unspecified: Secondary | ICD-10-CM | POA: Diagnosis present

## 2017-06-13 DIAGNOSIS — T8489XA Other specified complication of internal orthopedic prosthetic devices, implants and grafts, initial encounter: Secondary | ICD-10-CM | POA: Diagnosis not present

## 2017-06-13 HISTORY — PX: SHOULDER SURGERY: SHX246

## 2017-06-13 HISTORY — PX: REVERSE SHOULDER ARTHROPLASTY: SHX5054

## 2017-06-13 LAB — CBC
HCT: 40.1 % (ref 36.0–46.0)
HEMOGLOBIN: 13 g/dL (ref 12.0–15.0)
MCH: 30.8 pg (ref 26.0–34.0)
MCHC: 32.4 g/dL (ref 30.0–36.0)
MCV: 95 fL (ref 78.0–100.0)
PLATELETS: 293 10*3/uL (ref 150–400)
RBC: 4.22 MIL/uL (ref 3.87–5.11)
RDW: 13.8 % (ref 11.5–15.5)
WBC: 7.3 10*3/uL (ref 4.0–10.5)

## 2017-06-13 LAB — BASIC METABOLIC PANEL
ANION GAP: 12 (ref 5–15)
BUN: 22 mg/dL — ABNORMAL HIGH (ref 6–20)
CALCIUM: 9.3 mg/dL (ref 8.9–10.3)
CO2: 21 mmol/L — ABNORMAL LOW (ref 22–32)
CREATININE: 0.84 mg/dL (ref 0.44–1.00)
Chloride: 106 mmol/L (ref 101–111)
Glucose, Bld: 86 mg/dL (ref 65–99)
Potassium: 3.9 mmol/L (ref 3.5–5.1)
SODIUM: 139 mmol/L (ref 135–145)

## 2017-06-13 LAB — TYPE AND SCREEN
ABO/RH(D): B POS
ANTIBODY SCREEN: NEGATIVE

## 2017-06-13 SURGERY — ARTHROPLASTY, SHOULDER, TOTAL, REVERSE
Anesthesia: Regional | Site: Shoulder | Laterality: Right

## 2017-06-13 MED ORDER — LOSARTAN POTASSIUM 50 MG PO TABS
50.0000 mg | ORAL_TABLET | Freq: Every day | ORAL | Status: DC
  Administered 2017-06-13 – 2017-06-14 (×2): 50 mg via ORAL
  Filled 2017-06-13 (×2): qty 1

## 2017-06-13 MED ORDER — LIDOCAINE HCL (CARDIAC) 20 MG/ML IV SOLN
INTRAVENOUS | Status: DC | PRN
  Administered 2017-06-13: 6 mg via INTRAVENOUS

## 2017-06-13 MED ORDER — CHLORHEXIDINE GLUCONATE 4 % EX LIQD: 60.0000 mL | Freq: Once | CUTANEOUS | Status: DC

## 2017-06-13 MED ORDER — DIAZEPAM 5 MG PO TABS
2.5000 mg | ORAL_TABLET | Freq: Four times a day (QID) | ORAL | Status: DC | PRN
  Filled 2017-06-13: qty 1

## 2017-06-13 MED ORDER — ASPIRIN EC 81 MG PO TBEC
81.0000 mg | DELAYED_RELEASE_TABLET | ORAL | Status: DC
  Administered 2017-06-13 – 2017-06-14 (×2): 81 mg via ORAL
  Filled 2017-06-13 (×2): qty 1

## 2017-06-13 MED ORDER — MAGNESIUM CITRATE PO SOLN: 1.0000 | Freq: Once | ORAL | Status: DC | PRN

## 2017-06-13 MED ORDER — MENTHOL 3 MG MT LOZG: 1.0000 | LOZENGE | OROMUCOSAL | Status: DC | PRN

## 2017-06-13 MED ORDER — OXYCODONE HCL 5 MG PO TABS
ORAL_TABLET | ORAL | Status: AC
  Filled 2017-06-13: qty 2

## 2017-06-13 MED ORDER — DOCUSATE SODIUM 100 MG PO CAPS
100.0000 mg | ORAL_CAPSULE | Freq: Two times a day (BID) | ORAL | Status: DC
  Administered 2017-06-13 – 2017-06-14 (×3): 100 mg via ORAL
  Filled 2017-06-13 (×3): qty 1

## 2017-06-13 MED ORDER — ONDANSETRON HCL 4 MG PO TABS: 4.0000 mg | ORAL_TABLET | Freq: Four times a day (QID) | ORAL | Status: DC | PRN

## 2017-06-13 MED ORDER — PHENYLEPHRINE HCL 10 MG/ML IJ SOLN
INTRAVENOUS | Status: DC | PRN
  Administered 2017-06-13: 20 ug/min via INTRAVENOUS

## 2017-06-13 MED ORDER — MIDAZOLAM HCL 5 MG/5ML IJ SOLN
INTRAMUSCULAR | Status: DC | PRN
  Administered 2017-06-13: 1 mg via INTRAVENOUS

## 2017-06-13 MED ORDER — HYDROMORPHONE HCL 1 MG/ML IJ SOLN: 0.5000 mg | INTRAMUSCULAR | Status: DC | PRN

## 2017-06-13 MED ORDER — KETOROLAC TROMETHAMINE 15 MG/ML IJ SOLN
7.5000 mg | Freq: Four times a day (QID) | INTRAMUSCULAR | Status: DC
  Administered 2017-06-13 (×3): 7.5 mg via INTRAVENOUS
  Filled 2017-06-13 (×3): qty 1

## 2017-06-13 MED ORDER — PROPOFOL 10 MG/ML IV BOLUS
INTRAVENOUS | Status: AC
  Filled 2017-06-13: qty 40

## 2017-06-13 MED ORDER — LEVOTHYROXINE SODIUM 50 MCG PO TABS
50.0000 ug | ORAL_TABLET | Freq: Every day | ORAL | Status: DC
  Administered 2017-06-14: 50 ug via ORAL
  Filled 2017-06-13 (×2): qty 1

## 2017-06-13 MED ORDER — BISACODYL 5 MG PO TBEC: 5.0000 mg | DELAYED_RELEASE_TABLET | Freq: Every day | ORAL | Status: DC | PRN

## 2017-06-13 MED ORDER — ONDANSETRON HCL 4 MG/2ML IJ SOLN
4.0000 mg | Freq: Four times a day (QID) | INTRAMUSCULAR | Status: DC | PRN
Start: 2017-06-13 — End: 2017-06-14
  Administered 2017-06-13: 4 mg via INTRAVENOUS
  Filled 2017-06-13: qty 2

## 2017-06-13 MED ORDER — PHENOL 1.4 % MT LIQD: 1.0000 | OROMUCOSAL | Status: DC | PRN

## 2017-06-13 MED ORDER — LACTATED RINGERS IV SOLN
INTRAVENOUS | Status: DC | PRN
  Administered 2017-06-13: 07:00:00 via INTRAVENOUS

## 2017-06-13 MED ORDER — ROCURONIUM BROMIDE 100 MG/10ML IV SOLN
INTRAVENOUS | Status: DC | PRN
  Administered 2017-06-13 (×2): 20 mg via INTRAVENOUS
  Administered 2017-06-13: 40 mg via INTRAVENOUS

## 2017-06-13 MED ORDER — SUGAMMADEX SODIUM 200 MG/2ML IV SOLN
INTRAVENOUS | Status: AC
  Filled 2017-06-13: qty 2

## 2017-06-13 MED ORDER — DIPHENHYDRAMINE HCL 12.5 MG/5ML PO ELIX: 12.5000 mg | ORAL_SOLUTION | ORAL | Status: DC | PRN

## 2017-06-13 MED ORDER — FENTANYL CITRATE (PF) 250 MCG/5ML IJ SOLN
INTRAMUSCULAR | Status: AC
  Filled 2017-06-13: qty 5

## 2017-06-13 MED ORDER — FENTANYL CITRATE (PF) 100 MCG/2ML IJ SOLN
25.0000 ug | INTRAMUSCULAR | Status: DC | PRN
  Administered 2017-06-13 (×2): 50 ug via INTRAVENOUS

## 2017-06-13 MED ORDER — LACTATED RINGERS IV SOLN
INTRAVENOUS | Status: DC
  Administered 2017-06-13: 75 mL/h via INTRAVENOUS

## 2017-06-13 MED ORDER — TRANEXAMIC ACID 1000 MG/10ML IV SOLN
INTRAVENOUS | Status: DC | PRN
  Administered 2017-06-13: 1000 mg via INTRAVENOUS

## 2017-06-13 MED ORDER — PROPOFOL 10 MG/ML IV BOLUS
INTRAVENOUS | Status: DC | PRN
  Administered 2017-06-13: 120 mg via INTRAVENOUS

## 2017-06-13 MED ORDER — TRAMADOL HCL 50 MG PO TABS
50.0000 mg | ORAL_TABLET | Freq: Four times a day (QID) | ORAL | Status: DC | PRN
  Administered 2017-06-14: 50 mg via ORAL
  Filled 2017-06-13: qty 1

## 2017-06-13 MED ORDER — POLYETHYLENE GLYCOL 3350 17 G PO PACK: 17.0000 g | PACK | Freq: Every day | ORAL | Status: DC | PRN

## 2017-06-13 MED ORDER — FENTANYL CITRATE (PF) 100 MCG/2ML IJ SOLN
INTRAMUSCULAR | Status: AC
  Filled 2017-06-13: qty 2

## 2017-06-13 MED ORDER — DEXAMETHASONE SODIUM PHOSPHATE 10 MG/ML IJ SOLN
INTRAMUSCULAR | Status: DC | PRN
  Administered 2017-06-13: 10 mg via INTRAVENOUS

## 2017-06-13 MED ORDER — ACETAMINOPHEN 325 MG PO TABS
650.0000 mg | ORAL_TABLET | ORAL | Status: DC | PRN
  Administered 2017-06-13: 650 mg via ORAL
  Filled 2017-06-13: qty 2

## 2017-06-13 MED ORDER — MIDAZOLAM HCL 2 MG/2ML IJ SOLN
INTRAMUSCULAR | Status: AC
  Filled 2017-06-13: qty 2

## 2017-06-13 MED ORDER — SUGAMMADEX SODIUM 500 MG/5ML IV SOLN
INTRAVENOUS | Status: DC | PRN
  Administered 2017-06-13: 200 mg via INTRAVENOUS

## 2017-06-13 MED ORDER — ACETAMINOPHEN 650 MG RE SUPP: 650.0000 mg | RECTAL | Status: DC | PRN

## 2017-06-13 MED ORDER — BUPIVACAINE HCL (PF) 0.5 % IJ SOLN
INTRAMUSCULAR | Status: DC | PRN
  Administered 2017-06-13: 10 mL via PERINEURAL

## 2017-06-13 MED ORDER — CEFAZOLIN SODIUM-DEXTROSE 2-4 GM/100ML-% IV SOLN
2.0000 g | INTRAVENOUS | Status: AC
  Administered 2017-06-13: 2 g via INTRAVENOUS
  Filled 2017-06-13: qty 100

## 2017-06-13 MED ORDER — OXYCODONE HCL 5 MG PO TABS
5.0000 mg | ORAL_TABLET | ORAL | Status: DC | PRN
  Administered 2017-06-13 (×4): 10 mg via ORAL
  Filled 2017-06-13 (×2): qty 2

## 2017-06-13 MED ORDER — METOCLOPRAMIDE HCL 5 MG/ML IJ SOLN
5.0000 mg | Freq: Three times a day (TID) | INTRAMUSCULAR | Status: DC | PRN
  Filled 2017-06-13: qty 2

## 2017-06-13 MED ORDER — ONDANSETRON HCL 4 MG/2ML IJ SOLN: 4.0000 mg | Freq: Once | INTRAMUSCULAR | Status: DC | PRN

## 2017-06-13 MED ORDER — 0.9 % SODIUM CHLORIDE (POUR BTL) OPTIME
TOPICAL | Status: DC | PRN
  Administered 2017-06-13: 1000 mL

## 2017-06-13 MED ORDER — ONDANSETRON HCL 4 MG/2ML IJ SOLN
INTRAMUSCULAR | Status: DC | PRN
  Administered 2017-06-13: 4 mg via INTRAVENOUS

## 2017-06-13 MED ORDER — HYDROCODONE-ACETAMINOPHEN 5-325 MG PO TABS
1.0000 | ORAL_TABLET | ORAL | Status: DC | PRN
  Administered 2017-06-14: 1 via ORAL
  Filled 2017-06-13: qty 1

## 2017-06-13 MED ORDER — BUPIVACAINE LIPOSOME 1.3 % IJ SUSP
INTRAMUSCULAR | Status: DC | PRN
  Administered 2017-06-13: 10 mL via PERINEURAL

## 2017-06-13 MED ORDER — FENTANYL CITRATE (PF) 100 MCG/2ML IJ SOLN
INTRAMUSCULAR | Status: DC | PRN
  Administered 2017-06-13 (×2): 50 ug via INTRAVENOUS

## 2017-06-13 MED ORDER — CEFAZOLIN SODIUM-DEXTROSE 2-4 GM/100ML-% IV SOLN
2.0000 g | Freq: Four times a day (QID) | INTRAVENOUS | Status: AC
  Administered 2017-06-13: 2 g via INTRAVENOUS
  Filled 2017-06-13 (×3): qty 100

## 2017-06-13 MED ORDER — ALUM & MAG HYDROXIDE-SIMETH 200-200-20 MG/5ML PO SUSP: 30.0000 mL | ORAL | Status: DC | PRN

## 2017-06-13 MED ORDER — METOCLOPRAMIDE HCL 5 MG PO TABS: 5.0000 mg | ORAL_TABLET | Freq: Three times a day (TID) | ORAL | Status: DC | PRN

## 2017-06-13 SURGICAL SUPPLY — 65 items
ADH SKN CLS APL DERMABOND .7 (GAUZE/BANDAGES/DRESSINGS) ×1
AID PSTN UNV HD RSTRNT DISP (MISCELLANEOUS) ×1
BASEPLATE MOD 24 (Plate) ×2 IMPLANT
BLADE SAW SGTL 83.5X18.5 (BLADE) ×2 IMPLANT
COVER SURGICAL LIGHT HANDLE (MISCELLANEOUS) ×2 IMPLANT
CUP SUT UNIV REVERS 36 NEUTRAL (Cup) ×2 IMPLANT
DERMABOND ADVANCED (GAUZE/BANDAGES/DRESSINGS) ×1
DERMABOND ADVANCED .7 DNX12 (GAUZE/BANDAGES/DRESSINGS) ×1 IMPLANT
DRAPE ORTHO SPLIT 77X108 STRL (DRAPES) ×2
DRAPE SURG 17X11 SM STRL (DRAPES) ×2 IMPLANT
DRAPE SURG ORHT 6 SPLT 77X108 (DRAPES) ×2 IMPLANT
DRAPE U-SHAPE 47X51 STRL (DRAPES) ×2 IMPLANT
DRSG AQUACEL AG ADV 3.5X10 (GAUZE/BANDAGES/DRESSINGS) ×2 IMPLANT
DURAPREP 26ML APPLICATOR (WOUND CARE) ×2 IMPLANT
ELECT BLADE 4.0 EZ CLEAN MEGAD (MISCELLANEOUS) ×2
ELECT CAUTERY BLADE 6.4 (BLADE) ×2 IMPLANT
ELECT REM PT RETURN 9FT ADLT (ELECTROSURGICAL) ×2
ELECTRODE BLDE 4.0 EZ CLN MEGD (MISCELLANEOUS) ×1 IMPLANT
ELECTRODE REM PT RTRN 9FT ADLT (ELECTROSURGICAL) ×1 IMPLANT
FACESHIELD WRAPAROUND (MASK) ×8 IMPLANT
GLENOSPHERE 36 +4 LAT/24 (Joint) ×2 IMPLANT
GLOVE BIO SURGEON STRL SZ7.5 (GLOVE) ×2 IMPLANT
GLOVE BIO SURGEON STRL SZ8 (GLOVE) ×2 IMPLANT
GLOVE EUDERMIC 7 POWDERFREE (GLOVE) ×2 IMPLANT
GLOVE SS BIOGEL STRL SZ 7.5 (GLOVE) ×1 IMPLANT
GLOVE SUPERSENSE BIOGEL SZ 7.5 (GLOVE) ×1
GOWN STRL REUS W/ TWL LRG LVL3 (GOWN DISPOSABLE) ×2 IMPLANT
GOWN STRL REUS W/ TWL XL LVL3 (GOWN DISPOSABLE) ×2 IMPLANT
GOWN STRL REUS W/TWL LRG LVL3 (GOWN DISPOSABLE) ×4
GOWN STRL REUS W/TWL XL LVL3 (GOWN DISPOSABLE) ×2
HYDROGEN PEROXIDE 16OZ (MISCELLANEOUS) ×2 IMPLANT
INSERT HUMERAL 36 +6 (Shoulder) ×2 IMPLANT
KIT BASIN OR (CUSTOM PROCEDURE TRAY) ×2 IMPLANT
KIT ROOM TURNOVER OR (KITS) ×2 IMPLANT
MANIFOLD NEPTUNE II (INSTRUMENTS) ×2 IMPLANT
NDL SUT 6 .5 CRC .975X.05 MAYO (NEEDLE) IMPLANT
NEEDLE MAYO TAPER (NEEDLE)
NEEDLE TAPERED W/ NITINOL LOOP (MISCELLANEOUS) ×2 IMPLANT
NS IRRIG 1000ML POUR BTL (IV SOLUTION) ×2 IMPLANT
PACK SHOULDER (CUSTOM PROCEDURE TRAY) ×2 IMPLANT
PAD ARMBOARD 7.5X6 YLW CONV (MISCELLANEOUS) ×4 IMPLANT
PIN GUIDE 2.8MM (PIN) ×2 IMPLANT
RESTRAINT HEAD UNIVERSAL NS (MISCELLANEOUS) ×2 IMPLANT
SCREW CENTRAL MOD 35 (Screw) ×2 IMPLANT
SCREW PERI LOCK 5.5X16 (Screw) ×2 IMPLANT
SCREW PERI LOCK 5.5X24 (Screw) ×2 IMPLANT
SCREW PERIPHERAL 4.5X24 N/L (Screw) ×2 IMPLANT
SCREW PERIPHERAL 5.5X28 LOCK (Screw) ×2 IMPLANT
SLING ARM FOAM STRAP LRG (SOFTGOODS) ×2 IMPLANT
SPONGE LAP 18X18 X RAY DECT (DISPOSABLE) ×2 IMPLANT
SPONGE LAP 4X18 X RAY DECT (DISPOSABLE) ×2 IMPLANT
STEM HUMERAL UNI REVERS SZ6 (Stem) ×2 IMPLANT
SUCTION FRAZIER HANDLE 10FR (MISCELLANEOUS) ×1
SUCTION TUBE FRAZIER 10FR DISP (MISCELLANEOUS) ×1 IMPLANT
SUT FIBERWIRE #2 38 T-5 BLUE (SUTURE) ×2
SUT MNCRL AB 3-0 PS2 18 (SUTURE) IMPLANT
SUT MON AB 2-0 CT1 27 (SUTURE) ×2 IMPLANT
SUT MON AB 2-0 CT1 36 (SUTURE) IMPLANT
SUT VIC AB 1 CT1 27 (SUTURE) ×2
SUT VIC AB 1 CT1 27XBRD ANBCTR (SUTURE) ×2 IMPLANT
SUTURE FIBERWR #2 38 T-5 BLUE (SUTURE) ×1 IMPLANT
TOWEL OR 17X24 6PK STRL BLUE (TOWEL DISPOSABLE) IMPLANT
TOWEL OR 17X26 10 PK STRL BLUE (TOWEL DISPOSABLE) ×2 IMPLANT
WATER STERILE IRR 1000ML POUR (IV SOLUTION) IMPLANT
YANKAUER SUCT BULB TIP NO VENT (SUCTIONS) ×2 IMPLANT

## 2017-06-13 NOTE — Discharge Instructions (Signed)

## 2017-06-13 NOTE — Anesthesia Procedure Notes (Signed)
Anesthesia Regional Block: Interscalene brachial plexus block   Pre-Anesthetic Checklist: ,, timeout performed, Correct Patient, Correct Site, Correct Laterality, Correct Procedure, Correct Position, site marked, Risks and benefits discussed,  Surgical consent,  Pre-op evaluation,  At surgeon's request and post-op pain management  Laterality: Right  Prep: chloraprep       Needles:  Injection technique: Single-shot  Needle Type: Echogenic Stimulator Needle     Needle Length: 9cm  Needle Gauge: 21     Additional Needles:   Procedures:,,,, ultrasound used (permanent image in chart),,,,  Narrative:  Start time: 06/13/2017 7:20 AM End time: 06/13/2017 7:30 AM Injection made incrementally with aspirations every 5 mL.  Performed by: Personally  Anesthesiologist: Murvin Natal, MD  Additional Notes: Functioning IV was confirmed and monitors were applied.  A 47mm 21ga Arrow echogenic stimulator needle was used. Sterile prep, hand hygiene and sterile gloves were used.  Negative aspiration and negative test dose prior to incremental administration of local anesthetic. The patient tolerated the procedure well.  Ultrasound guidance: relevent anatomy identified, needle position confirmed, local anesthetic spread visualized around nerve(s), vascular puncture avoided.  Image printed for medical record.

## 2017-06-13 NOTE — Op Note (Signed)
06/13/2017  10:07 AM  PATIENT:   [name]  72 y.o. female  PRE-OPERATIVE DIAGNOSIS:  Failed right total shoulder arthroplasty  POST-OPERATIVE DIAGNOSIS:  Same with finding of complete rotator cuff avulsion  PROCEDURE:  Removal of TSA and placement of RSA #6 stem, +6 poly, 36/+4 glenosphere on a  MGS baseplate  SURGEON:  Horace Lukas, Metta Clines M.D.  ASSISTANTS: Shuford pac   ANESTHESIA:   GET + ISB  EBL: 200  SPECIMEN:  none  Drains: none   PATIENT DISPOSITION:  PACU - hemodynamically stable.    PLAN OF CARE: Admit for overnight observation  Dictation# [id number]   Contact # [phone]

## 2017-06-13 NOTE — Transfer of Care (Signed)
Immediate Anesthesia Transfer of Care Note  Patient: [name]  Procedure(s) Performed: Conversion of right total shoulder arthroplasty to reverse shoulder arthroplasty (Right Shoulder)  Patient Location: PACU  Anesthesia Type:GA combined with regional for post-op pain  Level of Consciousness: awake, alert  and oriented  Airway & Oxygen Therapy: Patient Spontanous Breathing and Patient connected to nasal cannula oxygen  Post-op Assessment: Report given to RN and Post -op Vital signs reviewed and stable  Post vital signs: Reviewed and stable  Last Vitals:  Vitals:   06/13/17 0550 06/13/17 1005  BP: 134/76 (!) (P) 150/81  Pulse: 65   Resp: 18   Temp: 36.6 C (P) 36.5 C  SpO2: 96%     Last Pain:  Vitals:   06/13/17 0550  TempSrc: Oral      Patients Stated Pain Goal: 3 (18/98/42 1031)  Complications: No apparent anesthesia complications

## 2017-06-13 NOTE — H&P (Signed)
Leroy Libman    Chief Complaint: Failed right total shoulder arthroplasty HPI: The patient is a 72 y.o. female s/p R TSA who fell post op sustaining complete rupture of rotator cuff musculature and now has instability and pain. Planned for conversion to RSA  Past Medical History:  Diagnosis Date  . Arthritis   . Cataracts, bilateral    immature  . Chronic back pain    spondylolisthesis  . Complication of anesthesia    unable to get up due dizziness,nausea- stayed overnite  . Constipation    takes Colace daily  . Depression    but not on any meds  . History of colon polyps    benign one time and precancerous another  . History of kidney stones 12/2015  . Hyperlipidemia    takes Pravastatin daily  . Hypertension    takes Losartan daily  . Hypothyroidism    takes Synthroid daily  . Numbness    tingling/right lower leg   . PONV (postoperative nausea and vomiting)   . Shoulder arthritis    Right    Past Surgical History:  Procedure Laterality Date  . ABDOMINAL HYSTERECTOMY    . BACK SURGERY    . COLONOSCOPY    . MAXIMUM ACCESS (MAS)POSTERIOR LUMBAR INTERBODY FUSION (PLIF) 1 LEVEL N/A 07/19/2016   Procedure: LUMBAR FOUR-FIVE MAXIMUM ACCESS (MAS) POSTERIOR LUMBAR INTERBODY FUSION (PLIF), INSTRUMENTED LUMBAR THREE-FIVE;  Surgeon: Eustace Moore, MD;  Location: [facility];  Service: Neurosurgery;  Laterality: N/A;  . SHOULDER SURGERY Left 2004   arthroscopy  . TOTAL SHOULDER ARTHROPLASTY Left 01/19/2016   Procedure: TOTAL SHOULDER ARTHROPLASTY;  Surgeon: Justice Britain, MD;  Location: [facility];  Service: Orthopedics;  Laterality: Left;  . TOTAL SHOULDER ARTHROPLASTY Right 04/18/2017   Procedure: RIGHT TOTAL SHOULDER ARTHROPLASTY;  Surgeon: Justice Britain, MD;  Location: [facility];  Service: Orthopedics;  Laterality: Right;    Family History  Problem Relation Age of Onset  . Lung cancer Mother   . Lung cancer Father     Social History:  reports that  has never smoked. she has never used  smokeless tobacco. She reports that she drinks alcohol. She reports that she does not use drugs.   Medications Prior to Admission  Medication Sig Dispense Refill  . acetaminophen (TYLENOL) 650 MG CR tablet Take 650-1,300 mg by mouth 3 (three) times daily as needed for pain.     Marland Kitchen aspirin EC 81 MG tablet Take 81 mg by mouth every other day.    . Calcium-Vitamin D-Vitamin K (VIACTIV PO) Take 1 tablet by mouth daily.     . diclofenac sodium (VOLTAREN) 1 % GEL Apply 1 application topically 3 (three) times daily as needed (joint pain).    Marland Kitchen diphenhydrAMINE (BENADRYL) 2 % cream Apply 1 application topically 3 (three) times daily as needed for itching.    . estradiol (ESTRACE) 0.1 MG/GM vaginal cream Place 1 Applicatorful vaginally 2 (two) times a week.     . L-LYSINE PO Take 0.5 tablets by mouth daily.     Marland Kitchen levothyroxine (SYNTHROID, LEVOTHROID) 50 MCG tablet Take 50 mcg by mouth daily before breakfast.     . loratadine (CLARITIN) 10 MG tablet Take 10 mg by mouth daily as needed for allergies.    Marland Kitchen losartan (COZAAR) 25 MG tablet Take 25 mg by mouth once daily  0  . Omega-3 Fatty Acids (FISH OIL) 1200 MG CAPS Take 1,200 mg by mouth every evening.    . polycarbophil (FIBERCON)  625 MG tablet Take 625 mg by mouth daily.    . pravastatin (PRAVACHOL) 40 MG tablet Take 40 mg by mouth at bedtime.     . traMADol (ULTRAM) 50 MG tablet Take by mouth every 6 (six) hours as needed (Pt has been taking it at night).    . triamcinolone ointment (KENALOG) 0.1 % Apply 1 application topically 3 (three) times daily as needed (foot itch).    . diazepam (VALIUM) 5 MG tablet Take 0.5-1 tablets (2.5-5 mg total) by mouth every 6 (six) hours as needed for muscle spasms or sedation. (Patient not taking: Reported on 06/04/2017) 40 tablet 1  . naproxen (NAPROSYN) 500 MG tablet Take 1 tablet (500 mg total) by mouth 2 (two) times daily with a meal. 60 tablet 1  . ondansetron (ZOFRAN) 4 MG tablet Take 1 tablet (4 mg total) by mouth  every 8 (eight) hours as needed for nausea or vomiting. (Patient not taking: Reported on 06/04/2017) 20 tablet 0  . oxyCODONE-acetaminophen (PERCOCET) 5-325 MG tablet Take 1 tablet by mouth every 4 (four) hours as needed. (Patient not taking: Reported on 06/04/2017) 40 tablet 0     Physical Exam: R shoulder with profound loss of motion with pain and swelling and instability  Vitals  Temp:  [97.8 F (36.6 C)] 97.8 F (36.6 C) (02/21 0550) Pulse Rate:  [65] 65 (02/21 0550) Resp:  [18] 18 (02/21 0550) BP: (134)/(76) 134/76 (02/21 0550) SpO2:  [96 %] 96 % (02/21 0550) Weight:  [81.6 kg (180 lb)] 81.6 kg (180 lb) (02/21 0550)  Assessment/Plan  Impression: Failed right total shoulder arthroplasty  Plan of Action: Procedure(s): Conversion of right total shoulder arthroplasty to reverse shoulder arthroplasty  Kegan Mckeithan M Loanne Emery 06/13/2017, 7:23 AM Contact # [phone]

## 2017-06-13 NOTE — Anesthesia Procedure Notes (Signed)
Procedure Name: Intubation Date/Time: 06/13/2017 7:47 AM Performed by: Mariea Clonts, CRNA Pre-anesthesia Checklist: Patient identified, Emergency Drugs available, Suction available and Patient being monitored Patient Re-evaluated:Patient Re-evaluated prior to induction Oxygen Delivery Method: Circle System Utilized Preoxygenation: Pre-oxygenation with 100% oxygen Induction Type: IV induction Ventilation: Mask ventilation without difficulty Laryngoscope Size: Mac and 3 Grade View: Grade I Tube type: Oral Tube size: 7.0 mm Number of attempts: 1 Airway Equipment and Method: Stylet and Oral airway Placement Confirmation: ETT inserted through vocal cords under direct vision,  positive ETCO2 and breath sounds checked- equal and bilateral Secured at: 22 cm Tube secured with: Tape Dental Injury: Teeth and Oropharynx as per pre-operative assessment

## 2017-06-13 NOTE — Anesthesia Postprocedure Evaluation (Signed)
Anesthesia Post Note  Patient: [name]  Procedure(s) Performed: Conversion of right total shoulder arthroplasty to reverse shoulder arthroplasty (Right Shoulder)     Patient location during evaluation: PACU Anesthesia Type: Regional and General Level of consciousness: awake and alert Pain management: pain level controlled Vital Signs Assessment: post-procedure vital signs reviewed and stable Respiratory status: spontaneous breathing, nonlabored ventilation, respiratory function stable and patient connected to nasal cannula oxygen Cardiovascular status: blood pressure returned to baseline and stable Postop Assessment: no apparent nausea or vomiting Anesthetic complications: no    Last Vitals:  Vitals:   06/13/17 1045 06/13/17 1100  BP: (!) 135/98 (!) 145/80  Pulse: 68 68  Resp: 12 20  Temp:    SpO2: 96% 93%    Last Pain:  Vitals:   06/13/17 1100  TempSrc:   PainSc: 3                  Colum Colt P Asaiah Scarber

## 2017-06-14 MED ORDER — ONDANSETRON HCL 4 MG PO TABS: 4.0000 mg | ORAL_TABLET | Freq: Three times a day (TID) | ORAL | 0 refills | Status: DC | PRN

## 2017-06-14 MED ORDER — OXYCODONE-ACETAMINOPHEN 5-325 MG PO TABS: 1.0000 | ORAL_TABLET | ORAL | 0 refills | Status: DC | PRN

## 2017-06-14 NOTE — Evaluation (Signed)
Occupational Therapy Evaluation Patient Details Name: [name] MRN: [id number] DOB: [birth year] Today's Date: 06/14/2017    History of Present Illness 72 yo female s/p Reverse R TSA  Past Medical History:  Diagnosis Date  . Arthritis   . Cataracts, bilateral    immature  . Chronic back pain    spondylolisthesis  . Complication of anesthesia    unable to get up due dizziness,nausea- stayed overnite  . Constipation    takes Colace daily  . Depression    but not on any meds  . History of colon polyps    benign one time and precancerous another  . History of kidney stones 12/2015  . Hyperlipidemia    takes Pravastatin daily  . Hypertension    takes Losartan daily  . Hypothyroidism    takes Synthroid daily  . Numbness    tingling/right lower leg   . PONV (postoperative nausea and vomiting)   . Shoulder arthritis    Right      Clinical Impression   Patient evaluated by Occupational Therapy with no further acute OT needs identified. All education has been completed and the patient has no further questions. See below for any follow-up Occupational Therapy or equipment needs. OT to sign off. Thank you for referral.      Follow Up Recommendations  No OT follow up    Equipment Recommendations       Recommendations for Other Services       Precautions / Restrictions Precautions Precautions: Shoulder Type of Shoulder Precautions: active Shoulder Interventions: Off for dressing/bathing/exercises Precaution Comments: shoulder protocol handout provided and reviewed in detail Required Braces or Orthoses: Sling Restrictions Weight Bearing Restrictions: Yes RUE Weight Bearing: Non weight bearing      Mobility Bed Mobility Overal bed mobility: Modified Independent                Transfers Overall transfer level: Modified independent                    Balance Overall balance assessment: Needs assistance Sitting-balance support: No upper  extremity supported;Feet supported Sitting balance-Leahy Scale: Good     Standing balance support: Single extremity supported;During functional activity Standing balance-Leahy Scale: Fair Standing balance comment: pt advised to avoid R UE holding and support on counter. pt is very R Ue dominant                           ADL either performed or assessed with clinical judgement   ADL Overall ADL's : Modified independent                                       General ADL Comments: pt educated on shoulder limitations during adls and able to complete adl and washing at sink level during session. pt is completely dressed for home and able to don doff sling. pt even buttoned her own shirt. pt familiar with exercises and able to direct exercises by handout provided. pt complete during session to ensure teach back  Educated never to wash directly on incision site, always use fresh clean linen (one time use then place in laundry), avoid water under bandage    Vision Baseline Vision/History: Wears glasses Wears Glasses: At all times Patient Visual Report: No change from baseline       Perception     Praxis  Pertinent Vitals/Pain Pain Assessment: 0-10 Pain Score: 5  Pain Location: R shoulder Pain Descriptors / Indicators: Operative site guarding;Sore;Aching Pain Intervention(s): Monitored during session;Premedicated before session;Repositioned;Ice applied     Hand Dominance Right   Extremity/Trunk Assessment Upper Extremity Assessment Upper Extremity Assessment: RUE deficits/detail RUE Deficits / Details: s/p surg   Lower Extremity Assessment Lower Extremity Assessment: Overall WFL for tasks assessed   Cervical / Trunk Assessment Cervical / Trunk Assessment: Normal   Communication Communication Communication: No difficulties   Cognition Arousal/Alertness: Awake/alert Behavior During Therapy: WFL for tasks assessed/performed Overall Cognitive  Status: Within Functional Limits for tasks assessed                                     General Comments  dressing dry and intact    Exercises Exercises: Shoulder Shoulder Exercises Pendulum Exercise: AROM;Right;10 reps;Standing Elbow Flexion: AROM;Standing;Right Wrist Flexion: AROM;Standing;Right Digit Composite Flexion: AROM;Right;Standing Neck Flexion: AROM;Seated;10 reps   Shoulder Instructions Shoulder Instructions Donning/doffing shirt without moving shoulder: Modified independent Method for sponge bathing under operated UE: Modified independent Donning/doffing sling/immobilizer: Modified independent Correct positioning of sling/immobilizer: Modified independent Pendulum exercises (written home exercise program): Modified independent ROM for elbow, wrist and digits of operated UE: Modified independent Sling wearing schedule (on at all times/off for ADL's): Modified independent Proper positioning of operated UE when showering: Modified independent Positioning of UE while sleeping: Modified independent    Home Living Family/patient expects to be discharged to:: Private residence Living Arrangements: Spouse/significant other Available Help at Discharge: Family;Available 24 hours/day Type of Home: House Home Access: Stairs to enter [employer] of Steps: 6 Entrance Stairs-Rails: Left;Right Home Layout: One level     Bathroom Shower/Tub: Teacher, early years/pre: Handicapped height Bathroom Accessibility: Yes   Home Equipment: Cane - single point;Bedside commode;Shower seat;Grab bars - tub/shower          Prior Functioning/Environment Level of Independence: Independent                 OT Problem List:        OT Treatment/Interventions:      OT Goals(Current goals can be found in the care plan section) Acute Rehab OT Goals Patient Stated Goal: no  OT Frequency:     Barriers to D/C:            Co-evaluation               AM-PAC PT "6 Clicks" Daily Activity     Outcome Measure Help from another person eating meals?: None Help from another person taking care of personal grooming?: None Help from another person toileting, which includes using toliet, bedpan, or urinal?: None Help from another person bathing (including washing, rinsing, drying)?: None Help from another person to put on and taking off regular upper body clothing?: None Help from another person to put on and taking off regular lower body clothing?: None 6 Click Score: 24   End of Session Nurse Communication: Mobility status;Precautions;Weight bearing status  Activity Tolerance: Patient tolerated treatment well Patient left: in chair;with call bell/phone within reach;with family/visitor present  OT Visit Diagnosis: Pain Pain - Right/Left: Right Pain - part of body: Shoulder                Time: 6295-2841 OT Time Calculation (min): 34 min Charges:  OT General Charges $OT Visit: 1 Visit OT Evaluation $OT Eval Moderate Complexity: 1 Mod  OT Treatments $Self Care/Home Management : 8-22 mins G-Codes:      Jeri Modena   OTR/L Pager: [phone] Office: [phone] .   Parke Poisson B 06/14/2017, 9:11 AM

## 2017-06-14 NOTE — Plan of Care (Signed)
Call bell in reach, patient calls for assistance when needed.

## 2017-06-14 NOTE — Op Note (Signed)
NAME:  NAMINE, BEAHM                     ACCOUNT NO.:  MEDICAL RECORD NO.:  [id number]  LOCATION:                                 FACILITY:  PHYSICIAN:  Metta Clines. Xaden Kaufman, M.D.       DATE OF BIRTH:  DATE OF PROCEDURE:  06/13/2017 DATE OF DISCHARGE:                              OPERATIVE REPORT   PREOPERATIVE DIAGNOSIS:  Failed right total shoulder arthroplasty.  POSTOPERATIVE DIAGNOSIS:  Failed right total shoulder arthroplasty with finding of a complete avulsion of the entirety of the rotator cuff anteriorly and superiorly.  PROCEDURE:  Conversion of a total shoulder to a reverse shoulder arthroplasty.  SURGEON:  Metta Clines. Meili Kleckley, M.D.  Terrence DupontOlivia Mackie A. Shuford, P.A.-C.  ANESTHESIA:  General endotracheal as well as interscalene block using Exparel.  ESTIMATED BLOOD LOSS:  200 mL.  DRAINS:  None.  HISTORY:  Ms. Bousquet is a 72 year old female, who underwent a primary right total shoulder arthroplasty in late December 2018 approximately 2 months ago and unfortunately the first week postop, fell at home landing directly on the right shoulder with immediate complaints of severe pain. Her initial radiographs at that time, immediately after the fall showed overall good alignment and position of her implant.  However, she has continued to have difficulties with pain and limited mobility, increasing functional limitations.  More recent x-rays show that she has anterior superior escape of the humeral head consistent with complete rupture of the rotator cuff and she has profound weakness, limited motion.  She is brought to the operating room at this time, for planned conversion to a reverse shoulder arthroplasty.  Preoperatively, I counseled Ms. Mergen regarding treatment options and potential risks versus benefits thereof.  Possible surgical complications were reviewed including bleeding, infection, neurovascular injury, persistent pain, loss of motion, anesthetic  complication, failure of the implant, and possible need for additional surgery.  She understands and accepts and agrees with the planned procedure.  PROCEDURE IN DETAIL:  After undergoing routine preop evaluation, the patient received prophylactic antibiotics.  Interscalene block was performed by the Anesthesia Department using Exparel.  Brought to the operating room, placed supine on the operating table.  Underwent smooth induction of a general endotracheal anesthesia.  Placed in beach-chair position, appropriately padded and protected.  Right shoulder girdle region was then sterilely prepped and draped in standard fashion.  Time- out was called.  Anterior approach to the right shoulder made through a previous deltopectoral incision.  Dissection carried deeply.  The cephalic vein had become incarcerated into scar tissue with very difficult dissection, ultimately vein was compromised, we went ahead and ligate the vein to enhance exposure.  Deltopectoral interval was then redeveloped with significant scarring noted deeply.  We carefully identified and mobilized the conjoint tendon, which were retracted medially.  Adhesions divided from beneath the deltoid and humeral head was then delivered through the wound.  This allowed [place] to confirm that there had been complete avulsion of the rotator cuff superiorly and anteriorly.  At this point, we then removed the humeral head.  We carefully removed soft tissue from the junction between the implant and the medullary canal.  We  removed the proximal aspect of the humeral stem allowing [place] to take an osteotome carefully divide around the implant loosening and applied a slap hammer.  We were able to remove the humeral stem.  We were able to preserve all the proximal bone.  At this point, we then performed some additional capsular and soft tissue releases to enhance our exposure of the glenoid.  A trial stem with metal cap placed on the humeral shaft  and we went ahead and exposed the glenoid with combination of Fukuda, pitchfork, and snake tongue retractors.  We gained circumferential exposure of the glenoid, which we then removed using an osteotome sequentially around the margins and this was removed although the central and superior pegs were left behind in the glenoid vault and we spent some time trying to remove the retained glenoid pegs and it became clear that this would significantly compromise the glenoid vault bone and so we went ahead and retained the metal peg superiorly and centrally.  I went ahead and placed a central guide pin with a 10- degree inferior angulation, reamed with the appropriate glenoid reamer both centrally and peripherally and then placed our central drill hole and then tapped this.  We identified a 35 mm length and went ahead and assembled our glenoid baseplate with a 35 mm central lag screw and this was then placed into the glenoid with excellent fit and fixation and very solid stable construct.  I went ahead and placed the 4 peripheral screws, 3 of which were locking and 1 was nonlocking, all of which obtained excellent bony purchase and fixation.  At this time, we then confirmed stability.  We went ahead and impacted a 36 +4 glenosphere, which was then appropriately tightened.  At this point, we then returned our attention back to the humeral canal and we went ahead and used the broaches up to size 6, which had excellent fit and fixation.  Performed our metaphyseal reaming and a trial implant showed good soft tissue balance and good stability.  At this point, we assembled the final size 6 stem with a neutral metaphysis and this was then impacted into position again with very solid fixation much to our satisfaction. Series of trials reduction were then performed.  The +6 polyethylene gave [place] the best soft tissue balance and good stability.  The final polyethylene was then impacted into position.  The  final reduction was performed.  We were very pleased with the overall soft tissue balance and stability of the shoulder with good motion.  Wound was then copiously irrigated.  At this point, the deltopectoral interval was then reapproximated with figure-of-eight #1 Vicryl sutures.  2-0 Monocryl used for the subcu layer, intracuticular 3-0 Monocryl for the skin, followed by Dermabond and Aquacel dressing.  Right arm was then placed into a sling.  The patient was awakened, extubated, and taken to the recovery room in stable condition.  Jenetta Loges, P.A.-C, was used as an Environmental consultant throughout this case, essential for help with positioning the patient, positioning the extremity, tissue manipulation, suture management, wound closure, implantation of prosthesis, and intraoperative decision making.     Metta Clines. Denorris Reust, M.D.     KMS/MEDQ  D:  06/13/2017  T:  06/13/2017  Job:  [id number]

## 2017-06-14 NOTE — Progress Notes (Signed)
Discharge instructions (including medications) discussed with and copy provided to patient/caregiver along with paper prescriptions.

## 2017-06-17 ENCOUNTER — Encounter (HOSPITAL_COMMUNITY): Payer: Self-pay | Admitting: Orthopedic Surgery

## 2017-06-19 DIAGNOSIS — Z1211 Encounter for screening for malignant neoplasm of colon: Secondary | ICD-10-CM | POA: Diagnosis not present

## 2017-06-19 DIAGNOSIS — R197 Diarrhea, unspecified: Secondary | ICD-10-CM | POA: Diagnosis not present

## 2017-06-21 DIAGNOSIS — R197 Diarrhea, unspecified: Secondary | ICD-10-CM | POA: Diagnosis not present

## 2017-06-24 DIAGNOSIS — Z96611 Presence of right artificial shoulder joint: Secondary | ICD-10-CM | POA: Diagnosis not present

## 2017-06-24 DIAGNOSIS — Z96612 Presence of left artificial shoulder joint: Secondary | ICD-10-CM | POA: Diagnosis not present

## 2017-06-24 DIAGNOSIS — Z471 Aftercare following joint replacement surgery: Secondary | ICD-10-CM | POA: Diagnosis not present

## 2017-06-25 NOTE — Discharge Summary (Signed)
PATIENT ID:      [name]  MRN:     [id number] DOB/AGE:    [birth year] / 72 y.o.     DISCHARGE SUMMARY  ADMISSION DATE:    06/13/2017 DISCHARGE DATE:  06/14/2017  ADMISSION DIAGNOSIS: Failed right total shoulder arthroplasty Past Medical History:  Diagnosis Date  . Arthritis   . Cataracts, bilateral    immature  . Chronic back pain    spondylolisthesis  . Complication of anesthesia    unable to get up due dizziness,nausea- stayed overnite  . Constipation    takes Colace daily  . Depression    but not on any meds  . History of colon polyps    benign one time and precancerous another  . History of kidney stones 12/2015  . Hyperlipidemia    takes Pravastatin daily  . Hypertension    takes Losartan daily  . Hypothyroidism    takes Synthroid daily  . Numbness    tingling/right lower leg   . PONV (postoperative nausea and vomiting)   . Shoulder arthritis    Right    DISCHARGE DIAGNOSIS:   Active Problems:   S/p reverse total shoulder arthroplasty   PROCEDURE: Procedure(s): Conversion of right total shoulder arthroplasty to reverse shoulder arthroplasty on 06/13/2017  CONSULTS:    HISTORY:  See H&P in chart.  HOSPITAL COURSE:  [name] is a 72 y.o. admitted on 06/13/2017 with a diagnosis of Failed right total shoulder arthroplasty.  They were brought to the operating room on 06/13/2017 and underwent Procedure(s): Conversion of right total shoulder arthroplasty to reverse shoulder arthroplasty.    They were given perioperative antibiotics:  Anti-infectives (From admission, onward)   Start     Dose/Rate Route Frequency Ordered Stop   06/13/17 1300  ceFAZolin (ANCEF) IVPB 2g/100 mL premix     2 g 200 mL/hr over 30 Minutes Intravenous Every 6 hours 06/13/17 1117 06/14/17 0659   06/13/17 0543  ceFAZolin (ANCEF) IVPB 2g/100 mL premix     2 g 200 mL/hr over 30 Minutes Intravenous On call to O.R. 06/13/17 3235 06/13/17 5732    .  Patient underwent the above named  procedure and tolerated it well. The following day they were hemodynamically stable and pain was controlled on oral analgesics. They were neurovascularly intact to the operative extremity. OT was ordered and worked with patient per protocol. They were medically and orthopaedically stable for discharge on 06/14/2017.    DIAGNOSTIC STUDIES:  RECENT RADIOGRAPHIC STUDIES :  No results found.  RECENT VITAL SIGNS:  No data found.Marland Kitchen  RECENT EKG RESULTS:    Orders placed or performed during the hospital encounter of 04/12/17  . EKG 12 lead  . EKG 12 lead    DISCHARGE INSTRUCTIONS:    DISCHARGE MEDICATIONS:   Allergies as of 06/14/2017   No Known Allergies     Medication List    STOP taking these medications   diazepam 5 MG tablet Commonly known as:  VALIUM     TAKE these medications   acetaminophen 650 MG CR tablet Commonly known as:  TYLENOL Take 650-1,300 mg by mouth 3 (three) times daily as needed for pain.   aspirin EC 81 MG tablet Take 81 mg by mouth every other day. Notes to patient:  Every other day   diclofenac sodium 1 % Gel Commonly known as:  VOLTAREN Apply 1 application topically 3 (three) times daily as needed (joint pain).   diphenhydrAMINE 2 % cream Commonly known  as:  BENADRYL Apply 1 application topically 3 (three) times daily as needed for itching.   estradiol 0.1 MG/GM vaginal cream Commonly known as:  ESTRACE Place 1 Applicatorful vaginally 2 (two) times a week.   Fish Oil 1200 MG Caps Take 1,200 mg by mouth every evening.   L-LYSINE PO Take 0.5 tablets by mouth daily.   levothyroxine 50 MCG tablet Commonly known as:  SYNTHROID, LEVOTHROID Take 50 mcg by mouth daily before breakfast. Notes to patient:  Take before breakfast   loratadine 10 MG tablet Commonly known as:  CLARITIN Take 10 mg by mouth daily as needed for allergies.   losartan 25 MG tablet Commonly known as:  COZAAR Take 25 mg by mouth once daily   naproxen 500 MG  tablet Commonly known as:  NAPROSYN Take 1 tablet (500 mg total) by mouth 2 (two) times daily with a meal.   ondansetron 4 MG tablet Commonly known as:  ZOFRAN Take 1 tablet (4 mg total) by mouth every 8 (eight) hours as needed for nausea or vomiting. What changed:  Another medication with the same name was added. Make sure you understand how and when to take each.   ondansetron 4 MG tablet Commonly known as:  ZOFRAN Take 1 tablet (4 mg total) by mouth every 8 (eight) hours as needed for nausea or vomiting. What changed:  You were already taking a medication with the same name, and this prescription was added. Make sure you understand how and when to take each.   oxyCODONE-acetaminophen 5-325 MG tablet Commonly known as:  PERCOCET Take 1 tablet by mouth every 4 (four) hours as needed.   polycarbophil 625 MG tablet Commonly known as:  FIBERCON Take 625 mg by mouth daily.   pravastatin 40 MG tablet Commonly known as:  PRAVACHOL Take 40 mg by mouth at bedtime.   traMADol 50 MG tablet Commonly known as:  ULTRAM Take by mouth every 6 (six) hours as needed (Pt has been taking it at night).   triamcinolone ointment 0.1 % Commonly known as:  KENALOG Apply 1 application topically 3 (three) times daily as needed (foot itch).   VIACTIV PO Take 1 tablet by mouth daily.       FOLLOW UP VISIT:   Follow-up Information    Justice Britain, MD.   Specialty:  Orthopedic Surgery Why:  call to be seen in 10-14 days Contact information: [address] [place] [address] [phone]           DISCHARGE FM:BWGY   DISCHARGE CONDITION:  Festus Barren for Dr. Justice Britain 06/25/2017, 2:05 PM

## 2017-06-27 DIAGNOSIS — M25511 Pain in right shoulder: Secondary | ICD-10-CM | POA: Diagnosis not present

## 2017-06-27 DIAGNOSIS — Z96611 Presence of right artificial shoulder joint: Secondary | ICD-10-CM | POA: Diagnosis not present

## 2017-07-01 DIAGNOSIS — Z96611 Presence of right artificial shoulder joint: Secondary | ICD-10-CM | POA: Diagnosis not present

## 2017-07-01 DIAGNOSIS — M25511 Pain in right shoulder: Secondary | ICD-10-CM | POA: Diagnosis not present

## 2017-07-02 DIAGNOSIS — M25511 Pain in right shoulder: Secondary | ICD-10-CM | POA: Diagnosis not present

## 2017-07-02 DIAGNOSIS — Z96611 Presence of right artificial shoulder joint: Secondary | ICD-10-CM | POA: Diagnosis not present

## 2017-07-04 DIAGNOSIS — M25511 Pain in right shoulder: Secondary | ICD-10-CM | POA: Diagnosis not present

## 2017-07-04 DIAGNOSIS — Z96611 Presence of right artificial shoulder joint: Secondary | ICD-10-CM | POA: Diagnosis not present

## 2017-07-08 DIAGNOSIS — M25511 Pain in right shoulder: Secondary | ICD-10-CM | POA: Diagnosis not present

## 2017-07-08 DIAGNOSIS — Z96611 Presence of right artificial shoulder joint: Secondary | ICD-10-CM | POA: Diagnosis not present

## 2017-07-11 DIAGNOSIS — M25511 Pain in right shoulder: Secondary | ICD-10-CM | POA: Diagnosis not present

## 2017-07-11 DIAGNOSIS — Z96611 Presence of right artificial shoulder joint: Secondary | ICD-10-CM | POA: Diagnosis not present

## 2017-07-15 DIAGNOSIS — Z96611 Presence of right artificial shoulder joint: Secondary | ICD-10-CM | POA: Diagnosis not present

## 2017-07-15 DIAGNOSIS — M25511 Pain in right shoulder: Secondary | ICD-10-CM | POA: Diagnosis not present

## 2017-07-19 DIAGNOSIS — Z96611 Presence of right artificial shoulder joint: Secondary | ICD-10-CM | POA: Diagnosis not present

## 2017-07-19 DIAGNOSIS — M25511 Pain in right shoulder: Secondary | ICD-10-CM | POA: Diagnosis not present

## 2017-07-23 DIAGNOSIS — M25511 Pain in right shoulder: Secondary | ICD-10-CM | POA: Diagnosis not present

## 2017-07-23 DIAGNOSIS — Z96611 Presence of right artificial shoulder joint: Secondary | ICD-10-CM | POA: Diagnosis not present

## 2017-07-25 DIAGNOSIS — M25511 Pain in right shoulder: Secondary | ICD-10-CM | POA: Diagnosis not present

## 2017-07-25 DIAGNOSIS — Z96611 Presence of right artificial shoulder joint: Secondary | ICD-10-CM | POA: Diagnosis not present

## 2017-07-29 DIAGNOSIS — Z96611 Presence of right artificial shoulder joint: Secondary | ICD-10-CM | POA: Diagnosis not present

## 2017-07-29 DIAGNOSIS — M25511 Pain in right shoulder: Secondary | ICD-10-CM | POA: Diagnosis not present

## 2017-08-02 DIAGNOSIS — Z96611 Presence of right artificial shoulder joint: Secondary | ICD-10-CM | POA: Diagnosis not present

## 2017-08-02 DIAGNOSIS — M25511 Pain in right shoulder: Secondary | ICD-10-CM | POA: Diagnosis not present

## 2017-08-06 DIAGNOSIS — M25511 Pain in right shoulder: Secondary | ICD-10-CM | POA: Diagnosis not present

## 2017-08-06 DIAGNOSIS — Z96611 Presence of right artificial shoulder joint: Secondary | ICD-10-CM | POA: Diagnosis not present

## 2017-08-12 DIAGNOSIS — M25511 Pain in right shoulder: Secondary | ICD-10-CM | POA: Diagnosis not present

## 2017-08-12 DIAGNOSIS — Z96611 Presence of right artificial shoulder joint: Secondary | ICD-10-CM | POA: Diagnosis not present

## 2017-08-21 DIAGNOSIS — M25511 Pain in right shoulder: Secondary | ICD-10-CM | POA: Diagnosis not present

## 2017-08-21 DIAGNOSIS — Z96611 Presence of right artificial shoulder joint: Secondary | ICD-10-CM | POA: Diagnosis not present

## 2017-08-22 DIAGNOSIS — E785 Hyperlipidemia, unspecified: Secondary | ICD-10-CM | POA: Diagnosis not present

## 2017-08-22 DIAGNOSIS — E039 Hypothyroidism, unspecified: Secondary | ICD-10-CM | POA: Diagnosis not present

## 2017-08-22 DIAGNOSIS — E559 Vitamin D deficiency, unspecified: Secondary | ICD-10-CM | POA: Diagnosis not present

## 2017-08-22 DIAGNOSIS — I10 Essential (primary) hypertension: Secondary | ICD-10-CM | POA: Diagnosis not present

## 2017-08-22 DIAGNOSIS — Z79899 Other long term (current) drug therapy: Secondary | ICD-10-CM | POA: Diagnosis not present

## 2017-08-27 ENCOUNTER — Ambulatory Visit (INDEPENDENT_AMBULATORY_CARE_PROVIDER_SITE_OTHER): Payer: Medicare Other | Admitting: Gastroenterology

## 2017-08-27 ENCOUNTER — Encounter: Payer: Self-pay | Admitting: Gastroenterology

## 2017-08-27 VITALS — BP 122/78 | HR 78 | Ht 64.0 in | Wt 181.1 lb

## 2017-08-27 DIAGNOSIS — Z96611 Presence of right artificial shoulder joint: Secondary | ICD-10-CM | POA: Diagnosis not present

## 2017-08-27 DIAGNOSIS — K581 Irritable bowel syndrome with constipation: Secondary | ICD-10-CM

## 2017-08-27 DIAGNOSIS — M25511 Pain in right shoulder: Secondary | ICD-10-CM | POA: Diagnosis not present

## 2017-08-27 DIAGNOSIS — Z86010 Personal history of colonic polyps: Secondary | ICD-10-CM | POA: Diagnosis not present

## 2017-08-27 MED ORDER — SOD PICOSULFATE-MAG OX-CIT ACD 10-3.5-12 MG-GM -GM/160ML PO SOLN: 1.0000 | Freq: Once | ORAL | 0 refills | Status: AC

## 2017-08-27 NOTE — Patient Instructions (Signed)
If you are age 70 or older, your body mass index should be between 23-30. Your Body mass index is 31.09 kg/m. If this is out of the aforementioned range listed, please consider follow up with your Primary Care Provider.  If you are age 13 or younger, your body mass index should be between 19-25. Your Body mass index is 31.09 kg/m. If this is out of the aformentioned range listed, please consider follow up with your Primary Care Provider.   We have given you samples of the following medication to take: Clenpiq  You have been scheduled for a colonoscopy. Please follow written instructions given to you at your visit today.  Please pick up your prep supplies at the pharmacy within the next 1-3 days. If you use inhalers (even only as needed), please bring them with you on the day of your procedure. Your physician has requested that you go to www.startemmi.com and enter the access code given to you at your visit today. This web site gives a general overview about your procedure. However, you should still follow specific instructions given to you by our office regarding your preparation for the procedure.  Thank you,  Dr. Jackquline Denmark

## 2017-08-27 NOTE — Progress Notes (Signed)
Chief Complaint: For colonosocopy.  Referring Provider:  Melony Overly, MD      ASSESSMENT AND PLAN;   #1. H/O Polyps - had 2011, 2014 (Dr Melina Copa)  #2. H/O IBS with constipation, better with fiber supplements/colace prn.  Proceed with colonoscopy.  I have discussed the risks and benefits.  The risks including risk of perforation requiring laparotomy, bleeding after polypectomy requiring blood transfusions and risks of anesthesia/sedation were discussed.  Rare risks of missing colorectal neoplasms were also discussed.  Alternatives were given.  Patient is fully aware and agrees to proceed. All the questions were answered. Colonoscopy will be scheduled in upcoming days.  Patient is to report immediately if there is any significant weight loss or excessive bleeding until then. Consent forms were given for review.  Husband having major oral Sx for oral cancer in [place].   HPI:   72yrs old No nausea, vomiting, heartburn, regurgitation, odynophagia or dysphagia.  No significant diarrhea or constipation (as long she take fiber).  There is no melena or hematochezia. No unintentional weight loss. Rare RLQ pain since back Sx- resolved.  Past Medical History:  Diagnosis Date  . Arthritis   . Cataracts, bilateral    immature  . Chronic back pain    spondylolisthesis  . Complication of anesthesia    unable to get up due dizziness,nausea- stayed overnite  . Constipation    takes Colace daily  . Depression    but not on any meds  . History of colon polyps    benign one time and precancerous another  . History of colon polyps   . History of kidney stones 12/2015  . Hyperlipidemia    takes Pravastatin daily  . Hypertension    takes Losartan daily  . Hypothyroidism    takes Synthroid daily  . Numbness    tingling/right lower leg   . PONV (postoperative nausea and vomiting)   . Shoulder arthritis    Right    Past Surgical History:  Procedure Laterality Date  . ABDOMINAL  HYSTERECTOMY    . ABDOMINAL HYSTERECTOMY  2012   bladder tack  . BACK SURGERY    . COLONOSCOPY  2014   Polyps  . MAXIMUM ACCESS (MAS)POSTERIOR LUMBAR INTERBODY FUSION (PLIF) 1 LEVEL N/A 07/19/2016   Procedure: LUMBAR FOUR-FIVE MAXIMUM ACCESS (MAS) POSTERIOR LUMBAR INTERBODY FUSION (PLIF), INSTRUMENTED LUMBAR THREE-FIVE;  Surgeon: Eustace Moore, MD;  Location: [facility];  Service: Neurosurgery;  Laterality: N/A;  . REVERSE SHOULDER ARTHROPLASTY Right 06/13/2017   Procedure: Conversion of right total shoulder arthroplasty to reverse shoulder arthroplasty;  Surgeon: Justice Britain, MD;  Location: [facility];  Service: Orthopedics;  Laterality: Right;  . SHOULDER SURGERY Left 2004   arthroscopy  . SHOULDER SURGERY Right 06/13/2017    Conversion of right total shoulder arthroplasty to reverse shoulder arthroplasty (Right Shoulder)  . TOTAL SHOULDER ARTHROPLASTY Left 01/19/2016   Procedure: TOTAL SHOULDER ARTHROPLASTY;  Surgeon: Justice Britain, MD;  Location: [facility];  Service: Orthopedics;  Laterality: Left;  . TOTAL SHOULDER ARTHROPLASTY Right 04/18/2017   Procedure: RIGHT TOTAL SHOULDER ARTHROPLASTY;  Surgeon: Justice Britain, MD;  Location: [facility];  Service: Orthopedics;  Laterality: Right;    Family History  Problem Relation Age of Onset  . Lung cancer Mother   . Lung cancer Father     Social History   Tobacco Use  . Smoking status: Never Smoker  . Smokeless tobacco: Never Used  Substance Use Topics  . Alcohol use: Yes  Alcohol/week: 0.0 oz    Comment: rarely  . Drug use: No    Current Outpatient Medications  Medication Sig Dispense Refill  . acetaminophen (TYLENOL) 650 MG CR tablet Take 650-1,300 mg by mouth 3 (three) times daily as needed for pain.     Marland Kitchen aspirin EC 81 MG tablet Take 81 mg by mouth every other day.    . Calcium-Vitamin D-Vitamin K (VIACTIV PO) Take 1 tablet by mouth daily.     . diclofenac sodium (VOLTAREN) 1 % GEL Apply 1 application topically 3 (three) times daily as  needed (joint pain).    Marland Kitchen estradiol (ESTRACE) 0.1 MG/GM vaginal cream Place 1 Applicatorful vaginally 2 (two) times a week.     . L-LYSINE PO Take 0.5 tablets by mouth daily.     Marland Kitchen levothyroxine (SYNTHROID, LEVOTHROID) 50 MCG tablet Take 50 mcg by mouth daily before breakfast.     . loratadine (CLARITIN) 10 MG tablet Take 10 mg by mouth daily as needed for allergies.    Marland Kitchen losartan (COZAAR) 25 MG tablet Take 25 mg by mouth once daily  0  . Omega-3 Fatty Acids (FISH OIL) 1200 MG CAPS Take 1,200 mg by mouth every evening.    . polycarbophil (FIBERCON) 625 MG tablet Take 625 mg by mouth daily.    . pravastatin (PRAVACHOL) 40 MG tablet Take 40 mg by mouth at bedtime.     . triamcinolone ointment (KENALOG) 0.1 % Apply 1 application topically 3 (three) times daily as needed (foot itch).     No current facility-administered medications for this visit.     No Known Allergies  Review of Systems:  Constitutional: Denies fever, chills, diaphoresis, appetite change and fatigue.  HEENT: Denies photophobia, eye pain, redness, hearing loss, ear pain, congestion, sore throat, rhinorrhea, sneezing, mouth sores, neck pain, neck stiffness and tinnitus.   Respiratory: Denies SOB, DOE, cough, chest tightness,  and wheezing.   Cardiovascular: Denies chest pain, palpitations and leg swelling.  Genitourinary: Denies dysuria, urgency, frequency, hematuria, flank pain and difficulty urinating.  Musculoskeletal: Denies myalgias, back pain, joint swelling, arthralgias and gait problem.  Skin: No rash.  Neurological: Denies dizziness, seizures, syncope, weakness, light-headedness, numbness and headaches.  Hematological: Denies adenopathy. Easy bruising, personal or family bleeding history  Psychiatric/Behavioral: No anxiety or depression     Physical Exam:    BP 122/78   Pulse 78   Ht 5\' 4"  (1.626 m)   Wt 181 lb 2 oz (82.2 kg)   BMI 31.09 kg/m  Filed Weights   08/27/17 1004  Weight: 181 lb 2 oz (82.2 kg)     Constitutional:  Well-developed, in no acute distress. Psychiatric: Normal mood and affect. Behavior is normal. HEENT: Pupils normal.  Conjunctivae are normal. No scleral icterus. Neck supple.  Cardiovascular: Normal rate, regular rhythm. No edema Pulmonary/chest: Effort normal and breath sounds normal. No wheezing, rales or rhonchi. Abdominal: Soft, nondistended. Nontender. Bowel sounds active throughout. There are no masses palpable. No hepatomegaly. Rectal:  defered Neurological: Alert and oriented to person place and time. Skin: Skin is warm and dry. No rashes noted.  Data Reviewed: I have personally reviewed following labs and imaging studies  CBC: CBC Latest Ref Rng & Units 06/13/2017 04/12/2017 07/11/2016  WBC 4.0 - 10.5 K/uL 7.3 9.2 8.7  Hemoglobin 12.0 - 15.0 g/dL 13.0 14.5 14.1  Hematocrit 36.0 - 46.0 % 40.1 43.7 43.2  Platelets 150 - 400 K/uL 293 294 352    CMP: CMP Latest Ref Rng &  Units 06/13/2017 04/12/2017 07/11/2016  Glucose 65 - 99 mg/dL 86 91 101(H)  BUN 6 - 20 mg/dL 22(H) 16 15  Creatinine 0.44 - 1.00 mg/dL 0.84 0.72 0.75  Sodium 135 - 145 mmol/L 139 137 142  Potassium 3.5 - 5.1 mmol/L 3.9 4.3 3.9  Chloride 101 - 111 mmol/L 106 106 105  CO2 22 - 32 mmol/L 21(L) 24 25  Calcium 8.9 - 10.3 mg/dL 9.3 9.4 9.8     Carmell Austria, MD   Cc: Melony Overly, MD

## 2017-08-30 DIAGNOSIS — Z96611 Presence of right artificial shoulder joint: Secondary | ICD-10-CM | POA: Diagnosis not present

## 2017-08-30 DIAGNOSIS — M25511 Pain in right shoulder: Secondary | ICD-10-CM | POA: Diagnosis not present

## 2017-09-02 DIAGNOSIS — Z96611 Presence of right artificial shoulder joint: Secondary | ICD-10-CM | POA: Diagnosis not present

## 2017-09-02 DIAGNOSIS — M25511 Pain in right shoulder: Secondary | ICD-10-CM | POA: Diagnosis not present

## 2017-09-04 DIAGNOSIS — Z96611 Presence of right artificial shoulder joint: Secondary | ICD-10-CM | POA: Diagnosis not present

## 2017-09-04 DIAGNOSIS — Z471 Aftercare following joint replacement surgery: Secondary | ICD-10-CM | POA: Diagnosis not present

## 2017-10-09 ENCOUNTER — Encounter: Payer: Medicare Other | Admitting: Gastroenterology

## 2017-10-14 ENCOUNTER — Ambulatory Visit (AMBULATORY_SURGERY_CENTER): Payer: Medicare Other | Admitting: Gastroenterology

## 2017-10-14 ENCOUNTER — Encounter: Payer: Self-pay | Admitting: Gastroenterology

## 2017-10-14 ENCOUNTER — Other Ambulatory Visit: Payer: Self-pay

## 2017-10-14 VITALS — BP 120/80 | HR 64 | Temp 98.0°F | Resp 13 | Ht 64.0 in | Wt 181.0 lb

## 2017-10-14 DIAGNOSIS — K589 Irritable bowel syndrome without diarrhea: Secondary | ICD-10-CM | POA: Diagnosis not present

## 2017-10-14 DIAGNOSIS — I10 Essential (primary) hypertension: Secondary | ICD-10-CM | POA: Diagnosis not present

## 2017-10-14 DIAGNOSIS — Z86010 Personal history of colonic polyps: Secondary | ICD-10-CM | POA: Diagnosis not present

## 2017-10-14 MED ORDER — SODIUM CHLORIDE 0.9 % IV SOLN: 500.0000 mL | Freq: Once | INTRAVENOUS | Status: DC

## 2017-10-14 NOTE — Patient Instructions (Signed)
YOU HAD AN ENDOSCOPIC PROCEDURE TODAY AT THE [facility]:   Refer to the procedure report that was given to you for any specific questions about what was found during the examination.  If the procedure report does not answer your questions, please call your gastroenterologist to clarify.  If you requested that your care partner not be given the details of your procedure findings, then the procedure report has been included in a sealed envelope for you to review at your convenience later.  YOU SHOULD EXPECT: Some feelings of bloating in the abdomen. Passage of more gas than usual.  Walking can help get rid of the air that was put into your GI tract during the procedure and reduce the bloating. If you had a lower endoscopy (such as a colonoscopy or flexible sigmoidoscopy) you may notice spotting of blood in your stool or on the toilet paper. If you underwent a bowel prep for your procedure, you may not have a normal bowel movement for a few days.  Please Note:  You might notice some irritation and congestion in your nose or some drainage.  This is from the oxygen used during your procedure.  There is no need for concern and it should clear up in a day or so.  SYMPTOMS TO REPORT IMMEDIATELY:   Following lower endoscopy (colonoscopy or flexible sigmoidoscopy):  Excessive amounts of blood in the stool  Significant tenderness or worsening of abdominal pains  Swelling of the abdomen that is new, acute  Fever of 100F or higher  For urgent or emergent issues, a gastroenterologist can be reached at any hour by calling [phone].   DIET:  We do recommend a small meal at first, but then you may proceed to your regular diet.  Drink plenty of fluids but you should avoid alcoholic beverages for 24 hours.  ACTIVITY:  You should plan to take it easy for the rest of today and you should NOT DRIVE or use heavy machinery until tomorrow (because of the sedation medicines used during the test).     FOLLOW UP: Our staff will call the number listed on your records the next business day following your procedure to check on you and address any questions or concerns that you may have regarding the information given to you following your procedure. If we do not reach you, we will leave a message.  However, if you are feeling well and you are not experiencing any problems, there is no need to return our call.  We will assume that you have returned to your regular daily activities without incident.  If any biopsies were taken you will be contacted by phone or by letter within the next 1-3 weeks.  Please call us at [phone] if you have not heard about the biopsies in 3 weeks.    SIGNATURES/CONFIDENTIALITY: You and/or your care partner have signed paperwork which will be entered into your electronic medical record.  These signatures attest to the fact that that the information above on your After Visit Summary has been reviewed and is understood.  Full responsibility of the confidentiality of this discharge information lies with you and/or your care-partner. 

## 2017-10-14 NOTE — Op Note (Signed)
[place] Patient Name: [name] Procedure Date: 10/14/2017 10:56 AM MRN: [id number] Endoscopist: Jackquline Denmark , MD Age: 72 Referring MD:  Date of Birth: Feb 08, 1946 Gender: Female Account #: [id number] Procedure:                Colonoscopy Indications:              High risk colon cancer surveillance: Personal                            history of colonic polyps Medicines:                Monitored Anesthesia Care Procedure:                Pre-Anesthesia Assessment:                           - Prior to the procedure, a History and Physical                            was performed, and patient medications and                            allergies were reviewed. The patient is competent.                            The risks and benefits of the procedure and the                            sedation options and risks were discussed with the                            patient. All questions were answered and informed                            consent was obtained. Patient identification and                            proposed procedure were verified by the physician                            in the procedure room. Mental Status Examination:                            alert and oriented. Prophylactic Antibiotics: The                            patient does not require prophylactic antibiotics.                            Prior Anticoagulants: The patient has taken                            aspirin, last dose was day of procedure. ASA Grade  Assessment: II - A patient with mild systemic                            disease. After reviewing the risks and benefits,                            the patient was deemed in satisfactory condition to                            undergo the procedure. The anesthesia plan was to                            use monitored anesthesia care (MAC). Immediately                            prior to administration of  medications, the patient                            was re-assessed for adequacy to receive sedatives.                            The heart rate, respiratory rate, oxygen                            saturations, blood pressure, adequacy of pulmonary                            ventilation, and response to care were monitored                            throughout the procedure. The physical status of                            the patient was re-assessed after the procedure.                           After obtaining informed consent, the colonoscope                            was passed under direct vision. Throughout the                            procedure, the patient's blood pressure, pulse, and                            oxygen saturations were monitored continuously. The                            Colonoscope was introduced through the anus and                            advanced to the 2 cm into the ileum. The  colonoscopy was performed without difficulty. The                            patient tolerated the procedure well. The quality                            of the bowel preparation was excellent. Scope In: 11:00:24 AM Scope Out: 11:11:08 AM Scope Withdrawal Time: 0 hours 6 minutes 37 seconds  Total Procedure Duration: 0 hours 10 minutes 44 seconds  Findings:                 A few rare diverticula were found in the sigmoid                            colon.                           Non-bleeding internal hemorrhoids were found. The                            hemorrhoids were small.                           The exam was otherwise without abnormality on                            direct and retroflexion views. Complications:            No immediate complications. Estimated Blood Loss:     Estimated blood loss: none. Impression:               - Minimal sigmoid diverticulosis                           - Small internal hemorrhoids.                            - Otherwise normal colonoscopy to terminal ileum. Recommendation:           - Patient has a contact number available for                            emergencies. The signs and symptoms of potential                            delayed complications were discussed with the                            patient. Return to normal activities tomorrow.                            Written discharge instructions were provided to the                            patient.                           - Resume previous  diet.                           - Continue present medications.                           - Repeat colonoscopy in 10 years for screening                            purposes. Earlier, if she starts having any new                            problems or if there is any change in family                            history.                           - Return to GI clinic PRN. Jackquline Denmark, MD 10/14/2017 11:16:16 AM This report has been signed electronically.

## 2017-10-14 NOTE — Progress Notes (Signed)
Pt's states no medical or surgical changes since previsit or office visit. 

## 2017-10-14 NOTE — Progress Notes (Signed)
Report given to PACU, vss 

## 2017-10-15 ENCOUNTER — Telehealth: Payer: Self-pay

## 2017-10-15 DIAGNOSIS — Z780 Asymptomatic menopausal state: Secondary | ICD-10-CM | POA: Diagnosis not present

## 2017-10-15 DIAGNOSIS — Z1231 Encounter for screening mammogram for malignant neoplasm of breast: Secondary | ICD-10-CM | POA: Diagnosis not present

## 2017-10-15 NOTE — Telephone Encounter (Signed)
  Follow up Call-  Call back number 10/14/2017  Post procedure Call Back phone  # [phone]  Permission to leave phone message Yes  Some recent data might be hidden     Patient questions:  Do you have a fever, pain , or abdominal swelling? No. Pain Score  0 *  Have you tolerated food without any problems? Yes.    Have you been able to return to your normal activities? Yes.    Do you have any questions about your discharge instructions: Diet   No. Medications  No. Follow up visit  No.  Do you have questions or concerns about your Care? No.  Actions: * If pain score is 4 or above: No action needed, pain <4.

## 2017-11-04 DIAGNOSIS — M19011 Primary osteoarthritis, right shoulder: Secondary | ICD-10-CM | POA: Diagnosis not present

## 2017-11-04 DIAGNOSIS — Z96611 Presence of right artificial shoulder joint: Secondary | ICD-10-CM | POA: Diagnosis not present

## 2017-11-04 DIAGNOSIS — Z9889 Other specified postprocedural states: Secondary | ICD-10-CM | POA: Diagnosis not present

## 2017-11-04 DIAGNOSIS — Z471 Aftercare following joint replacement surgery: Secondary | ICD-10-CM | POA: Diagnosis not present

## 2017-12-10 DIAGNOSIS — Z6831 Body mass index (BMI) 31.0-31.9, adult: Secondary | ICD-10-CM | POA: Diagnosis not present

## 2017-12-10 DIAGNOSIS — I10 Essential (primary) hypertension: Secondary | ICD-10-CM | POA: Diagnosis not present

## 2017-12-10 DIAGNOSIS — M545 Low back pain: Secondary | ICD-10-CM | POA: Diagnosis not present

## 2017-12-10 DIAGNOSIS — M542 Cervicalgia: Secondary | ICD-10-CM | POA: Diagnosis not present

## 2017-12-12 DIAGNOSIS — M545 Low back pain: Secondary | ICD-10-CM | POA: Diagnosis not present

## 2017-12-12 DIAGNOSIS — M5126 Other intervertebral disc displacement, lumbar region: Secondary | ICD-10-CM | POA: Diagnosis not present

## 2017-12-12 DIAGNOSIS — M48061 Spinal stenosis, lumbar region without neurogenic claudication: Secondary | ICD-10-CM | POA: Diagnosis not present

## 2017-12-27 DIAGNOSIS — I10 Essential (primary) hypertension: Secondary | ICD-10-CM | POA: Diagnosis not present

## 2017-12-27 DIAGNOSIS — E785 Hyperlipidemia, unspecified: Secondary | ICD-10-CM | POA: Diagnosis not present

## 2017-12-27 DIAGNOSIS — E559 Vitamin D deficiency, unspecified: Secondary | ICD-10-CM | POA: Diagnosis not present

## 2017-12-27 DIAGNOSIS — Z79899 Other long term (current) drug therapy: Secondary | ICD-10-CM | POA: Diagnosis not present

## 2017-12-27 DIAGNOSIS — E039 Hypothyroidism, unspecified: Secondary | ICD-10-CM | POA: Diagnosis not present

## 2017-12-30 DIAGNOSIS — M545 Low back pain: Secondary | ICD-10-CM | POA: Diagnosis not present

## 2017-12-30 DIAGNOSIS — I10 Essential (primary) hypertension: Secondary | ICD-10-CM | POA: Diagnosis not present

## 2017-12-30 DIAGNOSIS — Z6831 Body mass index (BMI) 31.0-31.9, adult: Secondary | ICD-10-CM | POA: Diagnosis not present

## 2018-01-17 DIAGNOSIS — N898 Other specified noninflammatory disorders of vagina: Secondary | ICD-10-CM | POA: Diagnosis not present

## 2018-01-17 DIAGNOSIS — Z90710 Acquired absence of both cervix and uterus: Secondary | ICD-10-CM | POA: Diagnosis not present

## 2018-02-09 DIAGNOSIS — Z23 Encounter for immunization: Secondary | ICD-10-CM | POA: Diagnosis not present

## 2018-03-25 DIAGNOSIS — Z8742 Personal history of other diseases of the female genital tract: Secondary | ICD-10-CM | POA: Diagnosis not present

## 2018-06-18 DIAGNOSIS — Z6831 Body mass index (BMI) 31.0-31.9, adult: Secondary | ICD-10-CM | POA: Diagnosis not present

## 2018-06-18 DIAGNOSIS — I10 Essential (primary) hypertension: Secondary | ICD-10-CM | POA: Diagnosis not present

## 2018-06-18 DIAGNOSIS — E039 Hypothyroidism, unspecified: Secondary | ICD-10-CM | POA: Diagnosis not present

## 2018-06-18 DIAGNOSIS — M150 Primary generalized (osteo)arthritis: Secondary | ICD-10-CM | POA: Diagnosis not present

## 2018-06-18 DIAGNOSIS — M5136 Other intervertebral disc degeneration, lumbar region: Secondary | ICD-10-CM | POA: Diagnosis not present

## 2018-06-18 DIAGNOSIS — E7800 Pure hypercholesterolemia, unspecified: Secondary | ICD-10-CM | POA: Diagnosis not present

## 2018-06-18 DIAGNOSIS — E669 Obesity, unspecified: Secondary | ICD-10-CM | POA: Diagnosis not present

## 2018-06-24 DIAGNOSIS — E039 Hypothyroidism, unspecified: Secondary | ICD-10-CM | POA: Diagnosis not present

## 2018-06-24 DIAGNOSIS — Z79899 Other long term (current) drug therapy: Secondary | ICD-10-CM | POA: Diagnosis not present

## 2018-06-24 DIAGNOSIS — E7800 Pure hypercholesterolemia, unspecified: Secondary | ICD-10-CM | POA: Diagnosis not present

## 2018-11-20 ENCOUNTER — Other Ambulatory Visit: Payer: Self-pay

## 2018-12-18 DIAGNOSIS — T7840XA Allergy, unspecified, initial encounter: Secondary | ICD-10-CM | POA: Diagnosis not present

## 2018-12-18 DIAGNOSIS — Z79899 Other long term (current) drug therapy: Secondary | ICD-10-CM | POA: Diagnosis not present

## 2018-12-18 DIAGNOSIS — M25552 Pain in left hip: Secondary | ICD-10-CM | POA: Diagnosis not present

## 2018-12-18 DIAGNOSIS — M549 Dorsalgia, unspecified: Secondary | ICD-10-CM | POA: Diagnosis not present

## 2018-12-18 DIAGNOSIS — L509 Urticaria, unspecified: Secondary | ICD-10-CM | POA: Diagnosis not present

## 2018-12-18 DIAGNOSIS — R1084 Generalized abdominal pain: Secondary | ICD-10-CM | POA: Diagnosis not present

## 2018-12-18 DIAGNOSIS — M25562 Pain in left knee: Secondary | ICD-10-CM | POA: Diagnosis not present

## 2018-12-18 DIAGNOSIS — R51 Headache: Secondary | ICD-10-CM | POA: Diagnosis not present

## 2018-12-18 DIAGNOSIS — M150 Primary generalized (osteo)arthritis: Secondary | ICD-10-CM | POA: Diagnosis not present

## 2018-12-18 DIAGNOSIS — E039 Hypothyroidism, unspecified: Secondary | ICD-10-CM | POA: Diagnosis not present

## 2018-12-18 DIAGNOSIS — E7800 Pure hypercholesterolemia, unspecified: Secondary | ICD-10-CM | POA: Diagnosis not present

## 2018-12-18 DIAGNOSIS — R5383 Other fatigue: Secondary | ICD-10-CM | POA: Diagnosis not present

## 2018-12-18 DIAGNOSIS — I10 Essential (primary) hypertension: Secondary | ICD-10-CM | POA: Diagnosis not present

## 2018-12-18 DIAGNOSIS — Z87442 Personal history of urinary calculi: Secondary | ICD-10-CM | POA: Diagnosis not present

## 2018-12-22 DIAGNOSIS — M545 Low back pain: Secondary | ICD-10-CM | POA: Diagnosis not present

## 2018-12-22 DIAGNOSIS — M549 Dorsalgia, unspecified: Secondary | ICD-10-CM | POA: Diagnosis not present

## 2018-12-22 DIAGNOSIS — K5900 Constipation, unspecified: Secondary | ICD-10-CM | POA: Diagnosis not present

## 2018-12-22 DIAGNOSIS — M25552 Pain in left hip: Secondary | ICD-10-CM | POA: Diagnosis not present

## 2018-12-22 DIAGNOSIS — R1084 Generalized abdominal pain: Secondary | ICD-10-CM | POA: Diagnosis not present

## 2018-12-22 DIAGNOSIS — M1712 Unilateral primary osteoarthritis, left knee: Secondary | ICD-10-CM | POA: Diagnosis not present

## 2018-12-22 DIAGNOSIS — Z87442 Personal history of urinary calculi: Secondary | ICD-10-CM | POA: Diagnosis not present

## 2018-12-22 DIAGNOSIS — M25562 Pain in left knee: Secondary | ICD-10-CM | POA: Diagnosis not present

## 2019-01-04 IMAGING — CR DG CHEST 2V
2 series · 2 of 2 positions shown · non-contrast
Comparison: None.

CLINICAL DATA: Preoperative evaluation for upcoming lumbar surgery.

EXAM:
CHEST  2 VIEW

[w chest pa]
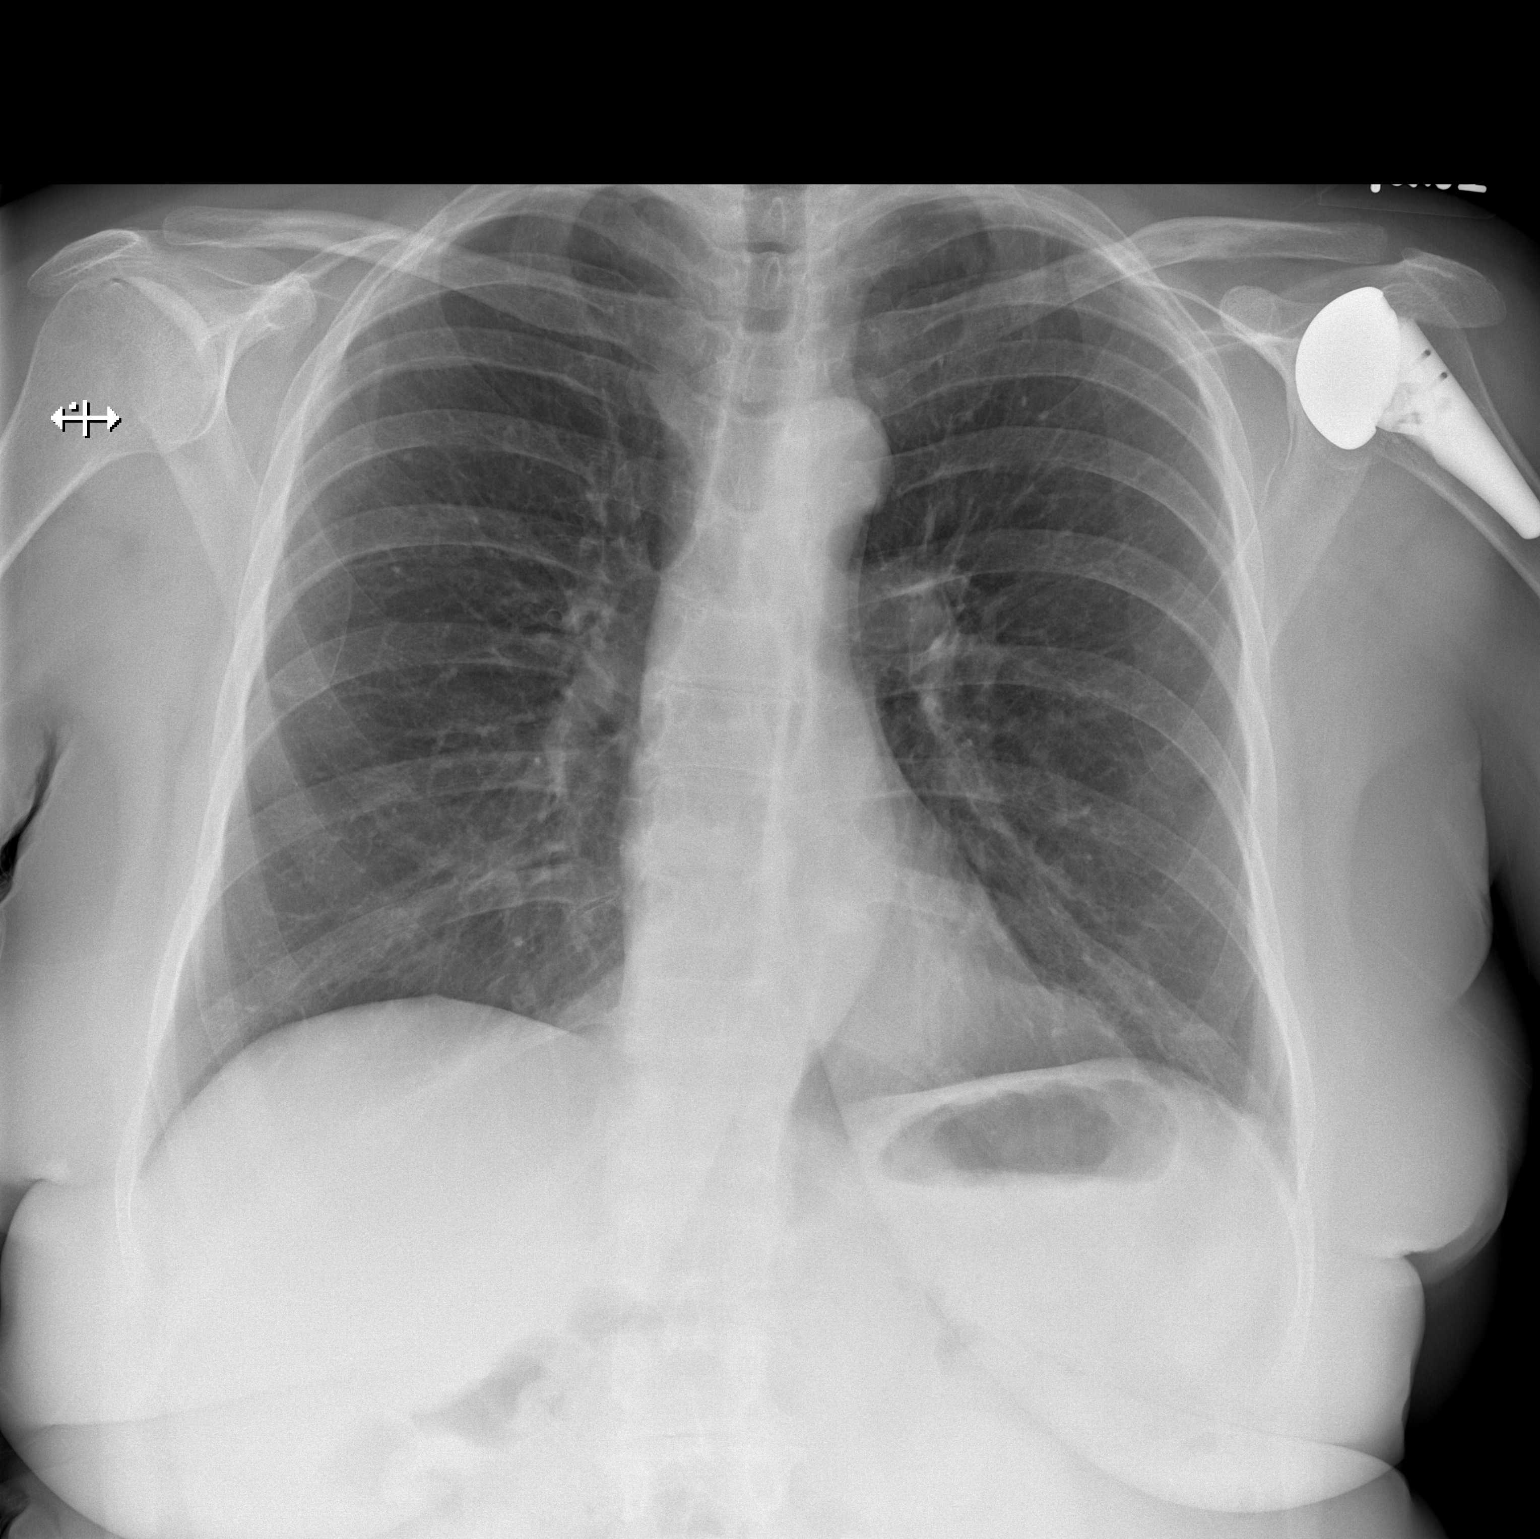

[w chest lat]
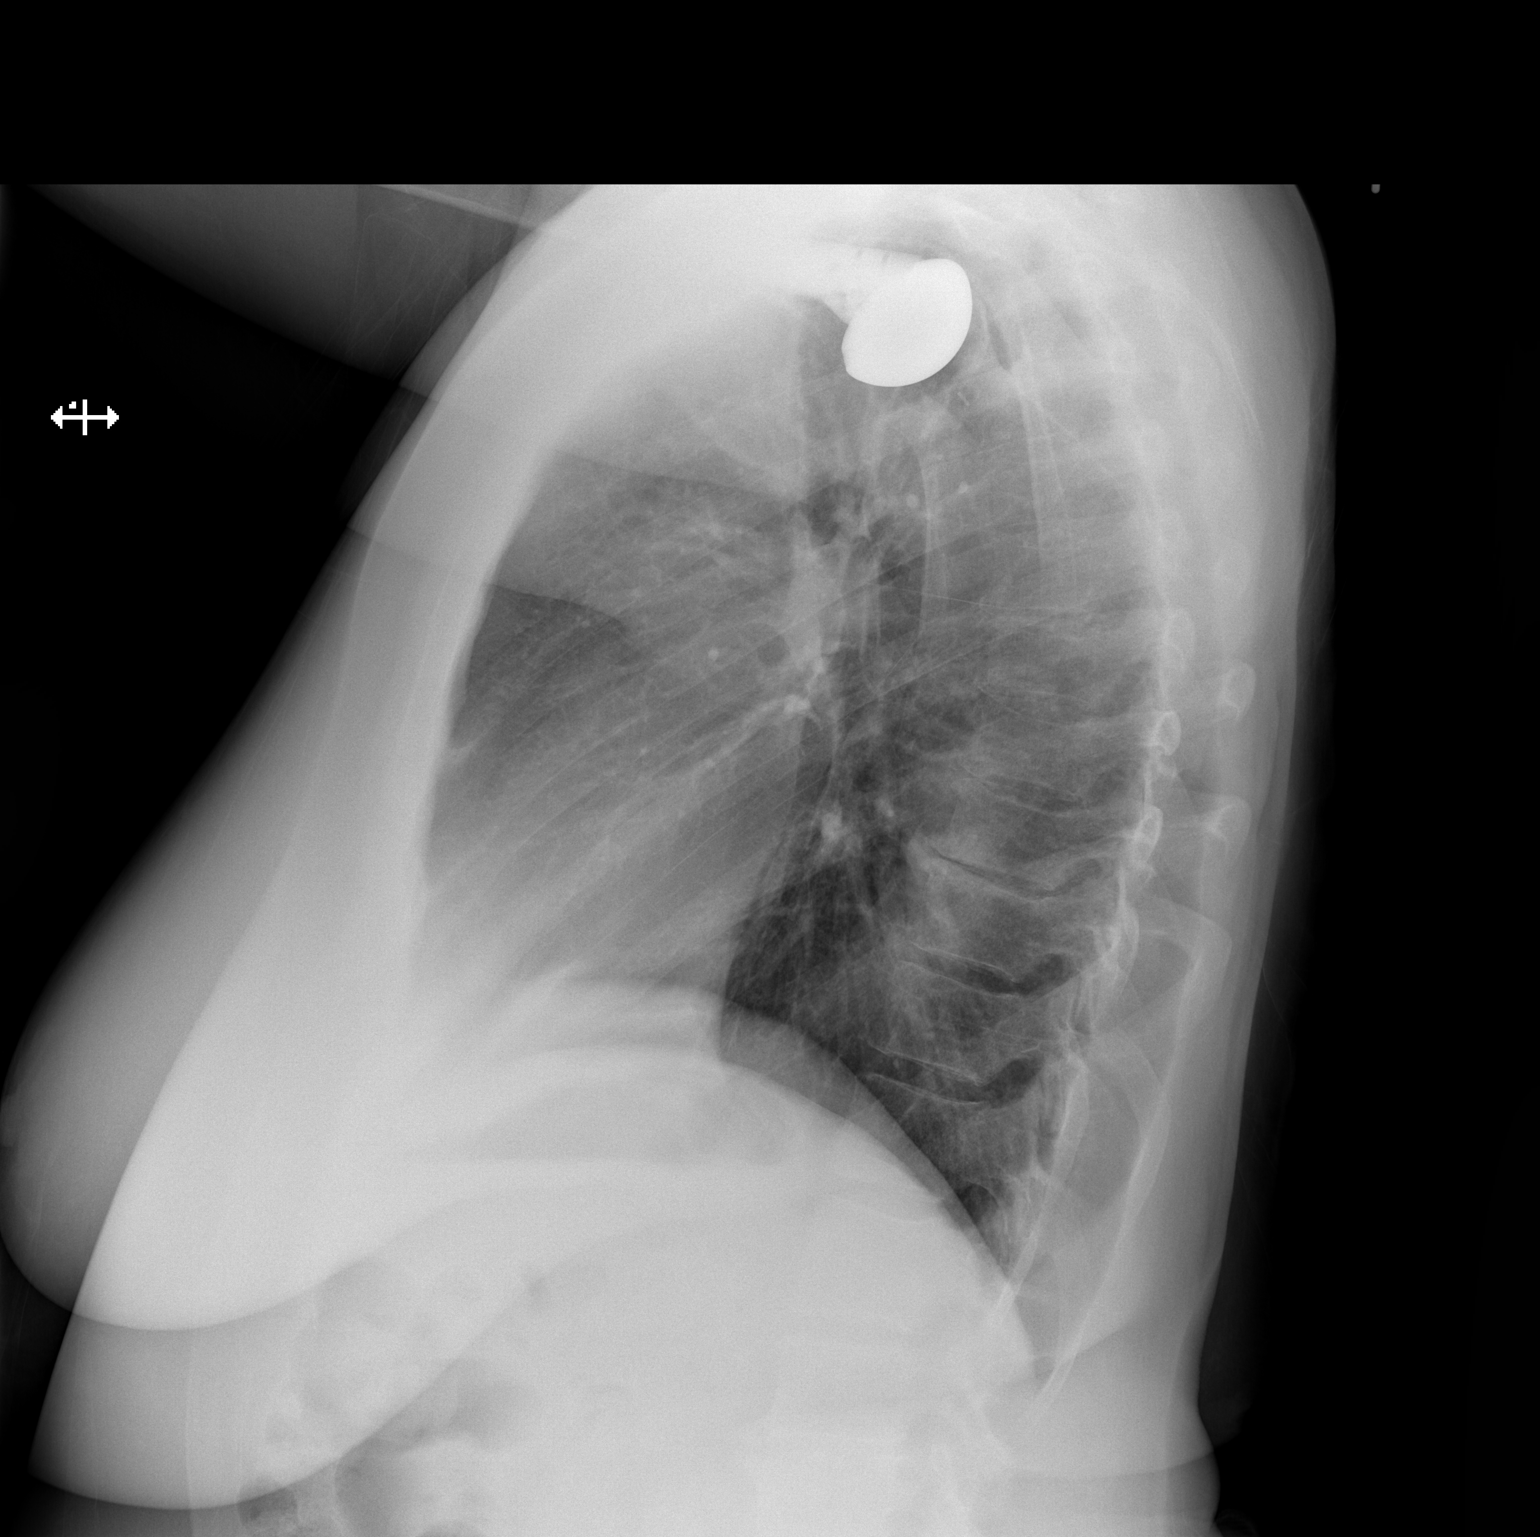

[2 of 2 positions shown; findings below may reference images not displayed]

FINDINGS: Cardiac shadow is within normal limits. The lungs are clear
bilaterally. Postsurgical changes in the left shoulder are noted. No
acute bony abnormality is seen.
IMPRESSION: No active cardiopulmonary disease.

## 2019-01-09 DIAGNOSIS — Z23 Encounter for immunization: Secondary | ICD-10-CM | POA: Diagnosis not present

## 2019-01-12 IMAGING — RF DG C-ARM 61-120 MIN
1 series · 2 of 2 positions shown · non-contrast
Comparison: None.

CLINICAL DATA: L4-5 PLIF.

EXAM:
DG C-ARM 61-120 MIN; LUMBAR SPINE - 2-3 VIEW
FLUOROSCOPY TIME:  77 seconds.

[Series 1: run · 2 of 2 slices shown]
[im 1/2]
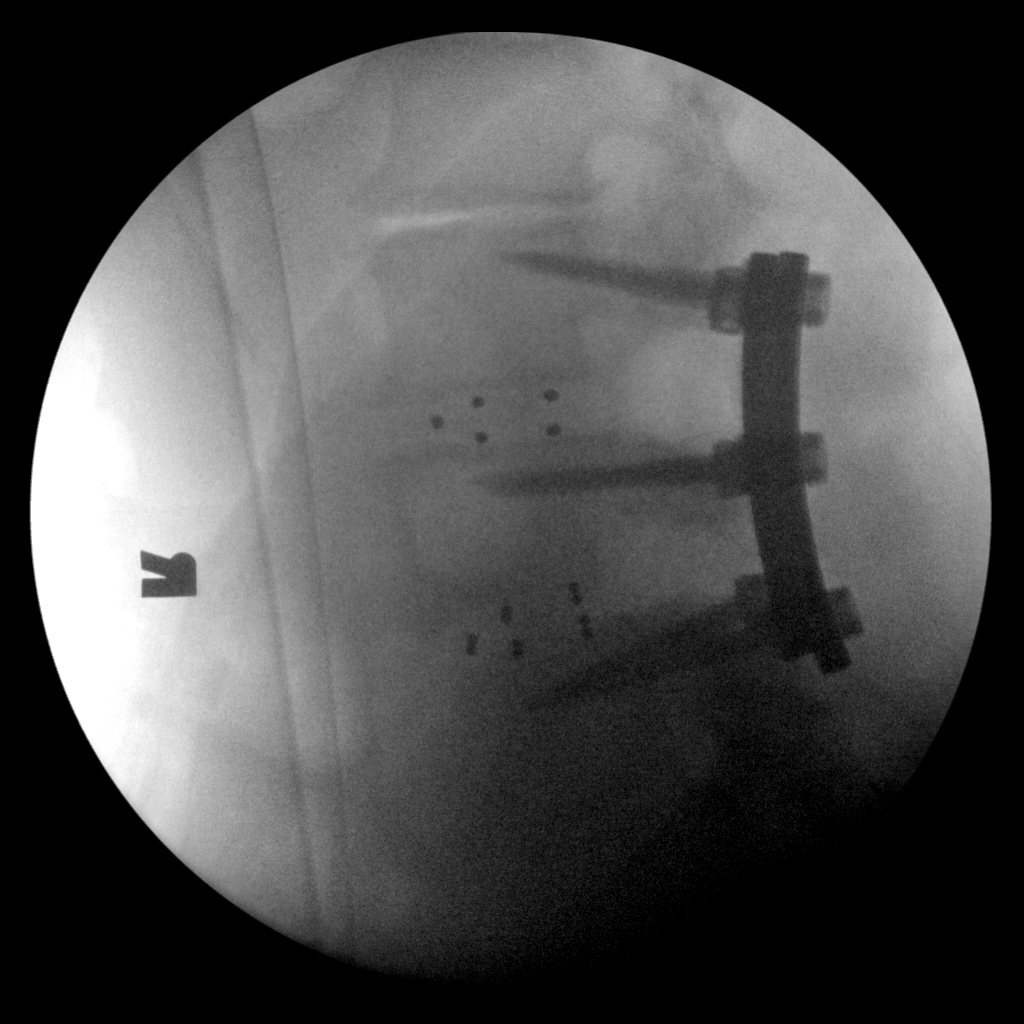
[im 2/2]
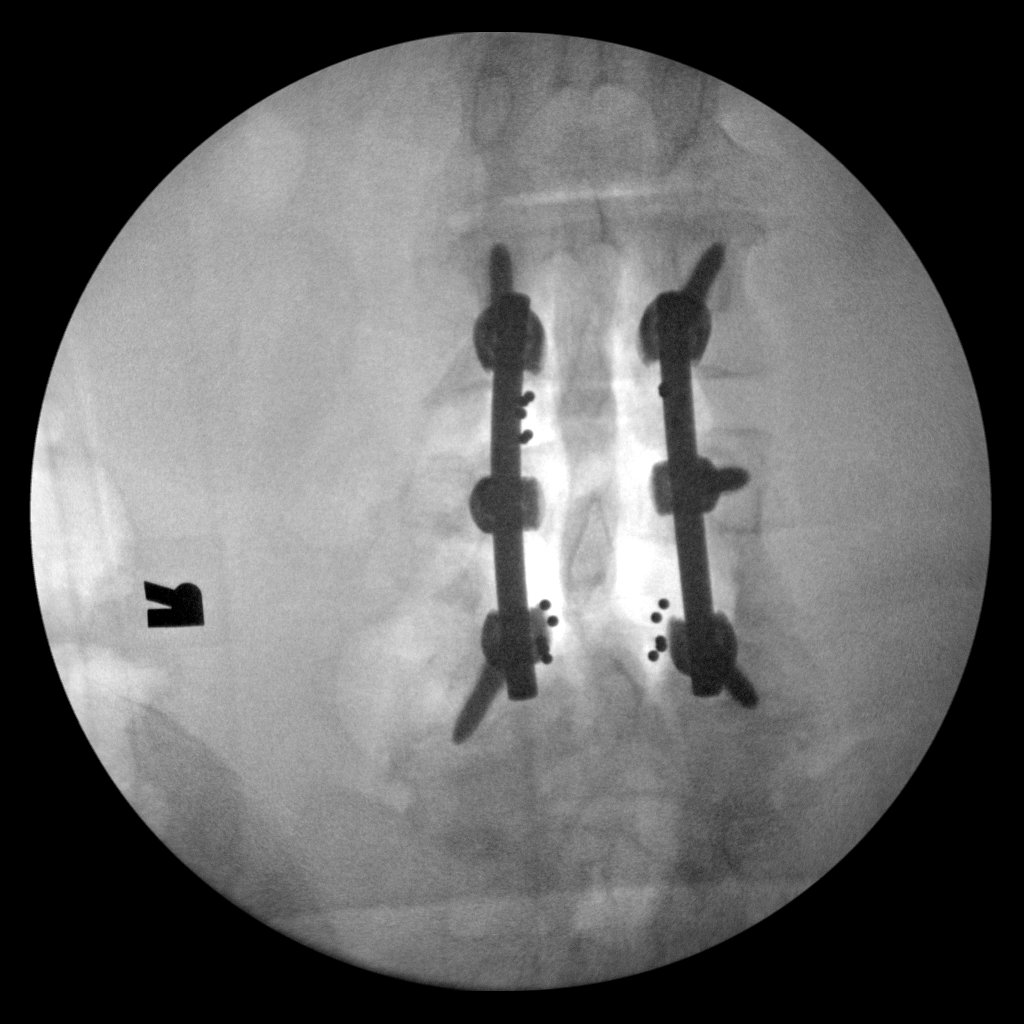

[2 of 2 positions shown; findings below may reference images not displayed]

FINDINGS: Two fluoroscopic spot views submitted, interpreting radiologist was
not present at time of procedure. L4-5 and L5-S1 PLIF with interbody
fusion device.
IMPRESSION: L4-5 and L5-S1 PLIF.

## 2019-01-16 DIAGNOSIS — M549 Dorsalgia, unspecified: Secondary | ICD-10-CM | POA: Diagnosis not present

## 2019-01-16 DIAGNOSIS — R413 Other amnesia: Secondary | ICD-10-CM | POA: Diagnosis not present

## 2019-01-16 DIAGNOSIS — R51 Headache: Secondary | ICD-10-CM | POA: Diagnosis not present

## 2019-01-16 DIAGNOSIS — N898 Other specified noninflammatory disorders of vagina: Secondary | ICD-10-CM | POA: Diagnosis not present

## 2019-01-16 DIAGNOSIS — Z87442 Personal history of urinary calculi: Secondary | ICD-10-CM | POA: Diagnosis not present

## 2019-01-16 DIAGNOSIS — L720 Epidermal cyst: Secondary | ICD-10-CM | POA: Diagnosis not present

## 2019-01-16 DIAGNOSIS — Z1331 Encounter for screening for depression: Secondary | ICD-10-CM | POA: Diagnosis not present

## 2019-01-16 DIAGNOSIS — Z6831 Body mass index (BMI) 31.0-31.9, adult: Secondary | ICD-10-CM | POA: Diagnosis not present

## 2019-02-05 DIAGNOSIS — M545 Low back pain: Secondary | ICD-10-CM | POA: Diagnosis not present

## 2019-02-05 DIAGNOSIS — M47816 Spondylosis without myelopathy or radiculopathy, lumbar region: Secondary | ICD-10-CM | POA: Diagnosis not present

## 2019-02-09 DIAGNOSIS — M47816 Spondylosis without myelopathy or radiculopathy, lumbar region: Secondary | ICD-10-CM | POA: Diagnosis not present

## 2019-02-09 DIAGNOSIS — M545 Low back pain: Secondary | ICD-10-CM | POA: Diagnosis not present

## 2019-02-16 DIAGNOSIS — M47816 Spondylosis without myelopathy or radiculopathy, lumbar region: Secondary | ICD-10-CM | POA: Diagnosis not present

## 2019-02-16 DIAGNOSIS — M545 Low back pain: Secondary | ICD-10-CM | POA: Diagnosis not present

## 2019-02-17 DIAGNOSIS — R413 Other amnesia: Secondary | ICD-10-CM | POA: Diagnosis not present

## 2019-02-17 DIAGNOSIS — R519 Headache, unspecified: Secondary | ICD-10-CM | POA: Diagnosis not present

## 2019-02-19 DIAGNOSIS — M47816 Spondylosis without myelopathy or radiculopathy, lumbar region: Secondary | ICD-10-CM | POA: Diagnosis not present

## 2019-02-19 DIAGNOSIS — M545 Low back pain: Secondary | ICD-10-CM | POA: Diagnosis not present

## 2019-02-23 DIAGNOSIS — M545 Low back pain: Secondary | ICD-10-CM | POA: Diagnosis not present

## 2019-02-23 DIAGNOSIS — M47816 Spondylosis without myelopathy or radiculopathy, lumbar region: Secondary | ICD-10-CM | POA: Diagnosis not present

## 2019-02-26 DIAGNOSIS — M47816 Spondylosis without myelopathy or radiculopathy, lumbar region: Secondary | ICD-10-CM | POA: Diagnosis not present

## 2019-02-26 DIAGNOSIS — M545 Low back pain: Secondary | ICD-10-CM | POA: Diagnosis not present

## 2019-03-02 DIAGNOSIS — M545 Low back pain: Secondary | ICD-10-CM | POA: Diagnosis not present

## 2019-03-02 DIAGNOSIS — M47816 Spondylosis without myelopathy or radiculopathy, lumbar region: Secondary | ICD-10-CM | POA: Diagnosis not present

## 2019-03-05 DIAGNOSIS — M47816 Spondylosis without myelopathy or radiculopathy, lumbar region: Secondary | ICD-10-CM | POA: Diagnosis not present

## 2019-03-05 DIAGNOSIS — M545 Low back pain: Secondary | ICD-10-CM | POA: Diagnosis not present

## 2019-03-10 DIAGNOSIS — M545 Low back pain: Secondary | ICD-10-CM | POA: Diagnosis not present

## 2019-03-10 DIAGNOSIS — M47816 Spondylosis without myelopathy or radiculopathy, lumbar region: Secondary | ICD-10-CM | POA: Diagnosis not present

## 2019-03-11 DIAGNOSIS — Z471 Aftercare following joint replacement surgery: Secondary | ICD-10-CM | POA: Diagnosis not present

## 2019-03-11 DIAGNOSIS — Z96612 Presence of left artificial shoulder joint: Secondary | ICD-10-CM | POA: Diagnosis not present

## 2019-03-11 DIAGNOSIS — Z96611 Presence of right artificial shoulder joint: Secondary | ICD-10-CM | POA: Diagnosis not present

## 2019-03-12 DIAGNOSIS — M545 Low back pain: Secondary | ICD-10-CM | POA: Diagnosis not present

## 2019-03-12 DIAGNOSIS — M47816 Spondylosis without myelopathy or radiculopathy, lumbar region: Secondary | ICD-10-CM | POA: Diagnosis not present

## 2019-03-16 DIAGNOSIS — M545 Low back pain: Secondary | ICD-10-CM | POA: Diagnosis not present

## 2019-03-16 DIAGNOSIS — M47816 Spondylosis without myelopathy or radiculopathy, lumbar region: Secondary | ICD-10-CM | POA: Diagnosis not present

## 2019-03-23 DIAGNOSIS — M47816 Spondylosis without myelopathy or radiculopathy, lumbar region: Secondary | ICD-10-CM | POA: Diagnosis not present

## 2019-03-23 DIAGNOSIS — M545 Low back pain: Secondary | ICD-10-CM | POA: Diagnosis not present

## 2019-05-07 DIAGNOSIS — Z79899 Other long term (current) drug therapy: Secondary | ICD-10-CM | POA: Diagnosis not present

## 2019-05-07 DIAGNOSIS — E7800 Pure hypercholesterolemia, unspecified: Secondary | ICD-10-CM | POA: Diagnosis not present

## 2019-05-07 DIAGNOSIS — I10 Essential (primary) hypertension: Secondary | ICD-10-CM | POA: Diagnosis not present

## 2019-05-07 DIAGNOSIS — M150 Primary generalized (osteo)arthritis: Secondary | ICD-10-CM | POA: Diagnosis not present

## 2019-05-07 DIAGNOSIS — E039 Hypothyroidism, unspecified: Secondary | ICD-10-CM | POA: Diagnosis not present

## 2019-10-13 DIAGNOSIS — Z0120 Encounter for dental examination and cleaning without abnormal findings: Secondary | ICD-10-CM | POA: Diagnosis not present

## 2019-11-10 DIAGNOSIS — M1611 Unilateral primary osteoarthritis, right hip: Secondary | ICD-10-CM | POA: Diagnosis not present

## 2019-11-10 DIAGNOSIS — M150 Primary generalized (osteo)arthritis: Secondary | ICD-10-CM | POA: Diagnosis not present

## 2019-11-10 DIAGNOSIS — R413 Other amnesia: Secondary | ICD-10-CM | POA: Diagnosis not present

## 2019-11-10 DIAGNOSIS — E039 Hypothyroidism, unspecified: Secondary | ICD-10-CM | POA: Diagnosis not present

## 2019-11-10 DIAGNOSIS — M47817 Spondylosis without myelopathy or radiculopathy, lumbosacral region: Secondary | ICD-10-CM | POA: Diagnosis not present

## 2019-11-10 DIAGNOSIS — M25551 Pain in right hip: Secondary | ICD-10-CM | POA: Diagnosis not present

## 2019-11-10 DIAGNOSIS — Z981 Arthrodesis status: Secondary | ICD-10-CM | POA: Diagnosis not present

## 2019-11-10 DIAGNOSIS — I10 Essential (primary) hypertension: Secondary | ICD-10-CM | POA: Diagnosis not present

## 2019-11-10 DIAGNOSIS — E7800 Pure hypercholesterolemia, unspecified: Secondary | ICD-10-CM | POA: Diagnosis not present

## 2020-02-03 DIAGNOSIS — R319 Hematuria, unspecified: Secondary | ICD-10-CM | POA: Diagnosis not present

## 2020-02-03 DIAGNOSIS — M47816 Spondylosis without myelopathy or radiculopathy, lumbar region: Secondary | ICD-10-CM | POA: Diagnosis not present

## 2020-02-03 DIAGNOSIS — N1330 Unspecified hydronephrosis: Secondary | ICD-10-CM | POA: Diagnosis not present

## 2020-02-03 DIAGNOSIS — R109 Unspecified abdominal pain: Secondary | ICD-10-CM | POA: Diagnosis not present

## 2020-02-03 DIAGNOSIS — I700 Atherosclerosis of aorta: Secondary | ICD-10-CM | POA: Diagnosis not present

## 2020-02-03 DIAGNOSIS — N200 Calculus of kidney: Secondary | ICD-10-CM | POA: Diagnosis not present

## 2020-02-03 DIAGNOSIS — N3001 Acute cystitis with hematuria: Secondary | ICD-10-CM | POA: Diagnosis not present

## 2020-02-03 DIAGNOSIS — I2510 Atherosclerotic heart disease of native coronary artery without angina pectoris: Secondary | ICD-10-CM | POA: Diagnosis not present

## 2020-02-04 DIAGNOSIS — R1084 Generalized abdominal pain: Secondary | ICD-10-CM | POA: Diagnosis not present

## 2020-02-13 DIAGNOSIS — K5900 Constipation, unspecified: Secondary | ICD-10-CM | POA: Diagnosis not present

## 2020-02-15 DIAGNOSIS — M533 Sacrococcygeal disorders, not elsewhere classified: Secondary | ICD-10-CM | POA: Diagnosis not present

## 2020-02-15 DIAGNOSIS — M25551 Pain in right hip: Secondary | ICD-10-CM | POA: Diagnosis not present

## 2020-02-15 DIAGNOSIS — M545 Low back pain, unspecified: Secondary | ICD-10-CM | POA: Diagnosis not present

## 2020-02-19 DIAGNOSIS — M545 Low back pain, unspecified: Secondary | ICD-10-CM | POA: Diagnosis not present

## 2020-02-22 DIAGNOSIS — I10 Essential (primary) hypertension: Secondary | ICD-10-CM | POA: Diagnosis not present

## 2020-02-22 DIAGNOSIS — Z79899 Other long term (current) drug therapy: Secondary | ICD-10-CM | POA: Diagnosis not present

## 2020-02-22 DIAGNOSIS — E039 Hypothyroidism, unspecified: Secondary | ICD-10-CM | POA: Diagnosis not present

## 2020-02-22 DIAGNOSIS — E7800 Pure hypercholesterolemia, unspecified: Secondary | ICD-10-CM | POA: Diagnosis not present

## 2020-03-01 DIAGNOSIS — M961 Postlaminectomy syndrome, not elsewhere classified: Secondary | ICD-10-CM | POA: Diagnosis not present

## 2020-03-01 DIAGNOSIS — M5116 Intervertebral disc disorders with radiculopathy, lumbar region: Secondary | ICD-10-CM | POA: Diagnosis not present

## 2020-03-01 DIAGNOSIS — M4726 Other spondylosis with radiculopathy, lumbar region: Secondary | ICD-10-CM | POA: Diagnosis not present

## 2020-03-01 DIAGNOSIS — M545 Low back pain, unspecified: Secondary | ICD-10-CM | POA: Diagnosis not present

## 2020-03-03 DIAGNOSIS — R300 Dysuria: Secondary | ICD-10-CM | POA: Diagnosis not present

## 2020-03-10 DIAGNOSIS — M5416 Radiculopathy, lumbar region: Secondary | ICD-10-CM | POA: Diagnosis not present

## 2020-03-10 DIAGNOSIS — Z6828 Body mass index (BMI) 28.0-28.9, adult: Secondary | ICD-10-CM | POA: Diagnosis not present

## 2020-03-10 DIAGNOSIS — M5116 Intervertebral disc disorders with radiculopathy, lumbar region: Secondary | ICD-10-CM | POA: Diagnosis not present

## 2020-03-10 DIAGNOSIS — M961 Postlaminectomy syndrome, not elsewhere classified: Secondary | ICD-10-CM | POA: Diagnosis not present

## 2020-03-20 DIAGNOSIS — M549 Dorsalgia, unspecified: Secondary | ICD-10-CM | POA: Diagnosis not present

## 2020-03-28 DIAGNOSIS — Z6828 Body mass index (BMI) 28.0-28.9, adult: Secondary | ICD-10-CM | POA: Diagnosis not present

## 2020-03-28 DIAGNOSIS — M5116 Intervertebral disc disorders with radiculopathy, lumbar region: Secondary | ICD-10-CM | POA: Diagnosis not present

## 2020-03-28 DIAGNOSIS — M961 Postlaminectomy syndrome, not elsewhere classified: Secondary | ICD-10-CM | POA: Diagnosis not present

## 2020-03-28 DIAGNOSIS — M5416 Radiculopathy, lumbar region: Secondary | ICD-10-CM | POA: Diagnosis not present

## 2020-04-13 DIAGNOSIS — M7061 Trochanteric bursitis, right hip: Secondary | ICD-10-CM | POA: Diagnosis not present

## 2020-04-13 DIAGNOSIS — F321 Major depressive disorder, single episode, moderate: Secondary | ICD-10-CM | POA: Diagnosis not present

## 2020-06-09 DIAGNOSIS — R413 Other amnesia: Secondary | ICD-10-CM | POA: Diagnosis not present

## 2020-06-09 DIAGNOSIS — Z1331 Encounter for screening for depression: Secondary | ICD-10-CM | POA: Diagnosis not present

## 2020-06-14 DIAGNOSIS — E7800 Pure hypercholesterolemia, unspecified: Secondary | ICD-10-CM | POA: Diagnosis not present

## 2020-06-14 DIAGNOSIS — R413 Other amnesia: Secondary | ICD-10-CM | POA: Diagnosis not present

## 2020-06-14 DIAGNOSIS — E039 Hypothyroidism, unspecified: Secondary | ICD-10-CM | POA: Diagnosis not present

## 2020-06-14 DIAGNOSIS — I10 Essential (primary) hypertension: Secondary | ICD-10-CM | POA: Diagnosis not present

## 2020-06-23 DIAGNOSIS — J322 Chronic ethmoidal sinusitis: Secondary | ICD-10-CM | POA: Diagnosis not present

## 2020-06-23 DIAGNOSIS — R413 Other amnesia: Secondary | ICD-10-CM | POA: Diagnosis not present

## 2020-06-23 DIAGNOSIS — J0120 Acute ethmoidal sinusitis, unspecified: Secondary | ICD-10-CM | POA: Diagnosis not present

## 2020-06-23 DIAGNOSIS — Z8673 Personal history of transient ischemic attack (TIA), and cerebral infarction without residual deficits: Secondary | ICD-10-CM | POA: Diagnosis not present

## 2020-07-04 DIAGNOSIS — N390 Urinary tract infection, site not specified: Secondary | ICD-10-CM | POA: Diagnosis not present

## 2020-07-04 DIAGNOSIS — Z6831 Body mass index (BMI) 31.0-31.9, adult: Secondary | ICD-10-CM | POA: Diagnosis not present

## 2020-07-04 DIAGNOSIS — R413 Other amnesia: Secondary | ICD-10-CM | POA: Diagnosis not present

## 2020-07-22 DIAGNOSIS — Z6831 Body mass index (BMI) 31.0-31.9, adult: Secondary | ICD-10-CM | POA: Diagnosis not present

## 2020-07-22 DIAGNOSIS — N390 Urinary tract infection, site not specified: Secondary | ICD-10-CM | POA: Diagnosis not present

## 2020-07-22 DIAGNOSIS — M159 Polyosteoarthritis, unspecified: Secondary | ICD-10-CM | POA: Diagnosis not present

## 2020-09-06 ENCOUNTER — Ambulatory Visit (INDEPENDENT_AMBULATORY_CARE_PROVIDER_SITE_OTHER): Payer: Medicare Other | Admitting: Neurology

## 2020-09-06 ENCOUNTER — Encounter: Payer: Self-pay | Admitting: Neurology

## 2020-09-06 VITALS — BP 146/86 | HR 73 | Ht 64.0 in | Wt 180.0 lb

## 2020-09-06 DIAGNOSIS — R2689 Other abnormalities of gait and mobility: Secondary | ICD-10-CM

## 2020-09-06 DIAGNOSIS — R4189 Other symptoms and signs involving cognitive functions and awareness: Secondary | ICD-10-CM | POA: Diagnosis not present

## 2020-09-06 DIAGNOSIS — M4802 Spinal stenosis, cervical region: Secondary | ICD-10-CM | POA: Diagnosis not present

## 2020-09-06 DIAGNOSIS — R270 Ataxia, unspecified: Secondary | ICD-10-CM

## 2020-09-06 NOTE — Patient Instructions (Addendum)
Formal memory testing Can consider Aricept/Donepezil FDG PET Scan for dementia - can consider this in the future if needed MRI cervical spine  Memory Compensation Strategies  1. Use "WARM" strategy.  W= write it down  A= associate it  R= repeat it  M= make a mental note  2.   You can keep a Social worker.  Use a 3-ring notebook with sections for the following: calendar, important names and phone numbers,  medications, doctors' names/phone numbers, lists/reminders, and a section to journal what you did  each day.   3.    Use a calendar to write appointments down.  4.    Write yourself a schedule for the day.  This can be placed on the calendar or in a separate section of the Memory Notebook.  Keeping a  regular schedule can help memory.  5.    Use medication organizer with sections for each day or morning/evening pills.  You may need help loading it  6.    Keep a basket, or pegboard by the door.  Place items that you need to take out with you in the basket or on the pegboard.  You may also want to  include a message board for reminders.  7.    Use sticky notes.  Place sticky notes with reminders in a place where the task is performed.  For example: " turn off the  stove" placed by the stove, "lock the door" placed on the door at eye level, " take your medications" on  the bathroom mirror or by the place where you normally take your medications.  8.    Use alarms/timers.  Use while cooking to remind yourself to check on food or as a reminder to take your medicine, or as a  reminder to make a call, or as a reminder to perform another task, etc.  Donepezil tablets What is this medicine? DONEPEZIL (doe NEP e zil) is used to treat mild to moderate dementia caused by Alzheimer's disease. This medicine may be used for other purposes; ask your health care provider or pharmacist if you have questions. COMMON BRAND NAME(S): Aricept What should I tell my health care provider before I take  this medicine? They need to know if you have any of these conditions:  asthma or other lung disease  difficulty passing urine  head injury  heart disease  history of irregular heartbeat  liver disease  seizures (convulsions)  stomach or intestinal disease, ulcers or stomach bleeding  an unusual or allergic reaction to donepezil, other medicines, foods, dyes, or preservatives  pregnant or trying to get pregnant  breast-feeding How should I use this medicine? Take this medicine by mouth with a glass of water. Follow the directions on the prescription label. You may take this medicine with or without food. Take this medicine at regular intervals. This medicine is usually taken before bedtime. Do not take it more often than directed. Continue to take your medicine even if you feel better. Do not stop taking except on your doctor's advice. If you are taking the 23 mg donepezil tablet, swallow it whole; do not cut, crush, or chew it. Talk to your pediatrician regarding the use of this medicine in children. Special care may be needed. Overdosage: If you think you have taken too much of this medicine contact a poison control center or emergency room at once. NOTE: This medicine is only for you. Do not share this medicine with others. What if I miss a  dose? If you miss a dose, take it as soon as you can. If it is almost time for your next dose, take only that dose, do not take double or extra doses. What may interact with this medicine? Do not take this medicine with any of the following medications:  certain medicines for fungal infections like itraconazole, fluconazole, posaconazole, and voriconazole  cisapride  dextromethorphan; quinidine  dronedarone  pimozide  quinidine  thioridazine This medicine may also interact with the following medications:  antihistamines for allergy, cough and cold  atropine  bethanechol  carbamazepine  certain medicines for bladder  problems like oxybutynin, tolterodine  certain medicines for Parkinson's disease like benztropine, trihexyphenidyl  certain medicines for stomach problems like dicyclomine, hyoscyamine  certain medicines for travel sickness like scopolamine  dexamethasone  dofetilide  ipratropium  NSAIDs, medicines for pain and inflammation, like ibuprofen or naproxen  other medicines for Alzheimer's disease  other medicines that prolong the QT interval (cause an abnormal heart rhythm)  phenobarbital  phenytoin  rifampin, rifabutin or rifapentine  ziprasidone This list may not describe all possible interactions. Give your health care provider a list of all the medicines, herbs, non-prescription drugs, or dietary supplements you use. Also tell them if you smoke, drink alcohol, or use illegal drugs. Some items may interact with your medicine. What should I watch for while using this medicine? Visit your doctor or health care professional for regular checks on your progress. Check with your doctor or health care professional if your symptoms do not get better or if they get worse. You may get drowsy or dizzy. Do not drive, use machinery, or do anything that needs mental alertness until you know how this drug affects you. What side effects may I notice from receiving this medicine? Side effects that you should report to your doctor or health care professional as soon as possible:  allergic reactions like skin rash, itching or hives, swelling of the face, lips, or tongue  feeling faint or lightheaded, falls  loss of bladder control  seizures  signs and symptoms of a dangerous change in heartbeat or heart rhythm like chest pain; dizziness; fast or irregular heartbeat; palpitations; feeling faint or lightheaded, falls; breathing problems  signs and symptoms of infection like fever or chills; cough; sore throat; pain or trouble passing urine  signs and symptoms of liver injury like dark yellow  or brown urine; general ill feeling or flu-like symptoms; light-colored stools; loss of appetite; nausea; right upper belly pain; unusually weak or tired; yellowing of the eyes or skin  slow heartbeat or palpitations  unusual bleeding or bruising  vomiting Side effects that usually do not require medical attention (report to your doctor or health care professional if they continue or are bothersome):  diarrhea, especially when starting treatment  headache  loss of appetite  muscle cramps  nausea  stomach upset This list may not describe all possible side effects. Call your doctor for medical advice about side effects. You may report side effects to FDA at 1-800-FDA-1088. Where should I keep my medicine? Keep out of reach of children. Store at room temperature between 15 and 30 degrees C (59 and 86 degrees F). Throw away any unused medicine after the expiration date. NOTE: This sheet is a summary. It may not cover all possible information. If you have questions about this medicine, talk to your doctor, pharmacist, or health care provider.  2021 [employer] Standard (2018-03-31 10:33:41)

## 2020-09-06 NOTE — Progress Notes (Signed)
[facility]    Provider:  Dr Jaynee Eagles Requesting Provider: Ernestene Kiel, MD Primary Care Provider:  Ernestene Kiel, MD  CC:  Memory loss  HPI:  [name] is a 75 y.o. female here as requested by Ernestene Kiel, MD for memory loss.  Past medical history thyroid disease, high blood pressure, degenerative disc disease, subjective memory loss, osteoarthritis, elevated cholesterol, depression, kidney stones.  I reviewed Dr. Noe Gens notes, patient complaining of memory issues, memory issues have not resolved, patient states that she is having to use a GPS to get places now, she is lived in [place] for 38 years and now having to use a GPS to find her way, her children have noticed that she is more forgetful, today she had to think about which drawer in her kitchen she needed to go to get something out, worsening for the last 2 to 3 months, before that she was having issues with recalling a person's name if she saw them out of context.   The last year or two she started noticing some memory changes, she is here with her daughter who also provides information. Around Christmas she developed nerve pain and she was taken to urgent care and she had an mri of her back and she had ruptured a disk, She was given Lyrica and after that she had sudden onset of increased memory problems, dramatic to her, she would walk into her kitchen and she wouldn't know where her silverware was, she got lost going into town, she was using GPS to find places she was going to for years, she may have better days then others. She was vacicnated in 2/21 and last booster of covid 10/21. She is a sensitive person, she can be talking about something and tear up, she cries at commercials, she is very emotional. She has had a hx of depression and her christian faith has helped overcome that. Also balance issues. She lives with her husband, her husband is sick has cancer and there is stress and has been  going on for 2 years. Her half brother had some sort of mental problem. Mother and father without dementia. Mom lived to 36s and father to 49s. She is not aware of any dementia.   Reviewed notes, labs and imaging from outside physicians, which showed:  I reviewed MRI of the brain report from [facility] radiology completed at [facility] June 21, 2020: Cerebral volume is normal for age, similar to prior MRI of 02/17/2019, there is minimal multifocal T2 flair hyperintensity within the cerebral white matter which is nonspecific but compatible with chronic small vessel ischemic disease, prominent perivascular space within the left midbrain, no cortical encephalomalacia identified, no acute infarct, no evidence of intracranial mass, no chronic intracranial blood products, no extra-axial fluid collection, no midline shift, partially empty sella Turcica, expected proximal artery flow voids.  Impression: No evidence of acute intracranial abnormality, no evidence of acute or recent subacute infarction, stable noncontrast MRI appearance of the brain is compared to February 17, 2019, minimum chronic small vessel ischemic changes within the cerebral white matter.  Blood work collected June 15, 2020: CMP normal with BUN 11 and creatinine 0.79, CBC normal, B12 599.  Hemoglobin A1c 5.3.  TSH normal 3.68.  RPR nonreactive.  Review of Systems: Patient complains of symptoms per HPI as well as the following symptoms: imbalance. Pertinent negatives and positives per HPI. All others negative.   Social History   Socioeconomic History  . Marital status: Married  Spouse name: Not on file  . Number of children: Not on file  . Years of education: Not on file  . Highest education level: Not on file  Occupational History  . Not on file  Tobacco Use  . Smoking status: Never Smoker  . Smokeless tobacco: Never Used  Vaping Use  . Vaping Use: Never used  Substance and Sexual Activity  . Alcohol use: Yes     Alcohol/week: 0.0 standard drinks    Comment: rarely  . Drug use: No  . Sexual activity: Not on file  Other Topics Concern  . Not on file  Social History Narrative  . Not on file   Social Determinants of Health   Financial Resource Strain: Not on file  Food Insecurity: Not on file  Transportation Needs: Not on file  Physical Activity: Not on file  Stress: Not on file  Social Connections: Not on file  Intimate Partner Violence: Not on file    Family History  Problem Relation Age of Onset  . Lung cancer Mother   . Lung cancer Father   . Alcoholism Father   . Alcoholism Brother   . Dementia Neg Hx     Past Medical History:  Diagnosis Date  . Arthritis   . Cataracts, bilateral    immature  . Chronic back pain    spondylolisthesis  . Complication of anesthesia    unable to get up due dizziness,nausea- stayed overnite  . Constipation    takes Colace daily  . Depression    but not on any meds  . History of colon polyps    benign one time and precancerous another  . History of colon polyps   . History of kidney stones 12/2015  . Hyperlipidemia    takes Pravastatin daily  . Hypertension    takes Losartan daily  . Hypothyroidism    takes Synthroid daily  . Kidney stones   . Numbness    tingling/right lower leg   . OA (osteoarthritis)   . PONV (postoperative nausea and vomiting)   . Shoulder arthritis    Right    Patient Active Problem List   Diagnosis Date Noted  . Subjective memory complaints 09/06/2020  . S/p reverse total shoulder arthroplasty 06/13/2017  . Status post total shoulder arthroplasty 04/18/2017  . S/P lumbar spinal fusion 07/19/2016  . S/P shoulder replacement 01/19/2016    Past Surgical History:  Procedure Laterality Date  . ABDOMINAL HYSTERECTOMY    . ABDOMINAL HYSTERECTOMY  2012   bladder tack  . BACK SURGERY    . COLONOSCOPY  2014   Polyps  . MAXIMUM ACCESS (MAS)POSTERIOR LUMBAR INTERBODY FUSION (PLIF) 1 LEVEL N/A 07/19/2016    Procedure: LUMBAR FOUR-FIVE MAXIMUM ACCESS (MAS) POSTERIOR LUMBAR INTERBODY FUSION (PLIF), INSTRUMENTED LUMBAR THREE-FIVE;  Surgeon: Eustace Moore, MD;  Location: [place];  Service: Neurosurgery;  Laterality: N/A;  . REVERSE SHOULDER ARTHROPLASTY Right 06/13/2017   Procedure: Conversion of right total shoulder arthroplasty to reverse shoulder arthroplasty;  Surgeon: Justice Britain, MD;  Location: [place];  Service: Orthopedics;  Laterality: Right;  . SHOULDER SURGERY Left 2004   arthroscopy  . SHOULDER SURGERY Right 06/13/2017    Conversion of right total shoulder arthroplasty to reverse shoulder arthroplasty (Right Shoulder)  . TOTAL SHOULDER ARTHROPLASTY Left 01/19/2016   Procedure: TOTAL SHOULDER ARTHROPLASTY;  Surgeon: Justice Britain, MD;  Location: [place];  Service: Orthopedics;  Laterality: Left;  . TOTAL SHOULDER ARTHROPLASTY Right 04/18/2017   Procedure: RIGHT TOTAL SHOULDER  ARTHROPLASTY;  Surgeon: Justice Britain, MD;  Location: [place];  Service: Orthopedics;  Laterality: Right;  . TUBAL LIGATION  02/1983    Current Outpatient Medications  Medication Sig Dispense Refill  . acetaminophen (TYLENOL) 650 MG CR tablet Take 650-1,300 mg by mouth 3 (three) times daily as needed for pain.     . L-LYSINE PO Take 0.5 tablets by mouth daily.     Marland Kitchen levothyroxine (SYNTHROID, LEVOTHROID) 50 MCG tablet Take 50 mcg by mouth daily before breakfast.     . losartan (COZAAR) 25 MG tablet Take 25 mg by mouth once daily  0  . Melatonin 10 MG TABS Take by mouth.    . meloxicam (MOBIC) 15 MG tablet Take 1 tablet by mouth daily.    . Omega-3 Fatty Acids (FISH OIL) 1200 MG CAPS Take 1,200 mg by mouth every evening.    . pravastatin (PRAVACHOL) 40 MG tablet Take 40 mg by mouth at bedtime.     . QC STOOL SOFTENER 100 MG capsule Take 100 mg by mouth 2 (two) times daily as needed.    . diclofenac sodium (VOLTAREN) 1 % GEL Apply 1 application topically 3 (three) times daily as needed (joint pain).     Current  Facility-Administered Medications  Medication Dose Route Frequency Provider Last Rate Last Admin  . 0.9 %  sodium chloride infusion  500 mL Intravenous Once Jackquline Denmark, MD        Allergies as of 09/06/2020  . (No Known Allergies)    Vitals: BP (!) 146/86 (BP Location: Left Arm, Patient Position: Sitting)   Pulse 73   Ht 5\' 4"  (1.626 m)   Wt 180 lb (81.6 kg)   BMI 30.90 kg/m  Last Weight:  Wt Readings from Last 1 Encounters:  09/06/20 180 lb (81.6 kg)   Last Height:   Ht Readings from Last 1 Encounters:  09/06/20 5\' 4"  (1.626 m)     Physical exam: Exam: Gen: NAD, conversant, well nourised, overweight, well groomed                     CV: RRR, no MRG. No Carotid Bruits. No peripheral edema, warm, nontender Eyes: Conjunctivae clear without exudates or hemorrhage  Neuro: Detailed Neurologic Exam  Speech:    Speech is normal; fluent and spontaneous with normal comprehension.  Cognition:    The patient is oriented to person, place, and time;     recent and remote memory intact;     language fluent;     normal attention, concentration,     fund of knowledge Cranial Nerves:    The pupils are equal, round, and reactive to light.  Pupils too small to visualize fundi.  Visual fields are full to finger confrontation. Extraocular movements are intact. Trigeminal sensation is intact and the muscles of mastication are normal. The face is symmetric. The palate elevates in the midline. Hearing intact. Voice is normal. Shoulder shrug is normal. The tongue has normal motion without fasciculations.   Coordination:    Normal finger to nose and heel to shin.   Gait:    Heel-toe and tandem gait are normal.   Motor Observation:    No asymmetry, no atrophy, and no involuntary movements noted. Tone:    Normal muscle tone.    Posture:    Posture is normal. normal erect    Strength:    Has some back pain and shoulder replacements limiting exam but no focal weakness notes  Sensation: intact to LT     Reflex Exam:  DTR's:    Deep tendon reflexes in the upper and lower extremities are brisk bilaterally.   Toes:    The toes are downgoing bilaterally.   Clonus:    Clonus is absent.    Assessment/Plan: Patient is here for memory loss as well as imbalance.   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  She has had problems with depression in the past but denies current symptoms. Formal memory testing. B12, tsh normal -  For her imbalance, she has brisk reflexes, positive Hoffmann's, needs MRI of the cervical spine for possible cervical stenosis. - Poor sleep, takes naps, is told she snores. Declines sleep evaluation. - consider Aricept pending results - consider FDG PET Scan in the future as warranted  Orders Placed This Encounter  Procedures  . MR CERVICAL SPINE W WO CONTRAST  . Homocysteine  . Vitamin B1  . Ambulatory referral to Neuropsychology   No orders of the defined types were placed in this encounter.   Cc: Ernestene Kiel, MD,  Ernestene Kiel, MD  Sarina Ill, MD  [facility] [address] [place],  [address]  Phone [phone] Fax [phone]

## 2020-09-07 LAB — VITAMIN B1

## 2020-09-12 LAB — HOMOCYSTEINE: Homocysteine: 11.2 umol/L (ref 0.0–19.2)

## 2020-09-13 ENCOUNTER — Encounter: Payer: Self-pay | Admitting: Counselor

## 2020-09-14 ENCOUNTER — Telehealth: Payer: Self-pay | Admitting: Neurology

## 2020-09-14 NOTE — Telephone Encounter (Signed)
Pt called wanting to know if suppose to be taking Donepezil.. Pt stated read in my after appt summary. Would like a call from the nurse.

## 2020-09-14 NOTE — Telephone Encounter (Signed)
Spoke with patient. She understand the Aricept is something to consider pending testing. Pt has formal memory testing scheduled for August. She will see if she can be placed on a wait list. Pt also understands we will call when we receive the results of the MRI c-spine. Pt verbalized appreciation for the call and her questions were answered.

## 2020-09-14 NOTE — Telephone Encounter (Signed)
From office note:   - MRI brain normal for age, MMSE 72/30, MCI amnestic type vs Pseudodementia due to chronic pain or stress (husband has cancer).  She has had problems with depression in the past but denies current symptoms. Formal memory testing. B12, tsh normal -  For her imbalance, she has brisk reflexes, positive Hoffmann's, needs MRI of the cervical spine for possible cervical stenosis. - Poor sleep, takes naps, is told she snores. Declines sleep evaluation. - consider Aricept pending results - consider FDG PET Scan in the future as warranted  Pt has pending formal memory testing still.

## 2020-09-20 ENCOUNTER — Telehealth: Payer: Self-pay | Admitting: Neurology

## 2020-09-20 NOTE — Telephone Encounter (Addendum)
Patient would like MRI cervical spine w/wo contrast sent to [facility]. I called [facility] @ [phone] and spoke with Kathlee Nations. She advised to fax the order to [phone]. Gave order to MD to sign. I called patient's insurance, [employer], and spoke with Myra to check PA requirements for [phone]. She states no PA is required. Member ID is the reference number.

## 2020-09-22 DIAGNOSIS — M4802 Spinal stenosis, cervical region: Secondary | ICD-10-CM | POA: Diagnosis not present

## 2020-09-22 DIAGNOSIS — M47812 Spondylosis without myelopathy or radiculopathy, cervical region: Secondary | ICD-10-CM | POA: Diagnosis not present

## 2020-09-22 DIAGNOSIS — R270 Ataxia, unspecified: Secondary | ICD-10-CM | POA: Diagnosis not present

## 2020-09-22 DIAGNOSIS — M50221 Other cervical disc displacement at C4-C5 level: Secondary | ICD-10-CM | POA: Diagnosis not present

## 2020-09-27 ENCOUNTER — Telehealth: Payer: Self-pay | Admitting: Neurology

## 2020-09-27 ENCOUNTER — Telehealth: Payer: Self-pay | Admitting: *Deleted

## 2020-09-27 DIAGNOSIS — R0683 Snoring: Secondary | ICD-10-CM | POA: Diagnosis not present

## 2020-09-27 DIAGNOSIS — I10 Essential (primary) hypertension: Secondary | ICD-10-CM | POA: Diagnosis not present

## 2020-09-27 DIAGNOSIS — E039 Hypothyroidism, unspecified: Secondary | ICD-10-CM | POA: Diagnosis not present

## 2020-09-27 DIAGNOSIS — M150 Primary generalized (osteo)arthritis: Secondary | ICD-10-CM | POA: Diagnosis not present

## 2020-09-27 NOTE — Telephone Encounter (Signed)
Received MRI C-spine results from [facility]. Results given to Dr Jannifer Franklin to review/result as Dr Jaynee Eagles is out of the office currently.

## 2020-09-27 NOTE — Telephone Encounter (Signed)
I called the patient.  MRI of the cervical spine does show some neuroforaminal stenosis but no evidence of spinal stenosis or spinal cord compression that would affect balance or increased reflexes.  I discussed this with the patient.   MRI cervical 09/22/20:  Impression: 1.  Progressive disc and facet degeneration at C4-5 with mild spinal stenosis and severe left greater than right foraminal stenosis. 2.  Mild to moderate neural foraminal stenosis at the C6-7 level, stable to mildly increased. 3.  Unchanged moderate to severe right foraminal stenosis at C5-6.

## 2020-10-04 ENCOUNTER — Telehealth: Payer: Self-pay | Admitting: Neurology

## 2020-10-04 NOTE — Telephone Encounter (Signed)
I returned the pt's call and advised per Dr Jaynee Eagles that patient would need to request a referral from her primary care doctor. Pt verbalized understanding and appreciation for the call. Her questions were answered.

## 2020-10-04 NOTE — Telephone Encounter (Signed)
Pt called stating that it was recommended to her to get a sleep evaluation but our sleep lab is out of network for her and she would like to know if somewhere else can be recommended. Please advise.

## 2020-12-14 ENCOUNTER — Ambulatory Visit (INDEPENDENT_AMBULATORY_CARE_PROVIDER_SITE_OTHER): Payer: Medicare Other | Admitting: Counselor

## 2020-12-14 ENCOUNTER — Encounter: Payer: Self-pay | Admitting: Counselor

## 2020-12-14 ENCOUNTER — Other Ambulatory Visit: Payer: Self-pay

## 2020-12-14 DIAGNOSIS — G3184 Mild cognitive impairment, so stated: Secondary | ICD-10-CM

## 2020-12-14 DIAGNOSIS — Z634 Disappearance and death of family member: Secondary | ICD-10-CM

## 2020-12-14 NOTE — Progress Notes (Signed)
[facility]  Patient Name: [name] MRN: [id number] Date of Birth: [birth year] Age: 75 y.o. Education: 14 years  Referral Circumstances and Background Information  [name] is a 75 y.o., right-hand dominant, widowed woman with a history of HTN, hypercholesterolemia, and memory changes. She was referred by Dr. Jaynee Eagles at [facility], my thanks to Dr. Jaynee Eagles for her characteristically detailed notes and history. Review of those notes shows the patient to have noticed memory changes since shortly after Christmas after taking Lyrica related to back problems.   On interview, the patient herself reported that she has had problems with memory and thinking over the past year. She is here with her daughter [name] who stated that there was nothing noticeable until one day, she got lost in  at least 6 months ago and then they started noticing changes. It sounds like they perceive the changes to be abrupt and acute in onset. She noticed these difficulties after she was placed on Lyrica, although she was only on it for a few days. She has been off it for some time and her problems are no better. On specific review of symptoms, the patient's daughter said that she will repeat things she has said "many times," she has become quite dependent on notes to remind her of things she needs to do, and she has gotten lost on occasion. She had to go on a detour because of an accident and her phone wasn't working so she got turned around. They both admit that there is a "panic" type component that goes with the memory problems, because she gets worked up and then doesn't think clearly. This is particularly true with her driving problems She does not have any rapid forgetting of information but she will repeat things over the course of a day. Affectively, they feel like her emotions are "real close to the surface," which coincided with when her memory problems started. Otherwise it  does not sound like there are any significant behavior changes. They denied much in the way of language problems or difficulties with orientation. With respect to mood, the patient reported that she used to be depressed but has overcome it. She is currently grieving because her husband passed a month ago, but otherwise, she does fairly well. She stated that her energy is "pretty good" for her age, she still takes care of her property such as mowing the lawn and the like. She reported having sleeping issues, although she denied problems falling asleep or staying asleep, it is more that she does not feel rested. She does have some pain issues that affect her sleep at times.   The patient is denying that there is anything that she used to do that she cannot do now. Nevertheless, she does have a harder time doing things. She has always managed the finances and perceives herself to be good at math and she has to think about it more than in the past. She denied that she has made any mistakes, although her daughter has started to monitor because she is concerned and wants to make sure everything is ok. She denied that this is because of a specific problem. They were vague about her driving. She apparently uses her GPS extensively, but she said she has done that for years. She obviously did have some issues as above, where she got lost, although I wasn't able to ascertain if this is a common day-to-day problem. They denied that she is having any  problems using her smart phone, she does not use a computer. She uses an iPadand has no difficulties. She doesn't cook much although they denied that she has any issues when she does. She is able to go shopping and does not have any problems in that regard.   Past Medical History and Review of Relevant Studies   Patient Active Problem List   Diagnosis Date Noted   Subjective memory complaints 09/06/2020   S/p reverse total shoulder arthroplasty 06/13/2017   Status post  total shoulder arthroplasty 04/18/2017   S/P lumbar spinal fusion 07/19/2016   S/P shoulder replacement 01/19/2016   Review of Neuroimaging and Relevant Medical History: The patient has no history of head injuries, strokes, seizures, or neurological surgeries.   Patient had brisk reflexes on exam, MRI of the C spine showed neuroforaminal stenosis but no compression that would affect balance of reflexes as per Dr. Jannifer Franklin' review.   I do not see actual images from the patient's MRI of brain, although the report from [facility] (06/2020) shows that cerebral volume is normal for age and similar in the interval since her last study on 02/17/2019. There was minimal multifocal T2/FLAIR hyperintensity within the cerebral white matter.   Current Outpatient Medications  Medication Sig Dispense Refill   acetaminophen (TYLENOL) 650 MG CR tablet Take 650-1,300 mg by mouth 3 (three) times daily as needed for pain.      diclofenac sodium (VOLTAREN) 1 % GEL Apply 1 application topically 3 (three) times daily as needed (joint pain).     L-LYSINE PO Take 0.5 tablets by mouth daily.      levothyroxine (SYNTHROID, LEVOTHROID) 50 MCG tablet Take 50 mcg by mouth daily before breakfast.      losartan (COZAAR) 25 MG tablet Take 25 mg by mouth once daily  0   Melatonin 10 MG TABS Take by mouth.     meloxicam (MOBIC) 15 MG tablet Take 1 tablet by mouth daily.     Omega-3 Fatty Acids (FISH OIL) 1200 MG CAPS Take 1,200 mg by mouth every evening.     pravastatin (PRAVACHOL) 40 MG tablet Take 40 mg by mouth at bedtime.      QC STOOL SOFTENER 100 MG capsule Take 100 mg by mouth 2 (two) times daily as needed.     Current Facility-Administered Medications  Medication Dose Route Frequency Provider Last Rate Last Admin   0.9 %  sodium chloride infusion  500 mL Intravenous Once Jackquline Denmark, MD       Family History  Problem Relation Age of Onset   Lung cancer Mother    Lung cancer Father    Alcoholism Father     Alcoholism Brother    Dementia Neg Hx    There is a family history of dementia. The patient has a half brother (father's side of the family) who developed dementia in his late 75s or 24s. There is no  family history of psychiatric illness. There is some alcoholism. Her daughter also has ADHD. The patient's daughter stated that at least one of her siblings also has ADHD.   Psychosocial History  Developmental, Educational and Employment History: The patient is a native of [place], [place]. The patient was a very good student who graduated 4th in her class. She got a full scholarship to college. She went to a bible college but also studied general subjects. She was working towards a 4 years degree but she left after 2 years. She stayed at home to raise  children. 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 She retired many years ago.    Psychiatric History: The patient stated that she previously had issues with depression but she never got any formal treatment. She thinks she has gotten over that. She is grieving since her husband passed, but she is not depressed about it.   Substance Use History: The patient does not drink, she has never smoked, she does not use any illicit substances.   Relationship History and Living Cimcumstances: The patient and her husband were married for nearly 38 years. He unfortunateyl passed about a month ago. She has 5 children who have also noticed changes and are involved in her care.   Mental Status and Behavioral Observations  Sensorium/Arousal: The patient's level of arousal was awake and alert. Hearing and vision were adequate for testing purposes. Orientation: The patient was oriented to person, place, generally to time (off on the specific date but otherwise good), and to sitaution.  Appearance: Dressed in appropriate, casual clothign Behavior: The patient was pleasant and  appropriate. During testing, she was persistent and she did fatigue towards the end but appeared to generally maintain a high level of focus and effort.  Speech/language: Normal in rate, rhythm, volume and prosody. She had occasional word finding pauses and paraphasic errors.  Gait/Posture: Gait was not formally examined, normal for Dr. Jaynee Eagles Movement: Patient had no discernible rest tremor, bradykinesia, masked facies or other concerning signs/symptoms Social Comportment: Pleasant, appropriate Mood: "I'm grieving but I'm not depressed."  Affect: Neutral Thought process/content: Thought process was logical and goal oriented. The patient was able to provide a reasonably detailed personal history and timeline. She seemed quite accurate in her appraisal of her symptoms. Thought content was appropriate, focused on the topics discussed.  Safety: No safety concerns identified within the present encounter.  Insight: [name] Cognitive Assessment  12/14/2020  Visuospatial/ Executive (0/5) 4  Naming (0/3) 3  Attention: Read list of digits (0/2) 2  Attention: Read list of letters (0/1) 0  Attention: Serial 7 subtraction starting at 100 (0/3) 3  Language: Repeat phrase (0/2) 2  Language : Fluency (0/1) 0  Abstraction (0/2) 2  Delayed Recall (0/5) 2  Orientation (0/6) 5  Total 23  Adjusted Score (based on education) 23   Test Procedures  Wide Range Achievement Test - 4             Word Reading Doy Mince' Intellectual Screening Test Neuropsychological Assessment Battery  List Learning  Story Learning  Daily Living Memory  Naming  Digit Span Repeatable Battery for the Assessment of Neuropsychological Status (Form A)  Figure Copy  Judgment of Line Orientation  Coding  Figure Recall The Dot Counting Test A Random Letter Test Controlled Oral Word Association (F-A-S) Semantic Fluency (Animals) Trail Making Test A & B Complex Ideational Material Modified [place] Card Sorting  Test Geriatric Depression Scale - Short Form Quick Dementia Rating System (completed by daughter, [name])  Plan  [name] was seen for a psychiatric diagnostic evaluation and neuropsychological testing. She is a 75 year old, right-hand dominant, widowed woman. Unfortunately, her husband passed about a month ago, although she is handling it well. We discussed whether testing today made sense earlier in the week, she wished to proceed. She and her daughter report mild memory problems, although she was also having fairly significant difficulties navigating. It sounds like these were isolated incidents and  she is doing better otherwise. She is scoring in the normal to MCI range today, I think that her testing will be helpful to better elucidate the nature of any decline and see if this is in fact an objective problem. Full and complete note with impressions, recommendations, and interpretation of test data to follow.   Viviano Simas Nicole Kindred, PsyD, [place] Clinical Neuropsychologist  Informed Consent  Risks and benefits of the evaluation were discussed with the patient prior to all testing procedures. I conducted a clinical interview and neuropsychological testing (at least two tests) with [name]. The patient was able to tolerate the testing procedures and the patient (and/or family if applicable) is likely to benefit from further follow up to receive the diagnosis and treatment recommendations, which will be rendered at the next encounter.

## 2020-12-15 ENCOUNTER — Encounter: Payer: Self-pay | Admitting: Counselor

## 2020-12-15 NOTE — Progress Notes (Signed)
[facility]  Patient Name: JMYA ULIANO MRN: [id number] Date of Birth: Jan 19, 1946 Age: 75 y.o. Education: 14 years  Measurement properties of test scores: IQ, Index, and Standard Scores (SS): Mean = 100; Standard Deviation = 15 Scaled Scores (Ss): Mean = 10; Standard Deviation = 3 Z scores (Z): Mean = 0; Standard Deviation = 1 T scores (T); Mean = 50; Standard Deviation = 10  TEST SCORES:    Note: This summary of test scores accompanies the interpretive report and should not be interpreted by unqualified individuals or in isolation without reference to the report. Test scores are relative to age, gender, and educational history as available and appropriate.   Performance Validity        "A" Random Letter Test Raw  Descriptor      Errors 0 Within Expectation  The Dot Counting Test: 7 Within Expectation  NAB Effort Index 0 Within Expectation      Mental Status Screening     Total Score Descriptor  MoCA 23 MCI      Expected Functioning        Wide Range Achievement Test: Standard/Scaled Score Percentile      Word Reading 112 79      Reynolds Intellectual Screening Test Standard/T-score Percentile      Guess What 59 82      Odd Item Out 58 79  RIST Index 116 86      Attention/Processing Speed        Neuropsychological Assessment Battery (Attention Module, Form 1): T-score Percentile      Digits Forward 57 75      Digits Backwards 62 88      Repeatable Battery for the Assessment of Neuropsychological Status (Form A): Scaled Score Percentile      Coding 11 63      Language        Neuropsychological Assessment Battery (Language Module, Form 1): T-score Percentile      Naming   (30) 58 79      Verbal Fluency: T-score Percentile      Controlled Oral Word Association (F-A-S) 31 3      Semantic Fluency (Animals) 43 25      Memory:        Neuropsychological Assessment Battery (Memory Module, Form 1): T-score Percentile       List Learning           List A Immediate Recall   (3, 5, 5) 31 3         List B Immediate Recall   (5) 55 69         List A Short Delayed Recall   (2) 27 1         List A Long Delayed Recall   (1) 27 1         List A Percent Retention   (50 %) --- 9         List A Long Delayed Yes/No Recognition Hits   (9) --- 3         List A Long Delayed Yes/No Recognition False Alarms   (4) --- 50         List A Recognition Discriminability Index --- 31      Story Learning           Immediate Recall   (11, 17) 25 1         Delayed Recall   (13) 30 2  Percent Retention   (76 %) --- 25      Daily Living Memory            Immediate Recall   (20, 16) 40 16          Delayed Recall   (2, 2) 19 <1          Percent Retention (31 %) --- <1          Recognition Hits    (9) --- 62      Repeatable Battery for the Assessment of Neuropsychological Status (Form A): Scaled Score Percentile         Figure Recall   (5) 5 5      Visuospatial/Constructional Functioning        Repeatable Battery for the Assessment of Neuropsychological Status (Form A): Standard/Scaled Score Percentile     Visuospatial/Constructional Index 116 86         Figure Copy   (19) 12 75         Judgment of Line Orientation   (19) --- >75      Executive Functioning        Modified [place] Card Sorting Test (MWCST): Standard/T-Score Percentile      Number of Categories Correct 59 82      Number of Perseverative Errors 50 50      Number of Total Errors 61 86      Percent Perseverative Errors 34 5  Executive Function Composite 107 68      Trail Making Test: T-Score Percentile      Part A 57 75      Part B 60 84      [place] Diagnostic Aphasia Exam: Raw Score Scaled Score      Complex Ideational Material 10 7      Clock Drawing Raw Score Descriptor      Command 10 WNL      Rating Scales        Clinical Dementia Rating Raw Score Descriptor      Sum of Boxes 1.5 MCI      Global Score 0.5 MCI      Quick Dementia Rating  System Raw Score Descriptor      Sum of Boxes 2.5 Very Mild Dementia      Total Score 4 MCI  Geriatric Depression Scale - Short Form 2 Negative   Bambie Pizzolato V. Nicole Kindred PsyD, [place] Clinical Neuropsychologist

## 2020-12-16 NOTE — Progress Notes (Signed)
[facility]  Patient Name: [name] MRN: [id number] Date of Birth: [birth year] Age: 75 y.o. Education: 63 years  Clinical Impressions  [name] is a 75 y.o., right-hand dominant, widowed (husband passed just 1 month ago) woman with a history of very subtle memory problems since Christmas, 2021. She got lost while driving around then and her family noticed the changes at that point. They perceive her changes as abrupt in onset although they have not been worsening markedly since they were noticed. They are noticing mainly memory problems. She had a visit with Dr. Jaynee Parks who demonstrated an MMSE of 29/30 and referred her for neuropsychological assessment. My thanks to Dr. Jaynee Parks for her characteristically detailed and thorough notes. I did not have any actual images but a report from an MRI of the brain shows involutional volume loss without any significant progression in the interval since her last scan in 2020 and minimal areas of leukoaraiosis.   [name] performance on neuropsychological assessment shows less than expected memory performance with a pattern concerning for storage problems. She did retain some information across time on and is benefiting from cuing but I think this is simply an early storage problem as opposed to an isolated retrieval issue or another cognitive problem affecting memory performance. She did well in all other areas without any concerning scores and in fact did quite well on measures of visuospatial/constructional functioning and on several challenging indicators of executive abilities. She screened negative for the presence of depression. Her daughter rated her as functioning at an MCI to very mild dementia level of function. Her CDR places her more in the MCI range, I do not think it is clear that she meets the functional impairment criterion for dementia.   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 An underlying condition (I.e., AD) is the primary differential consideration given her demographics, clinical history, and test profile. May be some contribution from stress, pain complaints etc. But I do not think they entirely explain her scores. I think it is reasonable for her daughter to be checking finances but I would be surprised if the patient needs much help in this regard yet. Likewise, I would not think she would have any significant difficulties driving with amnestic MCI, and the events they reported while driving were isolated incidents so I think this can simply be monitored. Antidementia medication could be considered although given that this is an MCI level problem, patient may benefit more from primary prevention as opposed to symptomatic treatments, which I will discuss with her.   Diagnostic Impressions: Amnestic mild cognitive impairment Loss of husband  Recommendations to be discussed with patient  Your performance and presentation on assessment today were consistent with less than expected memory performance. You did well in all other areas, and demonstrated high levels of prior ability, which is good. Nevertheless, in the context of your clinical history these findings are sufficient for a diagnosis of amnestic mild cognitive impairment (i.e., mild cognitive impairment with memory loss).  The major difference between mild cognitive impairment (MCI) and dementia is in severity and potential prognosis. Once someone reaches a level of severity adequate to be diagnosed with a dementia, there is usually progression over time, though this may be years. On the other hand, mild cognitive impairment, while a significant risk for dementia in future, does not always progress to dementia, and in some instances  stays the same or can even revert to normal. It is important to realize that if MCI is due to underlying  Alzheimer's disease, it will most likely progress to dementia eventually. The rate of conversion to Alzheimer's dementia from amnestic MCI is about 15% per year versus the general population risk of conversion of 2% per year.   In your case, I am concerned that your mild cognitive impairment could be due to an underlying condition (which would most likely be Alzheimer's disease), although we cannot know that for sure at this time.  I would encourage you to take solace in the fact that this is a mild problem and to initiate strategies that have been found to be potentially protective against future decline. One such strategy is the mind diet, which was shown in one study to equate to a difference in cognitive test performance equivalent to being 7 years younger as compared to eating a less healthy diet.  The Mind diet is not so much a specific diet as it is a set of recommendations for things that you should and should not eat.   Foods that are ENCOURAGED on the MIND Diet:  Green, leafy vegetables: Aim for six or more servings per week. This includes kale, spinach, cooked greens and salads.  All other vegetables: Try to eat another vegetable in addition to the green leafy vegetables at least once a day. It is best to choose non-starchy vegetables because they have a lot of nutrients with a low number of calories.  Berries: Eat berries at least twice a week. There is a plethora of research on strawberries, and other berries such as blueberries, raspberries and blackberries have also been found to have antioxidant and brain health benefits.  Nuts: Try to get five servings of nuts or more each week. The creators of the [place] don't specify what kind of nuts to consume, but it is probably best to vary the type of nuts you eat to obtain a variety of nutrients. Peanuts are a legume and do not fall into this category.  Olive oil: Use olive oil as your main cooking oil. There may be other heart-healthy  alternatives such as algae oil, though there is not yet sufficient research upon which to base a formal recommendation.  Whole grains: Aim for at least three servings daily. Choose minimally processed grains like oatmeal, quinoa, brown rice, whole-wheat pasta and 100% whole-wheat bread.  Fish: Eat fish at least once a week. It is best to choose fatty fish like salmon, sardines, trout, tuna and mackerel for their high amounts of omega-3 fatty acids.  Beans: Include beans in at least four meals every week. This includes all beans, lentils and soybeans.  Poultry: Try to eat chicken or [place] at least twice a week. Note that fried chicken is not encouraged on the MIND diet.  Wine: Aim for no more than one glass of alcohol daily. Both red and white wine may benefit the brain. However, much research has focused on the red wine compound resveratrol, which may help protect against Alzheimer's disease.  Foods that are DISCOURAGED on the MIND Diet: Butter and margarine: Try to eat less than 1 tablespoon (about 14 grams) daily. Instead, try using olive oil as your primary cooking fat, and dipping your bread in olive oil with herbs.  Cheese: The MIND diet recommends limiting your cheese consumption to less than once per week.  Red meat: Aim for no more than three servings each week. This includes  all beef, pork, lamb and products made from these meats.  [place] food: The MIND diet highly discourages fried food, especially the kind from fast-food restaurants. Limit your consumption to less than once per week.  Pastries and sweets: This includes most of the processed junk food and desserts you can think of. Ice cream, cookies, brownies, snack cakes, donuts, candy and more. Try to limit these to no more than four times a week.  Exercise is one of the best medicines for promoting health and maintaining cognitive fitness at all stages in life. Exercise probably has the largest documented effect on brain health and  performance of any lifestyle intervention. Studies have shown that even previously sedentary individuals who start exercising as late as age 33 show a significant survival benefit as compared to their non-exercising peers. In the [place], the current guidelines are for 30 minutes of moderate exercise per day, but increasing your activity level less than that may also be helpful. You do not have to get your 30 minutes of exercise in one shot and exercising for short periods of time spread throughout the day can be helpful. Go for several walks, learn to dance, or do something else you enjoy that gets your body moving. Of course, if you have an underlying medical condition or there is any question about whether it is safe for you to exercise, you should consult a medical treatment provider prior to beginning exercise.   Staying active mentally is crucially important. This can include formal types of brain training, such as cognitive rehabilitation, sudoku, crossword puzzles and the like, although the best thing is to stay active and engaged in your life. This includes having meaningful hobbies and interests that you pursue and especially cultivating satisfying social relationships, which are a form of cognitive stimulation and have been shown to be protective in and of themselves.    You can return for reevaluation in 18 to 24 months or as directed by Dr. Jaynee Parks for repeat evaluation.Of course you can come back sooner if clinically indicated.   Test Findings  Test scores are summarized in additional documentation associated with this encounter. Test scores are relative to age, gender, and educational history as available and appropriate. There were no concerns about performance validity as all findings fell within normal expectations.   General Intellectual Functioning/Achievement:  Performance on single word reading was high average. Her RIST index was also high average, with comparable high average  scores on the verbally and visually mediated subtests.  Attention and Processing Efficiency: Performance on indicators of attention and working memory was very good, with high average scores for digit repetition forward and backward. Timed number symbol coding was average. She scored at a high average level on simple numeric sequencing.  Language: Performance on language measures showed intact visual object confrontation naming. Generation of words in response to the category prompt "animals" was low average, whereas generation of words in response to letters F-A-S was unusually low.  Visuospatial Function: Performance on visuospatial/constructional indicators was very good, with a high average score on the overall index from the RBANS. Figure copy was average and judgment of angular line orientations was high average.  Learning and Memory: Indicators of learning and memory showed less than expected performance, particularly considering [name]'s high average presumed level of prior ability. The findings are concerning for storage problems, although she did retain some information across time and also still benefiting from recognition cueing.  In the verbal realm, she learned 3, 5,  and 5 words of a 12 item word list across 3 learning trials which is unusually low. This was followed by two words at short delayed recall and one word at long delayed recall, which is extremely low for both trials. She did benefit from recognition cueing, with a discriminability index of five words, which is average. Story learning was extremely low on immediate recall although in this case, she retained information well and had unusually low performance on delayed recall. Due to an administration error, the delay interval for this task was less than the standard interval, and that may be artificially inflating her performance. Memory for brief, daily living type information was low average on immediate recall and  extremely low on delayed recall with extremely low retention over time. Recognition for this information was average.  In the visual realm, delayed recall for modestly complex geometric figure was unusually low.  Executive Functions: Performance on executive indicators was generally within normal limits. She scored toward the upper aspect of the average range on the executive function composite of the modified [place] card sorting test. Alternating sequencing of numbers and letters of the alphabet was high average. Generation of words in response to letters F-A-S was unusually low, although that is an isolated low score most otherwise reasonable performances. Reasoning with verbal information was low average on the complex ideational material. Clock drawing was within normal limits, with good formation of the face, number placement, and correct hand placement.  Rating Scale(s): [name] screened negative for the presence of depression. As above, she recently lost her husband and is grieving, but she does not feel depressed per se. Her daughter Manuela Schwartz characterized her as functioning at a mild cognitive impairment to perhaps very mild dementia level. I was able to rate a CDR for her, and her sum of boxes score is 1.5, which is mild cognitive impairment  [name]. Julia Kindred, PsyD, ABN Clinical Neuropsychologist  Coding and Compliance  Billing below reflects technician time, my direct face-to-face time with the patient, time spent in test administration, and time spent in professional activities including but not limited to: neuropsychological test interpretation, integration of neuropsychological test data with clinical history, report preparation, treatment planning, care coordination, and review of diagnostically pertinent medical history or studies.   Services associated with this encounter: Clinical Interview [phone]) plus 160 minutes (96132/[id number]; Neuropsychological Evaluation by Professional)   150 minutes (96136/[id number]; Test Administration by Professional)

## 2020-12-20 ENCOUNTER — Telehealth: Payer: Self-pay | Admitting: Neurology

## 2020-12-20 NOTE — Telephone Encounter (Signed)
She has an appointment with Dr. Nicole Kindred tomorrow to review all her findings from the formal memory testing. He will explain all the results. So we need to schedule a follow up her at [facility] in 4-5 months with Ward Givens. Will you call and schedule that? thanks

## 2020-12-20 NOTE — Telephone Encounter (Signed)
Scheduled pt for 05/04/21 with Megan. She also wanted to let us know that she plans to call tomorrow and proceed with sleep study.

## 2020-12-21 ENCOUNTER — Other Ambulatory Visit: Payer: Self-pay

## 2020-12-21 ENCOUNTER — Ambulatory Visit (INDEPENDENT_AMBULATORY_CARE_PROVIDER_SITE_OTHER): Payer: Medicare Other | Admitting: Counselor

## 2020-12-21 ENCOUNTER — Encounter: Payer: Self-pay | Admitting: Counselor

## 2020-12-21 DIAGNOSIS — G3184 Mild cognitive impairment, so stated: Secondary | ICD-10-CM

## 2020-12-21 NOTE — Addendum Note (Signed)
Addended by: Alphonzo Severance on: 12/21/2020 04:24 PM   Modules accepted: Level of Service

## 2020-12-21 NOTE — Progress Notes (Signed)
   NEUROPSYCHOLOGY FEEDBACK NOTE [facility]  Feedback Note: I met with [name] to review the findings resulting from her neuropsychological evaluation. Since the last appointment, she has been about the same.Time was spent reviewing the impressions and recommendations that are detailed in the evaluation report. We discussed impression of amnestic MCI, as reflected in the patient instructions. I was candid with the patient that her test data, while certainly substantiating an impression of amnestic MCI, are not very impaired and there are many strengths. I was candid wit her that I am most concerned about prodromal Alzheimer's but also acknowledged that it is very early so that is not entirely clear. I took time to explain the findings and answer all the patient's questions. I encouraged [name] to contact me should she have any further questions or if further follow up is desired.   Current Medications and Medical History   Current Outpatient Medications  Medication Sig Dispense Refill   acetaminophen (TYLENOL) 650 MG CR tablet Take 650-1,300 mg by mouth 3 (three) times daily as needed for pain.      diclofenac sodium (VOLTAREN) 1 % GEL Apply 1 application topically 3 (three) times daily as needed (joint pain).     L-LYSINE PO Take 0.5 tablets by mouth daily.      levothyroxine (SYNTHROID, LEVOTHROID) 50 MCG tablet Take 50 mcg by mouth daily before breakfast.      losartan (COZAAR) 25 MG tablet Take 25 mg by mouth once daily  0   Melatonin 10 MG TABS Take by mouth.     meloxicam (MOBIC) 15 MG tablet Take 1 tablet by mouth daily.     Omega-3 Fatty Acids (FISH OIL) 1200 MG CAPS Take 1,200 mg by mouth every evening.     pravastatin (PRAVACHOL) 40 MG tablet Take 40 mg by mouth at bedtime.      QC STOOL SOFTENER 100 MG capsule Take 100 mg by mouth 2 (two) times daily as needed.     Current Facility-Administered Medications  Medication Dose Route Frequency Provider Last Rate Last  Admin   0.9 %  sodium chloride infusion  500 mL Intravenous Once Jackquline Denmark, MD        Patient Active Problem List   Diagnosis Date Noted   Subjective memory complaints 09/06/2020   S/p reverse total shoulder arthroplasty 06/13/2017   Status post total shoulder arthroplasty 04/18/2017   S/P lumbar spinal fusion 07/19/2016   S/P shoulder replacement 01/19/2016    Mental Status and Behavioral Observations  [name] presented on time to the present encounter and was alert and generally oriented. Speech was normal in rate, rhythm, volume, and prosody. Self-reported mood was "good, I feel really positive about this" and affect was euthymic. Thought process was logical and goal oriented and thought content was appropraite. There were no safety concerns identified at today's encounter, such as thoughts of harming self or others.   Plan  Feedback provided regarding the patient's neuropsychological evaluation. Provided her with the aMCI diagnosis and we discussed at length preventative strategies. We also spent a significant period of time discussing sleep hygiene, because that may be contributing and she is having sleep problems. [name] was encouraged to contact me if any questions arise or if further follow up is desired.   Viviano Simas Nicole Kindred, PsyD, ABN Clinical Neuropsychologist  Service(s) Provided at This Encounter: 52 minutes [phone]; Psychotherapy with patient/family)

## 2020-12-21 NOTE — Patient Instructions (Signed)
Your performance and presentation on assessment today were consistent with less than expected memory performance. You did well in all other areas, and demonstrated high levels of prior ability, which is good. Nevertheless, in the context of your clinical history these findings are sufficient for a diagnosis of amnestic mild cognitive impairment (i.e., mild cognitive impairment with memory loss).   The major difference between mild cognitive impairment (MCI) and dementia is in severity and potential prognosis. Once someone reaches a level of severity adequate to be diagnosed with a dementia, there is usually progression over time, though this may be years. On the other hand, mild cognitive impairment, while a significant risk for dementia in future, does not always progress to dementia, and in some instances stays the same or can even revert to normal. It is important to realize that if MCI is due to underlying Alzheimer's disease, it will most likely progress to dementia eventually. The rate of conversion to Alzheimer's dementia from amnestic MCI is about 15% per year versus the general population risk of conversion of 2% per year.    In your case, I am concerned that your mild cognitive impairment could be due to an underlying condition (which would most likely be Alzheimer's disease), although we cannot know that for sure at this time.  I would encourage you to take solace in the fact that this is a mild problem and to initiate strategies that have been found to be potentially protective against future decline. One such strategy is the mind diet, which was shown in one study to equate to a difference in cognitive test performance equivalent to being 7 years younger as compared to eating a less healthy diet.  The Mind diet is not so much a specific diet as it is a set of recommendations for things that you should and should not eat.   Foods that are ENCOURAGED on the MIND Diet:  Green, leafy vegetables:  Aim for six or more servings per week. This includes kale, spinach, cooked greens and salads.  All other vegetables: Try to eat another vegetable in addition to the green leafy vegetables at least once a day. It is best to choose non-starchy vegetables because they have a lot of nutrients with a low number of calories.  Berries: Eat berries at least twice a week. There is a plethora of research on strawberries, and other berries such as blueberries, raspberries and blackberries have also been found to have antioxidant and brain health benefits.  Nuts: Try to get five servings of nuts or more each week. The creators of the [place] don't specify what kind of nuts to consume, but it is probably best to vary the type of nuts you eat to obtain a variety of nutrients. Peanuts are a legume and do not fall into this category.  Olive oil: Use olive oil as your main cooking oil. There may be other heart-healthy alternatives such as algae oil, though there is not yet sufficient research upon which to base a formal recommendation.  Whole grains: Aim for at least three servings daily. Choose minimally processed grains like oatmeal, quinoa, brown rice, whole-wheat pasta and 100% whole-wheat bread.  Fish: Eat fish at least once a week. It is best to choose fatty fish like salmon, sardines, trout, tuna and mackerel for their high amounts of omega-3 fatty acids.  Beans: Include beans in at least four meals every week. This includes all beans, lentils and soybeans.  Poultry: Try to eat chicken or [place]  at least twice a week. Note that fried chicken is not encouraged on the MIND diet.  Wine: Aim for no more than one glass of alcohol daily. Both red and white wine may benefit the brain. However, much research has focused on the red wine compound resveratrol, which may help protect against Alzheimer's disease.  Foods that are DISCOURAGED on the MIND Diet: Butter and margarine: Try to eat less than 1 tablespoon (about 14  grams) daily. Instead, try using olive oil as your primary cooking fat, and dipping your bread in olive oil with herbs.  Cheese: The MIND diet recommends limiting your cheese consumption to less than once per week.  Red meat: Aim for no more than three servings each week. This includes all beef, pork, lamb and products made from these meats.  [place] food: The MIND diet highly discourages fried food, especially the kind from fast-food restaurants. Limit your consumption to less than once per week.  Pastries and sweets: This includes most of the processed junk food and desserts you can think of. Ice cream, cookies, brownies, snack cakes, donuts, candy and more. Try to limit these to no more than four times a week.   There are few things as disruptive to brain functioning as not getting a good night's sleep. For sleep, I recommend against using medications, which can have lingering sedating effects on the brain and rob your brain of restful REM sleep. Instead, consider trying some of the following sleep hygiene recommendations. They may not work at once and may take effort, but the effort you spend is likely to be rewarded with better sleep eventually:  Stick to a sleep schedule of the same bedtime and wake time even on the weekends, which can help to regulate your body's internal clock so that you fall asleep and stay asleep.  Practice a relaxing bedtime ritual (conducted away from bright lights) which will help separate your sleep from stimulating activities and prepare your body to fall asleep when you go to bed.  Avoid naps, especially in the afternoon.  Evaluate your room and create conditions that will promote sleep such as keeping it cool (between 60 - 67 degrees), quiet, and free from any lights. Consider using blackout curtains, a "white noise" generator, or fan that will help mask any noises that might prevent you from going to sleep or awaken you during the night.  Sleep on a comfortable mattress  and pillows.  Avoid bright light in the evening and excessive use of portable electronic devices right before bed that may contain light frequencies that can contribute to sleep problems.  Avoid alcohol, cigarettes, or heavy meals in the evening. If you must eat, consume a light snack 45 minutes before bed.  Use your bed only for sleep to strengthen the association between your bed and sleep.  If you can't go to sleep within 30 minutes, go into another room and do something relaxing until you feel tired. Then, come back and try to go to sleep again for 30 minutes and repeat until sleep is achieved.  Some people find over the counter melatonin to be helpful for sleep, which you could discuss with a pharmacist or prescribing provider.   Exercise is one of the best medicines for promoting health and maintaining cognitive fitness at all stages in life. Exercise probably has the largest documented effect on brain health and performance of any lifestyle intervention. Studies have shown that even previously sedentary individuals who start exercising as late as age  85 show a significant survival benefit as compared to their non-exercising peers. In the [place], the current guidelines are for 30 minutes of moderate exercise per day, but increasing your activity level less than that may also be helpful. You do not have to get your 30 minutes of exercise in one shot and exercising for short periods of time spread throughout the day can be helpful. Go for several walks, learn to dance, or do something else you enjoy that gets your body moving. Of course, if you have an underlying medical condition or there is any question about whether it is safe for you to exercise, you should consult a medical treatment provider prior to beginning exercise.    Staying active mentally is crucially important. This can include formal types of brain training, such as cognitive rehabilitation, sudoku, crossword puzzles and the like,  although the best thing is to stay active and engaged in your life. This includes having meaningful hobbies and interests that you pursue and especially cultivating satisfying social relationships, which are a form of cognitive stimulation and have been shown to be protective in and of themselves.     You can return for reevaluation in 18 to 24 months or as directed by Dr. Jaynee Eagles for repeat evaluation.Of course you can come back sooner if clinically indicated.

## 2021-01-20 DIAGNOSIS — M5136 Other intervertebral disc degeneration, lumbar region: Secondary | ICD-10-CM | POA: Diagnosis not present

## 2021-01-20 DIAGNOSIS — E039 Hypothyroidism, unspecified: Secondary | ICD-10-CM | POA: Diagnosis not present

## 2021-01-20 DIAGNOSIS — I10 Essential (primary) hypertension: Secondary | ICD-10-CM | POA: Diagnosis not present

## 2021-02-20 DIAGNOSIS — E039 Hypothyroidism, unspecified: Secondary | ICD-10-CM | POA: Diagnosis not present

## 2021-02-20 DIAGNOSIS — I10 Essential (primary) hypertension: Secondary | ICD-10-CM | POA: Diagnosis not present

## 2021-02-20 DIAGNOSIS — M5136 Other intervertebral disc degeneration, lumbar region: Secondary | ICD-10-CM | POA: Diagnosis not present

## 2021-02-22 DIAGNOSIS — R002 Palpitations: Secondary | ICD-10-CM | POA: Diagnosis not present

## 2021-02-22 DIAGNOSIS — Z1331 Encounter for screening for depression: Secondary | ICD-10-CM | POA: Diagnosis not present

## 2021-02-22 DIAGNOSIS — Z0000 Encounter for general adult medical examination without abnormal findings: Secondary | ICD-10-CM | POA: Diagnosis not present

## 2021-02-22 DIAGNOSIS — E039 Hypothyroidism, unspecified: Secondary | ICD-10-CM | POA: Diagnosis not present

## 2021-02-22 DIAGNOSIS — I10 Essential (primary) hypertension: Secondary | ICD-10-CM | POA: Diagnosis not present

## 2021-02-22 DIAGNOSIS — E7800 Pure hypercholesterolemia, unspecified: Secondary | ICD-10-CM | POA: Diagnosis not present

## 2021-03-28 DIAGNOSIS — Z1231 Encounter for screening mammogram for malignant neoplasm of breast: Secondary | ICD-10-CM | POA: Diagnosis not present

## 2021-05-01 DIAGNOSIS — Z5189 Encounter for other specified aftercare: Secondary | ICD-10-CM | POA: Diagnosis not present

## 2021-05-04 ENCOUNTER — Other Ambulatory Visit: Payer: Self-pay

## 2021-05-04 ENCOUNTER — Encounter: Payer: Self-pay | Admitting: Adult Health

## 2021-05-04 ENCOUNTER — Ambulatory Visit: Payer: Medicare Other | Admitting: Adult Health

## 2021-05-04 VITALS — BP 135/83 | HR 77 | Ht 61.0 in | Wt 187.6 lb

## 2021-05-04 DIAGNOSIS — G3184 Mild cognitive impairment, so stated: Secondary | ICD-10-CM | POA: Diagnosis not present

## 2021-05-04 NOTE — Patient Instructions (Signed)
Consider starting Aricept or namenda If your symptoms worsen or you develop new symptoms please let us know.

## 2021-05-04 NOTE — Progress Notes (Signed)
PATIENT: Leroy Libman DOB: Sep 14, 1945  REASON FOR VISIT: follow up HISTORY FROM: patient PRIMARY NEUROLOGIST: Dr. Jaynee Eagles  HISTORY OF PRESENT ILLNESS: Today 05/04/21  Ms. Scherrie Bateman is a 76 year old female with a history of mild cognitive impairment.  She returns today for follow-up.  She did have neuropsychological evaluation with Dr. Nicole Kindred.  I have evaluated his notes.  The patient currently lives at home alone.  Her husband passed away in 11/24/22 and she is going through the grieving process.  She is able to complete all ADLs independently.  Reports good appetite.  She still prepares meals.  She manages her own medications.  She is currently not on any memory medication.  She returns today for an evaluation.  HISTORY LAYANNA CHARO is a 76 y.o. female here as requested by Ernestene Kiel, MD for memory loss.  Past medical history thyroid disease, high blood pressure, degenerative disc disease, subjective memory loss, osteoarthritis, elevated cholesterol, depression, kidney stones.  I reviewed Dr. Noe Gens notes, patient complaining of memory issues, memory issues have not resolved, patient states that she is having to use a GPS to get places now, she is lived in [place] for 38 years and now having to use a GPS to find her way, her children have noticed that she is more forgetful, today she had to think about which drawer in her kitchen she needed to go to get something out, worsening for the last 2 to 3 months, before that she was having issues with recalling a person's name if she saw them out of context.     The last year or two she started noticing some memory changes, she is here with her daughter who also provides information. Around Christmas she developed nerve pain and she was taken to urgent care and she had an mri of her back and she had ruptured a disk, She was given Lyrica and after that she had sudden onset of increased memory problems, dramatic to her, she would walk into her  kitchen and she wouldn't know where her silverware was, she got lost going into town, she was using GPS to find places she was going to for years, she may have better days then others. She was vacicnated in 2/21 and last booster of covid 10/21. She is a sensitive person, she can be talking about something and tear up, she cries at commercials, she is very emotional. She has had a hx of depression and her christian faith has helped overcome that. Also balance issues. She lives with her husband, her husband is sick has cancer and there is stress and has been going on for 2 years. Her half brother had some sort of mental problem. Mother and father without dementia. Mom lived to 105s and father to 61s. She is not aware of any dementia.    Reviewed notes, labs and imaging from outside physicians, which showed:   I reviewed MRI of the brain report from [facility] radiology completed at [facility] June 21, 2020: Cerebral volume is normal for age, similar to prior MRI of 02/17/2019, there is minimal multifocal T2 flair hyperintensity within the cerebral white matter which is nonspecific but compatible with chronic small vessel ischemic disease, prominent perivascular space within the left midbrain, no cortical encephalomalacia identified, no acute infarct, no evidence of intracranial mass, no chronic intracranial blood products, no extra-axial fluid collection, no midline shift, partially empty sella Turcica, expected proximal artery flow voids.  Impression: No evidence of acute intracranial abnormality, no  evidence of acute or recent subacute infarction, stable noncontrast MRI appearance of the brain is compared to February 17, 2019, minimum chronic small vessel ischemic changes within the cerebral white matter.   Blood work collected June 15, 2020: CMP normal with BUN 11 and creatinine 0.79, CBC normal, B12 599.  Hemoglobin A1c 5.3.  TSH normal 3.68.  RPR nonreactive.    REVIEW OF SYSTEMS: Out of a  complete 14 system review of symptoms, the patient complains only of the following symptoms, and all other reviewed systems are negative.  ALLERGIES: No Known Allergies  HOME MEDICATIONS: Outpatient Medications Prior to Visit  Medication Sig Dispense Refill   acetaminophen (TYLENOL) 650 MG CR tablet Take 650-1,300 mg by mouth 3 (three) times daily as needed for pain.      diclofenac sodium (VOLTAREN) 1 % GEL Apply 1 application topically 3 (three) times daily as needed (joint pain).     L-LYSINE PO Take 0.5 tablets by mouth daily.      levothyroxine (SYNTHROID, LEVOTHROID) 50 MCG tablet Take 50 mcg by mouth daily before breakfast.      losartan (COZAAR) 25 MG tablet Take 25 mg by mouth once daily  0   Melatonin 10 MG TABS Take by mouth.     meloxicam (MOBIC) 15 MG tablet Take 1 tablet by mouth daily.     Omega-3 Fatty Acids (FISH OIL) 1200 MG CAPS Take 1,200 mg by mouth every evening.     pravastatin (PRAVACHOL) 40 MG tablet Take 40 mg by mouth at bedtime.      QC STOOL SOFTENER 100 MG capsule Take 100 mg by mouth 2 (two) times daily as needed.     Facility-Administered Medications Prior to Visit  Medication Dose Route Frequency Provider Last Rate Last Admin   0.9 %  sodium chloride infusion  500 mL Intravenous Once Jackquline Denmark, MD        PAST MEDICAL HISTORY: Past Medical History:  Diagnosis Date   Arthritis    Cataracts, bilateral    immature   Chronic back pain    spondylolisthesis   Complication of anesthesia    unable to get up due dizziness,nausea- stayed overnite   Constipation    takes Colace daily   Depression    but not on any meds   History of colon polyps    benign one time and precancerous another   History of colon polyps    History of kidney stones 12/2015   Hyperlipidemia    takes Pravastatin daily   Hypertension    takes Losartan daily   Hypothyroidism    takes Synthroid daily   Kidney stones    Numbness    tingling/right lower leg    OA  (osteoarthritis)    PONV (postoperative nausea and vomiting)    Shoulder arthritis    Right    PAST SURGICAL HISTORY: Past Surgical History:  Procedure Laterality Date   ABDOMINAL HYSTERECTOMY     ABDOMINAL HYSTERECTOMY  2012   bladder tack   BACK SURGERY     COLONOSCOPY  2014   Polyps   MAXIMUM ACCESS (MAS)POSTERIOR LUMBAR INTERBODY FUSION (PLIF) 1 LEVEL N/A 07/19/2016   Procedure: LUMBAR FOUR-FIVE MAXIMUM ACCESS (MAS) POSTERIOR LUMBAR INTERBODY FUSION (PLIF), INSTRUMENTED LUMBAR THREE-FIVE;  Surgeon: Eustace Moore, MD;  Location: [facility];  Service: Neurosurgery;  Laterality: N/A;   REVERSE SHOULDER ARTHROPLASTY Right 06/13/2017   Procedure: Conversion of right total shoulder arthroplasty to reverse shoulder arthroplasty;  Surgeon: Justice Britain, MD;  Location: [facility];  Service: Orthopedics;  Laterality: Right;   SHOULDER SURGERY Left 2004   arthroscopy   SHOULDER SURGERY Right 06/13/2017    Conversion of right total shoulder arthroplasty to reverse shoulder arthroplasty (Right Shoulder)   TOTAL SHOULDER ARTHROPLASTY Left 01/19/2016   Procedure: TOTAL SHOULDER ARTHROPLASTY;  Surgeon: Justice Britain, MD;  Location: [facility];  Service: Orthopedics;  Laterality: Left;   TOTAL SHOULDER ARTHROPLASTY Right 04/18/2017   Procedure: RIGHT TOTAL SHOULDER ARTHROPLASTY;  Surgeon: Justice Britain, MD;  Location: [facility];  Service: Orthopedics;  Laterality: Right;   TUBAL LIGATION  02/1983    FAMILY HISTORY: Family History  Problem Relation Age of Onset   Lung cancer Mother    Lung cancer Father    Alcoholism Father    Alcoholism Brother    Dementia Neg Hx     SOCIAL HISTORY: Social History   Socioeconomic History   Marital status: Widowed    Spouse name: Not on file   Number of children: Not on file   Years of education: Not on file   Highest education level: Not on file  Occupational History   Not on file  Tobacco Use   Smoking status: Never   Smokeless tobacco: Never  Vaping Use    Vaping Use: Never used  Substance and Sexual Activity   Alcohol use: Yes    Alcohol/week: 0.0 standard drinks    Comment: rarely   Drug use: No   Sexual activity: Not on file  Other Topics Concern   Not on file  Social History Narrative   Not on file   Social Determinants of Health   Financial Resource Strain: Not on file  Food Insecurity: Not on file  Transportation Needs: Not on file  Physical Activity: Not on file  Stress: Not on file  Social Connections: Not on file  Intimate Partner Violence: Not on file      PHYSICAL EXAM  Vitals:   05/04/21 1104  BP: 135/83  Pulse: 77  Weight: 187 lb 9.6 oz (85.1 kg)  Height: 5\' 1"  (1.549 m)   Body mass index is 35.45 kg/m.  MMSE - Mini Mental State Exam 05/04/2021 09/06/2020  Orientation to time 4 5  Orientation to Place 5 4  Registration 3 3  Attention/ Calculation 5 5  Recall 3 3  Language- name 2 objects 2 2  Language- repeat 0 1  Language- follow 3 step command 3 3  Language- read & follow direction 1 1  Write a sentence 1 1  Copy design 1 1  Total score 28 29     Generalized: Well developed, in no acute distress   Neurological examination  Mentation: Alert oriented to time, place, history taking. Follows all commands speech and language fluent Cranial nerve II-XII: Pupils were equal round reactive to light. Extraocular movements were full, visual field were full on confrontational test. Facial sensation and strength were normal.  Head turning and shoulder shrug  were normal and symmetric. Motor: The motor testing reveals 5 over 5 strength of all 4 extremities. Good symmetric motor tone is noted throughout.  Sensory: Sensory testing is intact to soft touch on all 4 extremities. No evidence of extinction is noted.  Coordination: Cerebellar testing reveals good finger-nose-finger and heel-to-shin bilaterally.  Gait and station: Gait is normal.  Reflexes: Deep tendon reflexes are symmetric and normal bilaterally.    DIAGNOSTIC DATA (LABS, IMAGING, TESTING) - I reviewed patient records, labs, notes, testing and imaging myself where available.  Lab Results  Component Value Date   WBC 7.3 06/13/2017   HGB 13.0 06/13/2017   HCT 40.1 06/13/2017   MCV 95.0 06/13/2017   PLT 293 06/13/2017      Component Value Date/Time   NA 139 06/13/2017 0630   K 3.9 06/13/2017 0630   CL 106 06/13/2017 0630   CO2 21 (L) 06/13/2017 0630   GLUCOSE 86 06/13/2017 0630   BUN 22 (H) 06/13/2017 0630   CREATININE 0.84 06/13/2017 0630   CALCIUM 9.3 06/13/2017 0630   GFRNONAA >60 06/13/2017 0630   GFRAA >60 06/13/2017 0630      ASSESSMENT AND PLAN 76 y.o. year old female  has a past medical history of Arthritis, Cataracts, bilateral, Chronic back pain, Complication of anesthesia, Constipation, Depression, History of colon polyps, History of colon polyps, History of kidney stones (12/2015), Hyperlipidemia, Hypertension, Hypothyroidism, Kidney stones, Numbness, OA (osteoarthritis), PONV (postoperative nausea and vomiting), and Shoulder arthritis. here with :  1.  Mild cognitive impairment  Memory score is stable MMSE 28/30 Discussed starting Aricept or Namenda.  They plan to look over the medications and let me know Follow-up in 6 months or sooner if needed     Ward Givens, MSN, NP-C 05/04/2021, 10:56 AM [facility] [address], [place],  [address] [phone]

## 2021-05-09 ENCOUNTER — Telehealth: Payer: Self-pay | Admitting: Adult Health

## 2021-05-09 NOTE — Telephone Encounter (Signed)
Pt is asking for a call back with the name of the 2 medications that were suggested for pt's memory, please call.

## 2021-05-09 NOTE — Telephone Encounter (Signed)
Returned pt's call at [phone] and LVM (ok per DPR) advising Megan NP had stated to consider starting Aricept or Namenda. Asked her to take a look at these, per [facility], and let us know. Left office number for call back if needed.

## 2021-05-17 NOTE — Telephone Encounter (Signed)
Patient called and said she decided on Aricept. She asked that if there is a generic brand for this she would prefer that for the cost. Also that this prescription get sent to [facility] on [address]. She does not need call back.

## 2021-05-18 ENCOUNTER — Other Ambulatory Visit: Payer: Self-pay | Admitting: Adult Health

## 2021-05-18 MED ORDER — DONEPEZIL HCL 5 MG PO TABS: 5.0000 mg | ORAL_TABLET | Freq: Every day | ORAL | 1 refills | Status: DC

## 2021-05-18 NOTE — Addendum Note (Signed)
Addended by: Brandon Melnick on: 05/18/2021 12:17 PM   Modules accepted: Orders

## 2021-05-18 NOTE — Telephone Encounter (Signed)
Spoke to pt and let her know the plan.  She was appreciative and verbalized understanding to call in 3 -4 wks let us know how she is doing after 5mg  po qhs (generic donepezil). then will increase to 10mg  if doing ok.

## 2021-05-18 NOTE — Telephone Encounter (Signed)
Okay to do Aricept 5 mg at bedtime after 4 weeks can call for dose increase

## 2021-08-24 DIAGNOSIS — E039 Hypothyroidism, unspecified: Secondary | ICD-10-CM | POA: Diagnosis not present

## 2021-08-24 DIAGNOSIS — B351 Tinea unguium: Secondary | ICD-10-CM | POA: Diagnosis not present

## 2021-08-24 DIAGNOSIS — Z79899 Other long term (current) drug therapy: Secondary | ICD-10-CM | POA: Diagnosis not present

## 2021-08-24 DIAGNOSIS — E7800 Pure hypercholesterolemia, unspecified: Secondary | ICD-10-CM | POA: Diagnosis not present

## 2021-08-24 DIAGNOSIS — I10 Essential (primary) hypertension: Secondary | ICD-10-CM | POA: Diagnosis not present

## 2021-09-05 ENCOUNTER — Other Ambulatory Visit: Payer: Self-pay | Admitting: Adult Health

## 2021-09-05 ENCOUNTER — Telehealth: Payer: Self-pay | Admitting: Adult Health

## 2021-09-05 MED ORDER — DONEPEZIL HCL 5 MG PO TABS: 5.0000 mg | ORAL_TABLET | Freq: Every morning | ORAL | 0 refills | Status: DC

## 2021-09-05 NOTE — Telephone Encounter (Signed)
Pt states that she is having trouble sleeping on the donepezil (ARICEPT) 5 MG tablet and she would like to know if she can try the Namenda that was mentioned to her in her last OV. Please advise.  ?

## 2021-09-05 NOTE — Telephone Encounter (Signed)
I called patient. I advised her to try aricept 5mg  in the AM instead of QHS. Patient is agreeable to this and needs a refill. I will do one month and she will let us know how it is going since [place], NP was considering an increase. Pt verbalized understanding. ? ?

## 2021-09-05 NOTE — Telephone Encounter (Signed)
Before she switch to another med. I would switch to take medication in the AM ?

## 2021-09-05 NOTE — Telephone Encounter (Signed)
I called patient.  She is waking up more during the night while taking Aricept 5 mg QHS.  She would rather switch to memantine, if that is okay with Jinny Blossom, NP.  She is using [facility] in [place]. ?

## 2021-09-20 DIAGNOSIS — I10 Essential (primary) hypertension: Secondary | ICD-10-CM | POA: Diagnosis not present

## 2021-09-20 DIAGNOSIS — G3184 Mild cognitive impairment, so stated: Secondary | ICD-10-CM | POA: Diagnosis not present

## 2021-09-20 DIAGNOSIS — E039 Hypothyroidism, unspecified: Secondary | ICD-10-CM | POA: Diagnosis not present

## 2021-10-02 ENCOUNTER — Other Ambulatory Visit: Payer: Self-pay | Admitting: Adult Health

## 2021-10-02 NOTE — Telephone Encounter (Signed)
Spoke with patient. She still notes markedly changed sleeping pattern even when taking pill in the AM. She doesn't recall what day she switched from night to morning dosing. Patient states she wakes 2-3 times more at night than she used to, very interrupted compared to normal. She used to wake up maybe once to use the restroom. Now she wakes up around 3-4 AM and it can take her awhile to go back to sleep. She doesn't think her memory is worse. She would like to know what is recommended and if she needs to stay on it longer she is ok with that. I let her know that Jinny Blossom NP is out of the office this afternoon but that a message would be sent to her to address tomorrow AM. Pt verbalized appreciation. Will hold on refill for now. She has about 4 tablets left.

## 2021-10-03 NOTE — Telephone Encounter (Signed)
Spoke to pt and relayed that per MM/NP ok to stop, and see if her sleep patterns return to normal.  If there is not correlation then will restart. Pt verbalized understanding.  Will let us now how it goes.

## 2021-10-03 NOTE — Telephone Encounter (Signed)
She could do trial off medication to see if sleep improves. If not then its not the medication causing it.

## 2021-10-17 ENCOUNTER — Telehealth: Payer: Self-pay | Admitting: Adult Health

## 2021-10-17 MED ORDER — DONEPEZIL HCL 5 MG PO TABS: 5.0000 mg | ORAL_TABLET | Freq: Every morning | ORAL | 0 refills | Status: DC

## 2021-10-17 NOTE — Telephone Encounter (Signed)
Pt is requesting a refill for donepezil (ARICEPT) 5 MG tablet .  Pharmacy:  [facility] [phone]

## 2021-10-17 NOTE — Telephone Encounter (Signed)
One refill sent. Pt has appt next month.

## 2021-10-18 ENCOUNTER — Other Ambulatory Visit: Payer: Self-pay | Admitting: Adult Health

## 2021-11-02 ENCOUNTER — Ambulatory Visit: Payer: Medicare Other | Admitting: Adult Health

## 2021-11-02 ENCOUNTER — Encounter: Payer: Self-pay | Admitting: Adult Health

## 2021-11-02 ENCOUNTER — Other Ambulatory Visit: Payer: Self-pay | Admitting: *Deleted

## 2021-11-02 VITALS — BP 161/90 | HR 93 | Ht 62.0 in | Wt 187.0 lb

## 2021-11-02 DIAGNOSIS — G3184 Mild cognitive impairment, so stated: Secondary | ICD-10-CM

## 2021-11-02 MED ORDER — DONEPEZIL HCL 5 MG PO TABS: 5.0000 mg | ORAL_TABLET | Freq: Every morning | ORAL | 3 refills | Status: DC

## 2021-11-02 NOTE — Progress Notes (Signed)
PATIENT: [name] DOB: 1945/11/12  REASON FOR VISIT: follow up Julia FROM: patient PRIMARY NEUROLOGIST: Dr. Jaynee Parks  Julia OF PRESENT ILLNESS: Today 11/02/21:  [name] is a 76 year old female with a Julia of Mild Cognitive Impairment. She Returns today for follow-up. She reports that memory is stable. Lives at home alone. Husband passed away last year. Still grieving the loss. Reports that she has a hard time talking about it still. Able to complete all ADLs independently. Manages own mediations and appointments.  Tries to stay involved in activities.  Considering getting counseling due to her grief.  Remains on Aricept 5 mg in the morning.  She feels that she is tolerating this well.  She does feel that it may have interfered with her sleep initially.    Has DDD has to take tylenol or aleve for discomfort. Mainly at night.   05/04/21: [name] is a 76 year old female with a Julia of mild cognitive impairment.  She returns today for follow-up.  She did have neuropsychological evaluation with Dr. Nicole Parks.  I have evaluated his notes.  The patient currently lives at home alone.  Her husband passed away in 11-15-22 and she is going through the grieving process.  She is able to complete all ADLs independently.  Reports good appetite.  She still prepares meals.  She manages her own medications.  She is currently not on any memory medication.  She returns today for an evaluation.  [name] is a 75 y.o. female here as requested by Julia Kiel, MD for memory loss.  Past medical Julia thyroid disease, high blood pressure, degenerative disc disease, subjective memory loss, osteoarthritis, elevated cholesterol, depression, kidney stones.  I reviewed Dr. Noe Parks notes, patient complaining of memory issues, memory issues have not resolved, patient states that she is having to use a GPS to get places now, she is lived in [place] for 38 years and now having to use a GPS  to find her way, her children have noticed that she is more forgetful, today she had to think about which drawer in her kitchen she needed to go to get something out, worsening for the last 2 to 3 months, before that she was having issues with recalling a person's name if she saw them out of context.     The last year or two she started noticing some memory changes, she is here with her daughter who also provides information. Around Christmas she developed nerve pain and she was taken to urgent care and she had an mri of her back and she had ruptured a disk, She was given Lyrica and after that she had sudden onset of increased memory problems, dramatic to her, she would walk into her kitchen and she wouldn't know where her silverware was, she got lost going into town, she was using GPS to find places she was going to for years, she may have better days then others. She was vacicnated in 2/21 and last booster of covid 10/21. She is a sensitive person, she can be talking about something and tear up, she cries at commercials, she is very emotional. She has had a hx of depression and her christian faith has helped overcome that. Also balance issues. She lives with her husband, her husband is sick has cancer and there is stress and has been going on for 2 years. Her half brother had some sort of mental problem. Mother and father without dementia. Mom lived to 64s and  father to 44s. She is not aware of any dementia.    Reviewed notes, labs and imaging from outside physicians, which showed:   I reviewed MRI of the brain report from [facility] radiology completed at [facility] June 21, 2020: Cerebral volume is normal for age, similar to prior MRI of 02/17/2019, there is minimal multifocal T2 flair hyperintensity within the cerebral white matter which is nonspecific but compatible with chronic small vessel ischemic disease, prominent perivascular space within the left midbrain, no cortical encephalomalacia  identified, no acute infarct, no evidence of intracranial mass, no chronic intracranial blood products, no extra-axial fluid collection, no midline shift, partially empty sella Turcica, expected proximal artery flow voids.  Impression: No evidence of acute intracranial abnormality, no evidence of acute or recent subacute infarction, stable noncontrast MRI appearance of the brain is compared to February 17, 2019, minimum chronic small vessel ischemic changes within the cerebral white matter.   Blood work collected June 15, 2020: CMP normal with BUN 11 and creatinine 0.79, CBC normal, B12 599.  Hemoglobin A1c 5.3.  TSH normal 3.68.  RPR nonreactive.    REVIEW OF SYSTEMS: Out of a complete 14 system review of symptoms, the patient complains only of the following symptoms, and all other reviewed systems are negative.  ALLERGIES: No Known Allergies  HOME MEDICATIONS: Outpatient Medications Prior to Visit  Medication Sig Dispense Refill   acetaminophen (TYLENOL) 650 MG CR tablet Take 650-1,300 mg by mouth 3 (three) times daily as needed for pain.      cholecalciferol (VITAMIN D3) 25 MCG (1000 UNIT) tablet Take 1,000 Units by mouth daily.     COLLAGEN PO Take by mouth. Takes daily     diclofenac sodium (VOLTAREN) 1 % GEL Apply 1 application topically 3 (three) times daily as needed (joint pain).     donepezil (ARICEPT) 5 MG tablet Take 1 tablet (5 mg total) by mouth in the morning. 30 tablet 0   L-LYSINE PO Take 0.5 tablets by mouth daily.      levothyroxine (SYNTHROID, LEVOTHROID) 50 MCG tablet Take 50 mcg by mouth daily before breakfast.      losartan (COZAAR) 25 MG tablet Take 25 mg by mouth once daily  0   magnesium 30 MG tablet Take 30 mg by mouth as needed.     Melatonin 10 MG TABS Take by mouth.     meloxicam (MOBIC) 15 MG tablet Take 1 tablet by mouth daily.     Omega-3 Fatty Acids (FISH OIL) 1200 MG CAPS Take 1,200 mg by mouth every evening.     pravastatin (PRAVACHOL) 40 MG tablet Take  40 mg by mouth at bedtime.      QC STOOL SOFTENER 100 MG capsule Take 100 mg by mouth 2 (two) times daily as needed.     Facility-Administered Medications Prior to Visit  Medication Dose Route Frequency Provider Last Rate Last Admin   0.9 %  sodium chloride infusion  500 mL Intravenous Once Jackquline Denmark, MD        PAST MEDICAL Julia: Past Medical Julia:  Diagnosis Date   Arthritis    Cataracts, bilateral    immature   Chronic back pain    spondylolisthesis   Complication of anesthesia    unable to get up due dizziness,nausea- stayed overnite   Constipation    takes Colace daily   Depression    but not on any meds   Julia of colon polyps    benign one time and precancerous  another   Julia of colon polyps    Julia of kidney stones 12/2015   Hyperlipidemia    takes Pravastatin daily   Hypertension    takes Losartan daily   Hypothyroidism    takes Synthroid daily   Kidney stones    Numbness    tingling/right lower leg    OA (osteoarthritis)    PONV (postoperative nausea and vomiting)    Shoulder arthritis    Right    PAST SURGICAL Julia: Past Surgical Julia:  Procedure Laterality Date   ABDOMINAL HYSTERECTOMY     ABDOMINAL HYSTERECTOMY  2012   bladder tack   BACK SURGERY     COLONOSCOPY  2014   Polyps   MAXIMUM ACCESS (MAS)POSTERIOR LUMBAR INTERBODY FUSION (PLIF) 1 LEVEL N/A 07/19/2016   Procedure: LUMBAR FOUR-FIVE MAXIMUM ACCESS (MAS) POSTERIOR LUMBAR INTERBODY FUSION (PLIF), INSTRUMENTED LUMBAR THREE-FIVE;  Surgeon: Eustace Moore, MD;  Location: [place];  Service: Neurosurgery;  Laterality: N/A;   REVERSE SHOULDER ARTHROPLASTY Right 06/13/2017   Procedure: Conversion of right total shoulder arthroplasty to reverse shoulder arthroplasty;  Surgeon: Justice Britain, MD;  Location: [place];  Service: Orthopedics;  Laterality: Right;   SHOULDER SURGERY Left 2004   arthroscopy   SHOULDER SURGERY Right 06/13/2017    Conversion of right total shoulder  arthroplasty to reverse shoulder arthroplasty (Right Shoulder)   TOTAL SHOULDER ARTHROPLASTY Left 01/19/2016   Procedure: TOTAL SHOULDER ARTHROPLASTY;  Surgeon: Justice Britain, MD;  Location: [place];  Service: Orthopedics;  Laterality: Left;   TOTAL SHOULDER ARTHROPLASTY Right 04/18/2017   Procedure: RIGHT TOTAL SHOULDER ARTHROPLASTY;  Surgeon: Justice Britain, MD;  Location: [place];  Service: Orthopedics;  Laterality: Right;   TUBAL LIGATION  02/1983    FAMILY Julia: Family Julia  Problem Relation Age of Onset   Lung cancer Mother    Lung cancer Father    Alcoholism Father    Alcoholism Brother    Skin cancer Daughter    Dementia Neg Hx     SOCIAL Julia: Social Julia   Socioeconomic Julia   Marital status: Widowed    Spouse name: Not on file   Number of children: Not on file   Years of education: Not on file   Highest education level: Not on file  Occupational Julia   Not on file  Tobacco Use   Smoking status: Never   Smokeless tobacco: Never  Vaping Use   Vaping Use: Never used  Substance and Sexual Activity   Alcohol use: Yes    Alcohol/week: 0.0 standard drinks of alcohol    Comment: rarely   Drug use: No   Sexual activity: Not on file  Other Topics Concern   Not on file  Social Julia Narrative   Not on file   Social Determinants of Health   Financial Resource Strain: Not on file  Food Insecurity: Not on file  Transportation Needs: Not on file  Physical Activity: Not on file  Stress: Not on file  Social Connections: Not on file  Intimate Partner Violence: Not on file      PHYSICAL EXAM  Vitals:   11/02/21 1030  BP: (!) 161/90  Pulse: 93  Weight: 187 lb (84.8 kg)  Height: 5\' 2"  (1.575 m)   Body mass index is 34.2 kg/m.     11/02/2021   10:32 AM 05/04/2021   11:06 AM 09/06/2020   10:43 AM  MMSE - Mini Mental State Exam  Orientation to time 5 4 5   Orientation to  Place 5 5 4   Registration 3 3 3   Attention/ Calculation 5 5 5   Recall  3 3 3   Language- name 2 objects 2 2 2   Language- repeat 1 0 1  Language- follow 3 step command 3 3 3   Language- read & follow direction 1 1 1   Write a sentence 1 1 1   Copy design 1 1 1   Total score 30 28 29      Generalized: Tearful when she speaks about her husband  Neurological examination  Mentation: Alert oriented to time, place, Julia taking. Follows all commands speech and language fluent Cranial nerve II-XII: Pupils were equal round reactive to light. Extraocular movements were full, visual field were full on confrontational test. Facial sensation and strength were normal.  Head turning and shoulder shrug  were normal and symmetric. Motor: The motor testing reveals 5 over 5 strength of all 4 extremities. Good symmetric motor tone is noted throughout.  Sensory: Sensory testing is intact to soft touch on all 4 extremities. No evidence of extinction is noted.  Coordination: Cerebellar testing reveals good finger-nose-finger and heel-to-shin bilaterally.  Gait and station: Gait is normal.    DIAGNOSTIC DATA (LABS, IMAGING, TESTING) - I reviewed patient records, labs, notes, testing and imaging myself where available.  Lab Results  Component Value Date   WBC 7.3 06/13/2017   HGB 13.0 06/13/2017   HCT 40.1 06/13/2017   MCV 95.0 06/13/2017   PLT 293 06/13/2017      Component Value Date/Time   NA 139 06/13/2017 0630   K 3.9 06/13/2017 0630   CL 106 06/13/2017 0630   CO2 21 (L) 06/13/2017 0630   GLUCOSE 86 06/13/2017 0630   BUN 22 (H) 06/13/2017 0630   CREATININE 0.84 06/13/2017 0630   CALCIUM 9.3 06/13/2017 0630   GFRNONAA >60 06/13/2017 0630   GFRAA >60 06/13/2017 0630      ASSESSMENT AND PLAN 76 y.o. year old female  has a past medical Julia of Arthritis, Cataracts, bilateral, Chronic back pain, Complication of anesthesia, Constipation, Depression, Julia of colon polyps, Julia of colon polyps, Julia of kidney stones (12/2015), Hyperlipidemia, Hypertension,  Hypothyroidism, Kidney stones, Numbness, OA (osteoarthritis), PONV (postoperative nausea and vomiting), and Shoulder arthritis. here with :  1.  Mild cognitive impairment  Memory score is stable MMSE 30/30 Keep regular follow-up with PCP Follow-up PRN   Ward Givens, MSN, NP-C 11/02/2021, 10:45 AM [facility] [address] [place], [place] [address] [phone]

## 2021-11-02 NOTE — Patient Instructions (Signed)
Your Plan:  Continue Aricept 5 mg daily If your symptoms worsen or you develop new symptoms please let us know.       Thank you for coming to see [place] at [facility]. I hope we have been able to provide you high quality care today.  You may receive a patient satisfaction survey over the next few weeks. We would appreciate your feedback and comments so that we may continue to improve ourselves and the health of our patients.

## 2021-11-07 ENCOUNTER — Other Ambulatory Visit: Payer: Self-pay | Admitting: Adult Health

## 2021-12-21 DIAGNOSIS — E039 Hypothyroidism, unspecified: Secondary | ICD-10-CM | POA: Diagnosis not present

## 2021-12-21 DIAGNOSIS — I10 Essential (primary) hypertension: Secondary | ICD-10-CM | POA: Diagnosis not present

## 2022-01-04 DIAGNOSIS — J189 Pneumonia, unspecified organism: Secondary | ICD-10-CM | POA: Diagnosis not present

## 2022-01-04 DIAGNOSIS — R051 Acute cough: Secondary | ICD-10-CM | POA: Diagnosis not present

## 2022-01-04 DIAGNOSIS — R918 Other nonspecific abnormal finding of lung field: Secondary | ICD-10-CM | POA: Diagnosis not present

## 2022-02-20 DIAGNOSIS — R918 Other nonspecific abnormal finding of lung field: Secondary | ICD-10-CM | POA: Diagnosis not present

## 2022-03-07 DIAGNOSIS — R059 Cough, unspecified: Secondary | ICD-10-CM | POA: Diagnosis not present

## 2022-03-07 DIAGNOSIS — R0602 Shortness of breath: Secondary | ICD-10-CM | POA: Diagnosis not present

## 2022-03-07 DIAGNOSIS — R918 Other nonspecific abnormal finding of lung field: Secondary | ICD-10-CM | POA: Diagnosis not present

## 2022-03-20 DIAGNOSIS — E7800 Pure hypercholesterolemia, unspecified: Secondary | ICD-10-CM | POA: Diagnosis not present

## 2022-03-20 DIAGNOSIS — Z79899 Other long term (current) drug therapy: Secondary | ICD-10-CM | POA: Diagnosis not present

## 2022-03-20 DIAGNOSIS — Z1331 Encounter for screening for depression: Secondary | ICD-10-CM | POA: Diagnosis not present

## 2022-03-20 DIAGNOSIS — Z0000 Encounter for general adult medical examination without abnormal findings: Secondary | ICD-10-CM | POA: Diagnosis not present

## 2022-03-20 DIAGNOSIS — Z6832 Body mass index (BMI) 32.0-32.9, adult: Secondary | ICD-10-CM | POA: Diagnosis not present

## 2022-03-20 DIAGNOSIS — I10 Essential (primary) hypertension: Secondary | ICD-10-CM | POA: Diagnosis not present

## 2022-03-20 DIAGNOSIS — E039 Hypothyroidism, unspecified: Secondary | ICD-10-CM | POA: Diagnosis not present

## 2022-03-20 DIAGNOSIS — Z1231 Encounter for screening mammogram for malignant neoplasm of breast: Secondary | ICD-10-CM | POA: Diagnosis not present

## 2022-03-21 DIAGNOSIS — M4312 Spondylolisthesis, cervical region: Secondary | ICD-10-CM | POA: Diagnosis not present

## 2022-03-21 DIAGNOSIS — M47812 Spondylosis without myelopathy or radiculopathy, cervical region: Secondary | ICD-10-CM | POA: Diagnosis not present

## 2022-03-21 DIAGNOSIS — M542 Cervicalgia: Secondary | ICD-10-CM | POA: Diagnosis not present

## 2022-04-10 DIAGNOSIS — R918 Other nonspecific abnormal finding of lung field: Secondary | ICD-10-CM | POA: Diagnosis not present

## 2022-04-10 DIAGNOSIS — I2510 Atherosclerotic heart disease of native coronary artery without angina pectoris: Secondary | ICD-10-CM | POA: Diagnosis not present

## 2022-04-11 DIAGNOSIS — Z6832 Body mass index (BMI) 32.0-32.9, adult: Secondary | ICD-10-CM | POA: Diagnosis not present

## 2022-04-11 DIAGNOSIS — E039 Hypothyroidism, unspecified: Secondary | ICD-10-CM | POA: Diagnosis not present

## 2022-04-11 DIAGNOSIS — R911 Solitary pulmonary nodule: Secondary | ICD-10-CM | POA: Diagnosis not present

## 2022-04-11 DIAGNOSIS — M5030 Other cervical disc degeneration, unspecified cervical region: Secondary | ICD-10-CM | POA: Diagnosis not present

## 2022-04-20 DIAGNOSIS — R918 Other nonspecific abnormal finding of lung field: Secondary | ICD-10-CM | POA: Diagnosis not present

## 2022-04-20 DIAGNOSIS — Z79899 Other long term (current) drug therapy: Secondary | ICD-10-CM | POA: Diagnosis not present

## 2022-04-20 DIAGNOSIS — R7309 Other abnormal glucose: Secondary | ICD-10-CM | POA: Diagnosis not present

## 2022-04-25 DIAGNOSIS — I2510 Atherosclerotic heart disease of native coronary artery without angina pectoris: Secondary | ICD-10-CM | POA: Diagnosis not present

## 2022-04-25 DIAGNOSIS — M542 Cervicalgia: Secondary | ICD-10-CM | POA: Diagnosis not present

## 2022-04-25 DIAGNOSIS — R918 Other nonspecific abnormal finding of lung field: Secondary | ICD-10-CM | POA: Diagnosis not present

## 2022-04-27 DIAGNOSIS — Z1231 Encounter for screening mammogram for malignant neoplasm of breast: Secondary | ICD-10-CM | POA: Diagnosis not present

## 2022-04-30 DIAGNOSIS — M542 Cervicalgia: Secondary | ICD-10-CM | POA: Diagnosis not present

## 2022-05-02 DIAGNOSIS — R911 Solitary pulmonary nodule: Secondary | ICD-10-CM | POA: Diagnosis not present

## 2022-05-02 DIAGNOSIS — I10 Essential (primary) hypertension: Secondary | ICD-10-CM | POA: Diagnosis not present

## 2022-05-02 DIAGNOSIS — Z79899 Other long term (current) drug therapy: Secondary | ICD-10-CM | POA: Diagnosis not present

## 2022-05-02 DIAGNOSIS — E039 Hypothyroidism, unspecified: Secondary | ICD-10-CM | POA: Diagnosis not present

## 2022-05-02 DIAGNOSIS — E7800 Pure hypercholesterolemia, unspecified: Secondary | ICD-10-CM | POA: Diagnosis not present

## 2022-05-03 DIAGNOSIS — R918 Other nonspecific abnormal finding of lung field: Secondary | ICD-10-CM | POA: Diagnosis not present

## 2022-05-03 DIAGNOSIS — M542 Cervicalgia: Secondary | ICD-10-CM | POA: Diagnosis not present

## 2022-05-09 DIAGNOSIS — E039 Hypothyroidism, unspecified: Secondary | ICD-10-CM | POA: Diagnosis not present

## 2022-05-09 DIAGNOSIS — R911 Solitary pulmonary nodule: Secondary | ICD-10-CM | POA: Diagnosis not present

## 2022-05-09 DIAGNOSIS — Z79890 Hormone replacement therapy (postmenopausal): Secondary | ICD-10-CM | POA: Diagnosis not present

## 2022-05-09 DIAGNOSIS — J9811 Atelectasis: Secondary | ICD-10-CM | POA: Diagnosis not present

## 2022-05-09 DIAGNOSIS — E785 Hyperlipidemia, unspecified: Secondary | ICD-10-CM | POA: Diagnosis not present

## 2022-05-09 DIAGNOSIS — Z79899 Other long term (current) drug therapy: Secondary | ICD-10-CM | POA: Diagnosis not present

## 2022-05-09 DIAGNOSIS — I10 Essential (primary) hypertension: Secondary | ICD-10-CM | POA: Diagnosis not present

## 2022-05-09 DIAGNOSIS — J984 Other disorders of lung: Secondary | ICD-10-CM | POA: Diagnosis not present

## 2022-05-09 DIAGNOSIS — R918 Other nonspecific abnormal finding of lung field: Secondary | ICD-10-CM | POA: Diagnosis not present

## 2022-05-15 DIAGNOSIS — I2510 Atherosclerotic heart disease of native coronary artery without angina pectoris: Secondary | ICD-10-CM | POA: Diagnosis not present

## 2022-05-15 DIAGNOSIS — R918 Other nonspecific abnormal finding of lung field: Secondary | ICD-10-CM | POA: Diagnosis not present

## 2022-05-15 DIAGNOSIS — R5383 Other fatigue: Secondary | ICD-10-CM | POA: Diagnosis not present

## 2022-06-11 DIAGNOSIS — R198 Other specified symptoms and signs involving the digestive system and abdomen: Secondary | ICD-10-CM | POA: Diagnosis not present

## 2022-06-11 DIAGNOSIS — Z9889 Other specified postprocedural states: Secondary | ICD-10-CM | POA: Diagnosis not present

## 2022-06-11 DIAGNOSIS — Z7901 Long term (current) use of anticoagulants: Secondary | ICD-10-CM | POA: Diagnosis not present

## 2022-06-11 DIAGNOSIS — R918 Other nonspecific abnormal finding of lung field: Secondary | ICD-10-CM | POA: Diagnosis not present

## 2022-06-11 DIAGNOSIS — Z79899 Other long term (current) drug therapy: Secondary | ICD-10-CM | POA: Diagnosis not present

## 2022-06-18 DIAGNOSIS — R5383 Other fatigue: Secondary | ICD-10-CM | POA: Diagnosis not present

## 2022-06-18 DIAGNOSIS — I2510 Atherosclerotic heart disease of native coronary artery without angina pectoris: Secondary | ICD-10-CM | POA: Diagnosis not present

## 2022-06-18 DIAGNOSIS — R918 Other nonspecific abnormal finding of lung field: Secondary | ICD-10-CM | POA: Diagnosis not present

## 2022-06-19 ENCOUNTER — Other Ambulatory Visit: Payer: Self-pay

## 2022-06-19 ENCOUNTER — Inpatient Hospital Stay: Payer: Medicare Other | Attending: Oncology | Admitting: Oncology

## 2022-06-19 ENCOUNTER — Ambulatory Visit: Payer: Medicare Other | Admitting: Oncology

## 2022-06-19 ENCOUNTER — Other Ambulatory Visit: Payer: Medicare Other

## 2022-06-19 ENCOUNTER — Encounter: Payer: Self-pay | Admitting: Oncology

## 2022-06-19 VITALS — BP 125/89 | HR 97 | Temp 98.0°F | Resp 16 | Ht 64.0 in | Wt 191.4 lb

## 2022-06-19 DIAGNOSIS — Z808 Family history of malignant neoplasm of other organs or systems: Secondary | ICD-10-CM

## 2022-06-19 DIAGNOSIS — C3490 Malignant neoplasm of unspecified part of unspecified bronchus or lung: Secondary | ICD-10-CM | POA: Insufficient documentation

## 2022-06-19 DIAGNOSIS — C3431 Malignant neoplasm of lower lobe, right bronchus or lung: Secondary | ICD-10-CM | POA: Diagnosis not present

## 2022-06-19 DIAGNOSIS — Z801 Family history of malignant neoplasm of trachea, bronchus and lung: Secondary | ICD-10-CM

## 2022-06-19 DIAGNOSIS — Z789 Other specified health status: Secondary | ICD-10-CM

## 2022-06-19 NOTE — Progress Notes (Signed)
[facility] Initial Visit:  Patient Care Team: Ernestene Kiel, MD as PCP - General (Internal Medicine)  CHIEF COMPLAINTS/PURPOSE OF CONSULTATION:  Oncology History  Lung cancer ([place])  06/19/2022 Initial Diagnosis   Lung cancer ([place])   06/19/2022 Cancer Staging   Staging form: Lung, AJCC 8th Edition - Clinical stage from 06/19/2022: Stage IA3 (cT1c, cN0, cM0) - Signed by Barbee Cough, MD on 06/19/2022 Histopathologic type: Adenocarcinoma, NOS Stage prefix: Initial diagnosis Histologic grade (G): G1 Histologic grading system: 4 grade system     HISTORY OF PRESENTING ILLNESS: [name] 77 y.o. female is here because of lung cancer Medical history notable for arthritis, cataracts, chronic back pain, colon polyps, hypertension, nephrolithiasis, neuropathy, shoulder arthroplasty, mild cognitive impairment  March 07, 2022: SOB and with cough.  CT PA.  Underlying mild emphysematous changes. Spiculated appearing 2.9 x 2.7 cm mass in the superior segment of the right  lower lobe is worrisome for neoplasm. No other pulmonary lesions or pulmonary nodules. No pleural effusions.   April 20, 2022: PET/CT.  3 cm right lower lobe pulmonary mass (SUV 7.4).  No hypermetabolic mediastinal or hilar adenopathy.  Subtle FDG uptake in both adrenal glands without underlying nodule or mass on CT imaging as such considered indeterminate.  May 03, 2022: PFTs.  FEV1 158% FVC 150% FEV1/FVC 104%.  DLCO 88.2%  May 09, 2022: Bronchoscopy with lavage, cytology and biopsy nondiagnostic  June 11, 2022: CT-guided biopsy right lower lobe lung mass.  Adenocarcinoma well-differentiated.  IHC demonstrate cytokeratin 7 and TT F1 positivity  June 19 2022:  [place]  Patient feels well.  She is able to do pretty much what she wants to do.  Is a bit fatigued.    Social:  Widowed.  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  Lives by self.  No  history of tobacco use.  EtOH none.     Review of Systems  Constitutional:  Negative for appetite change, chills, fatigue, fever and unexpected weight change.  HENT:   Negative for lump/mass, mouth sores, nosebleeds, sore throat, tinnitus, trouble swallowing and voice change.   Eyes:  Negative for eye problems and icterus.       Vision changes:  None  Respiratory:  Negative for chest tightness, hemoptysis, shortness of breath and wheezing.        Slight cough  Cardiovascular:  Negative for chest pain, leg swelling and palpitations.       PND:  none Orthopnea:  none  Gastrointestinal:  Negative for abdominal pain, blood in stool, constipation, diarrhea, nausea and vomiting.  Endocrine: Negative for hot flashes.       Cold intolerance:  none Heat intolerance:  none  Genitourinary:  Negative for bladder incontinence, difficulty urinating, dysuria, frequency, hematuria and nocturia.   Musculoskeletal:  Positive for arthralgias and back pain. Negative for gait problem, myalgias, neck pain and neck stiffness.  Skin:  Negative for itching, rash and wound.  Neurological:  Negative for dizziness, extremity weakness, gait problem, headaches, light-headedness, numbness, seizures and speech difficulty.  Hematological:  Negative for adenopathy. Does not bruise/bleed easily.  Psychiatric/Behavioral:  Negative for sleep disturbance and suicidal ideas. The patient is not nervous/anxious.     MEDICAL HISTORY: Past Medical History:  Diagnosis Date   Arthritis    Cataracts, bilateral    immature   Chronic back pain    spondylolisthesis   Complication of anesthesia    unable to get up due dizziness,nausea-  stayed overnite   Constipation    takes Colace daily   Depression    but not on any meds   History of colon polyps    benign one time and precancerous another   History of colon polyps    History of kidney stones 12/2015   Hyperlipidemia    takes Pravastatin daily   Hypertension    takes  Losartan daily   Hypothyroidism    takes Synthroid daily   Kidney stones    Numbness    tingling/right lower leg    OA (osteoarthritis)    PONV (postoperative nausea and vomiting)    Shoulder arthritis    Right    SURGICAL HISTORY: Past Surgical History:  Procedure Laterality Date   ABDOMINAL HYSTERECTOMY  2012   bladder tack   BACK SURGERY     BRONCHOSCOPY     COLONOSCOPY  2014   Polyps   LUNG BIOPSY     MAXIMUM ACCESS (MAS)POSTERIOR LUMBAR INTERBODY FUSION (PLIF) 1 LEVEL N/A 07/19/2016   Procedure: LUMBAR FOUR-FIVE MAXIMUM ACCESS (MAS) POSTERIOR LUMBAR INTERBODY FUSION (PLIF), INSTRUMENTED LUMBAR THREE-FIVE;  Surgeon: Eustace Moore, MD;  Location: [place];  Service: Neurosurgery;  Laterality: N/A;   REVERSE SHOULDER ARTHROPLASTY Right 06/13/2017   Procedure: Conversion of right total shoulder arthroplasty to reverse shoulder arthroplasty;  Surgeon: Justice Britain, MD;  Location: [place];  Service: Orthopedics;  Laterality: Right;   SHOULDER SURGERY Left 2004   arthroscopy   SHOULDER SURGERY Right 06/13/2017    Conversion of right total shoulder arthroplasty to reverse shoulder arthroplasty (Right Shoulder)   TOTAL SHOULDER ARTHROPLASTY Left 01/19/2016   Procedure: TOTAL SHOULDER ARTHROPLASTY;  Surgeon: Justice Britain, MD;  Location: [place];  Service: Orthopedics;  Laterality: Left;   TOTAL SHOULDER ARTHROPLASTY Right 04/18/2017   Procedure: RIGHT TOTAL SHOULDER ARTHROPLASTY;  Surgeon: Justice Britain, MD;  Location: [place];  Service: Orthopedics;  Laterality: Right;   TUBAL LIGATION  02/1983    SOCIAL HISTORY: Social History   Socioeconomic History   Marital status: Widowed    Spouse name: Not on file   Number of children: Not on file   Years of education: Not on file   Highest education level: Not on file  Occupational History   Not on file  Tobacco Use   Smoking status: Never    Passive exposure: Past   Smokeless tobacco: Never  Vaping Use   Vaping Use: Never used   Substance and Sexual Activity   Alcohol use: Yes    Alcohol/week: 0.0 standard drinks of alcohol    Comment: rarely   Drug use: No   Sexual activity: Not on file  Other Topics Concern   Not on file  Social History Narrative   Not on file   Social Determinants of Health   Financial Resource Strain: Not on file  Food Insecurity: No Food Insecurity (06/19/2022)   Hunger Vital Sign    Worried About Running Out of Food in the Last Year: Never true    Ran Out of Food in the Last Year: Never true  Transportation Needs: No Transportation Needs (06/19/2022)   PRAPARE - Hydrologist (Medical): No    Lack of Transportation (Non-Medical): No  Physical Activity: Not on file  Stress: Not on file  Social Connections: Not on file  Intimate Partner Violence: Not At Risk (06/19/2022)   Humiliation, Afraid, Rape, and Kick questionnaire    Fear of Current or Ex-Partner: No  Emotionally Abused: No    Physically Abused: No    Sexually Abused: No    FAMILY HISTORY Family History  Problem Relation Age of Onset   Lung cancer Mother    Lung cancer Father    Alcoholism Father    Alcoholism Brother    Skin cancer Daughter    Memory loss Half-Brother    Dementia Neg Hx     ALLERGIES:  has No Known Allergies.  MEDICATIONS:  Current Outpatient Medications  Medication Sig Dispense Refill   acetaminophen (TYLENOL) 650 MG CR tablet Take 650-1,300 mg by mouth 3 (three) times daily as needed for pain.      Calcium-Vitamin D-Vitamin K 650-12.5-40 MG-MCG-MCG CHEW Chew by mouth.     cholecalciferol (VITAMIN D3) 25 MCG (1000 UNIT) tablet Take 1,000 Units by mouth daily.     COLLAGEN PO Take by mouth. Takes daily     Cyanocobalamin (B-12 PO) Take by mouth.     diclofenac sodium (VOLTAREN) 1 % GEL Apply 1 application topically 3 (three) times daily as needed (joint pain).     donepezil (ARICEPT) 5 MG tablet Take 1 tablet (5 mg total) by mouth in the morning. 90 tablet 3    L-LYSINE PO Take 0.5 tablets by mouth daily.      levothyroxine (SYNTHROID, LEVOTHROID) 50 MCG tablet Take 50 mcg by mouth daily before breakfast.      losartan (COZAAR) 25 MG tablet Take 25 mg by mouth once daily  0   magnesium 30 MG tablet Take 30 mg by mouth as needed.     Melatonin 10 MG TABS Take by mouth.     meloxicam (MOBIC) 15 MG tablet Take 1 tablet by mouth daily.     Omega-3 Fatty Acids (FISH OIL) 1200 MG CAPS Take 1,200 mg by mouth every evening.     pravastatin (PRAVACHOL) 40 MG tablet Take 40 mg by mouth at bedtime.      QC STOOL SOFTENER 100 MG capsule Take 100 mg by mouth 2 (two) times daily as needed.     Current Facility-Administered Medications  Medication Dose Route Frequency Provider Last Rate Last Admin   0.9 %  sodium chloride infusion  500 mL Intravenous Once Jackquline Denmark, MD        PHYSICAL EXAMINATION:  ECOG PERFORMANCE STATUS: 0 - Asymptomatic   Vitals:   06/19/22 1558 06/19/22 1605  BP: (!) 155/90 125/89  Pulse: 97   Resp: 16   Temp: 98 F (36.7 C)   SpO2: 98%     Filed Weights   06/19/22 1558  Weight: 191 lb 6.4 oz (86.8 kg)     Physical Exam Vitals and nursing note reviewed.  Constitutional:      General: She is not in acute distress.    Appearance: Normal appearance. She is normal weight. She is not ill-appearing, toxic-appearing or diaphoretic.     Comments: Here with daughter.  Comfortable  HENT:     Head: Normocephalic and atraumatic.     Right Ear: External ear normal.     Left Ear: External ear normal.     Nose: Nose normal. No congestion or rhinorrhea.  Eyes:     General: No scleral icterus.    Extraocular Movements: Extraocular movements intact.     Conjunctiva/sclera: Conjunctivae normal.     Pupils: Pupils are equal, round, and reactive to light.  Cardiovascular:     Rate and Rhythm: Normal rate and regular rhythm.     Heart sounds:  No murmur heard.    No friction rub. No gallop.  Pulmonary:     Effort: Pulmonary  effort is normal. No respiratory distress.     Breath sounds: No stridor. Wheezing present. No rhonchi.  Abdominal:     General: Bowel sounds are normal.     Palpations: Abdomen is soft.     Tenderness: There is no abdominal tenderness. There is no guarding or rebound.  Musculoskeletal:        General: No swelling, tenderness or deformity.     Cervical back: Normal range of motion and neck supple. No rigidity or tenderness.     Right lower leg: No edema.     Left lower leg: No edema.  Lymphadenopathy:     Head:     Right side of head: No submental, submandibular, tonsillar, preauricular, posterior auricular or occipital adenopathy.     Left side of head: No submental, submandibular, tonsillar, preauricular, posterior auricular or occipital adenopathy.     Cervical: No cervical adenopathy.     Right cervical: No superficial, deep or posterior cervical adenopathy.    Left cervical: No superficial, deep or posterior cervical adenopathy.     Upper Body:     Right upper body: No supraclavicular, axillary, pectoral or epitrochlear adenopathy.     Left upper body: No supraclavicular, axillary, pectoral or epitrochlear adenopathy.  Skin:    General: Skin is warm.     Coloration: Skin is not jaundiced or pale.     Findings: No bruising or erythema.  Neurological:     General: No focal deficit present.     Mental Status: She is alert and oriented to person, place, and time. Mental status is at baseline.     Cranial Nerves: No cranial nerve deficit.     Motor: No weakness.     Gait: Gait normal.  Psychiatric:        Mood and Affect: Mood normal.        Behavior: Behavior normal.        Thought Content: Thought content normal.        Judgment: Judgment normal.      LABORATORY DATA: I have personally reviewed the data as listed:  No visits with results within 1 Month(s) from this visit.  Latest known visit with results is:  Office Visit on 09/06/2020  Component Date Value Ref Range  Status   Homocysteine 09/06/2020 11.2  0.0 - 19.2 umol/L Final   Thiamine 09/06/2020 200.1 (H)  66.5 - 200.0 nmol/L Final    RADIOGRAPHIC STUDIES: I have personally reviewed the radiological images as listed and agree with the findings in the report  No results found.  ASSESSMENT/PLAN 77 y.o. nonsmoking female with newly diagnosed lung cancer.  Medical history notable for arthritis, cataracts, chronic back pain, colon polyps, hypertension, nephrolithiasis, neuropathy, shoulder arthroplasty, mild cognitive impairment  Adenocarcinoma, Right lung, lower lobe, Stage 1A3 (T1c N0 M0):    March 07 2022:  CT PA demonstrated 2.9 cm spiculated mass superior segment RLL April 20 2022:  PET CT showed 3 cm RLL mass (SUV 7.4).  No hypermetabolic mediastinal or hilar adenopathy. Subtle FDG uptake in both adrenal glands without underlying nodule or mass on CT imaging as such considered indeterminate.  May 03 2022:  PFTs. FEV1 158% FVC 150% FEV1/FVC 104%. DLCO 88.2%  June 11, 2022: CT-guided biopsy right lower lobe lung mass. Adenocarcinoma well-differentiated. IHC demonstrate cytokeratin 7 and TT F1 positivity   Will obtain MRI brain, CBC with  diff, CMP, CEA  Therapeutics:  Since patient has excellent performance status, good PFT's, disease which appears to be amenable to resection and good family support, recommended consultation with Thoracic surgery for consideration of resection.   Mild cognitive impairment:  This may predispose her to development of postoperative delirium      Cancer Staging  Lung cancer [facility]) Staging form: Lung, AJCC 8th Edition - Clinical stage from 06/19/2022: Stage IA3 (cT1c, cN0, cM0) - Signed by Barbee Cough, MD on 06/19/2022 Histopathologic type: Adenocarcinoma, NOS Stage prefix: Initial diagnosis Histologic grade (G): G1 Histologic grading system: 4 grade system    No problem-specific Assessment & Plan notes found for this  encounter.    Orders Placed This Encounter  Procedures   MR Brain W Wo Contrast    Standing Status:   Future    Standing Expiration Date:   06/19/2023    Order Specific Question:   If indicated for the ordered procedure, I authorize the administration of contrast media per Radiology protocol    Answer:   Yes    Order Specific Question:   What is the patient's sedation requirement?    Answer:   No Sedation    Order Specific Question:   Does the patient have a pacemaker or implanted devices?    Answer:   No    Order Specific Question:   Use SRS Protocol?    Answer:   No    Order Specific Question:   Preferred imaging location?    Answer:   External   CBC with Differential/Platelet    Standing Status:   Future    Standing Expiration Date:   06/20/2023   Comprehensive metabolic panel    Standing Status:   Future    Standing Expiration Date:   06/20/2023   CEA    Standing Status:   Future    Standing Expiration Date:   06/20/2023   Ambulatory referral to Cardiothoracic Surgery    Referral Priority:   Routine    Referral Type:   Surgical    Referral Reason:   Specialty Services Required    Requested Specialty:   Cardiothoracic Surgery    Number of Visits Requested:   1    60  minutes was spent in patient care.  This included time spent preparing to see the patient (e.g., review of tests), obtaining and/or reviewing separately obtained history, counseling and educating the patient/family/caregiver, ordering medications, tests, or procedures; documenting clinical information in the electronic or other health record, independently interpreting results and communicating results to the patient/family/caregiver as well as coordination of care.       All questions were answered. The patient knows to call the clinic with any problems, questions or concerns.  This note was electronically signed.    Barbee Cough, MD  06/19/2022 4:39 PM

## 2022-06-19 NOTE — Progress Notes (Deleted)
[facility] Initial Visit:  Patient Care Team: Ernestene Kiel, MD as PCP - General (Internal Medicine)  CHIEF COMPLAINTS/PURPOSE OF CONSULTATION:  Oncology History   No history exists.    HISTORY OF PRESENTING ILLNESS: [name] 77 y.o. female is here because of lung cancer Medical history notable for arthritis, cataracts, chronic back pain, colon polyps, hypertension, nephrolithiasis, neuropathy, shoulder arthroplasty, mild cognitive impairment  March 07, 2022: SOB and with cough.  .CT PA.  Underlying mild emphysematous changes. Spiculated appearing 2.9 x 2.7 cm mass in the superior segment of the right  lower lobe is worrisome for neoplasm. No other pulmonary lesions or pulmonary nodules. No pleural effusions.   April 20, 2022: PET/CT.  3 cm right lower lobe pulmonary mass (SUV 7.4).  No hypermetabolic mediastinal or hilar adenopathy.  Subtle FDG uptake in both adrenal glands without underlying nodule or mass on CT imaging as such considered indeterminate.  May 03, 2022: PFTs.  FEV1 158% FVC 150% FEV1/FVC 104%.  DLCO 88.2%  May 09, 2022: Bronchoscopy with lavage, cytology and biopsy nondiagnostic  June 11, 2022: CT-guided biopsy right lower lobe lung mass.  Adenocarcinoma well-differentiated.  IHC demonstrate cytokeratin 7 and TT F1 positivity  Review of Systems - Oncology  MEDICAL HISTORY: Past Medical History:  Diagnosis Date   Arthritis    Cataracts, bilateral    immature   Chronic back pain    spondylolisthesis   Complication of anesthesia    unable to get up due dizziness,nausea- stayed overnite   Constipation    takes Colace daily   Depression    but not on any meds   History of colon polyps    benign one time and precancerous another   History of colon polyps    History of kidney stones 12/2015   Hyperlipidemia    takes Pravastatin daily   Hypertension    takes Losartan daily   Hypothyroidism    takes  Synthroid daily   Kidney stones    Numbness    tingling/right lower leg    OA (osteoarthritis)    PONV (postoperative nausea and vomiting)    Shoulder arthritis    Right    SURGICAL HISTORY: Past Surgical History:  Procedure Laterality Date   ABDOMINAL HYSTERECTOMY  2012   bladder tack   BACK SURGERY     BRONCHOSCOPY     COLONOSCOPY  2014   Polyps   LUNG BIOPSY     MAXIMUM ACCESS (MAS)POSTERIOR LUMBAR INTERBODY FUSION (PLIF) 1 LEVEL N/A 07/19/2016   Procedure: LUMBAR FOUR-FIVE MAXIMUM ACCESS (MAS) POSTERIOR LUMBAR INTERBODY FUSION (PLIF), INSTRUMENTED LUMBAR THREE-FIVE;  Surgeon: Eustace Moore, MD;  Location: [place];  Service: Neurosurgery;  Laterality: N/A;   REVERSE SHOULDER ARTHROPLASTY Right 06/13/2017   Procedure: Conversion of right total shoulder arthroplasty to reverse shoulder arthroplasty;  Surgeon: Justice Britain, MD;  Location: [place];  Service: Orthopedics;  Laterality: Right;   SHOULDER SURGERY Left 2004   arthroscopy   SHOULDER SURGERY Right 06/13/2017    Conversion of right total shoulder arthroplasty to reverse shoulder arthroplasty (Right Shoulder)   TOTAL SHOULDER ARTHROPLASTY Left 01/19/2016   Procedure: TOTAL SHOULDER ARTHROPLASTY;  Surgeon: Justice Britain, MD;  Location: [place];  Service: Orthopedics;  Laterality: Left;   TOTAL SHOULDER ARTHROPLASTY Right 04/18/2017   Procedure: RIGHT TOTAL SHOULDER ARTHROPLASTY;  Surgeon: Justice Britain, MD;  Location: [place];  Service: Orthopedics;  Laterality: Right;   TUBAL LIGATION  02/1983    SOCIAL HISTORY:  Social History   Socioeconomic History   Marital status: Widowed    Spouse name: Not on file   Number of children: Not on file   Years of education: Not on file   Highest education level: Not on file  Occupational History   Not on file  Tobacco Use   Smoking status: Never   Smokeless tobacco: Never  Vaping Use   Vaping Use: Never used  Substance and Sexual Activity   Alcohol use: Yes    Alcohol/week: 0.0  standard drinks of alcohol    Comment: rarely   Drug use: No   Sexual activity: Not on file  Other Topics Concern   Not on file  Social History Narrative   Not on file   Social Determinants of Health   Financial Resource Strain: Not on file  Food Insecurity: Not on file  Transportation Needs: Not on file  Physical Activity: Not on file  Stress: Not on file  Social Connections: Not on file  Intimate Partner Violence: Not on file    FAMILY HISTORY Family History  Problem Relation Age of Onset   Lung cancer Mother    Lung cancer Father    Alcoholism Father    Alcoholism Brother    Memory loss Brother    Skin cancer Daughter    Dementia Neg Hx     ALLERGIES:  has No Known Allergies.  MEDICATIONS:  Current Outpatient Medications  Medication Sig Dispense Refill   acetaminophen (TYLENOL) 650 MG CR tablet Take 650-1,300 mg by mouth 3 (three) times daily as needed for pain.      cholecalciferol (VITAMIN D3) 25 MCG (1000 UNIT) tablet Take 1,000 Units by mouth daily.     COLLAGEN PO Take by mouth. Takes daily     diclofenac sodium (VOLTAREN) 1 % GEL Apply 1 application topically 3 (three) times daily as needed (joint pain).     donepezil (ARICEPT) 5 MG tablet Take 1 tablet (5 mg total) by mouth in the morning. 90 tablet 3   L-LYSINE PO Take 0.5 tablets by mouth daily.      levothyroxine (SYNTHROID, LEVOTHROID) 50 MCG tablet Take 50 mcg by mouth daily before breakfast.      losartan (COZAAR) 25 MG tablet Take 25 mg by mouth once daily  0   magnesium 30 MG tablet Take 30 mg by mouth as needed.     Melatonin 10 MG TABS Take by mouth.     meloxicam (MOBIC) 15 MG tablet Take 1 tablet by mouth daily.     Omega-3 Fatty Acids (FISH OIL) 1200 MG CAPS Take 1,200 mg by mouth every evening.     pravastatin (PRAVACHOL) 40 MG tablet Take 40 mg by mouth at bedtime.      QC STOOL SOFTENER 100 MG capsule Take 100 mg by mouth 2 (two) times daily as needed.     Current Facility-Administered  Medications  Medication Dose Route Frequency Provider Last Rate Last Admin   0.9 %  sodium chloride infusion  500 mL Intravenous Once Jackquline Denmark, MD        PHYSICAL EXAMINATION:  ECOG PERFORMANCE STATUS: {CHL ONC ECOG [id number]   There were no vitals filed for this visit.  There were no vitals filed for this visit.   Physical Exam   LABORATORY DATA: I have personally reviewed the data as listed:  No visits with results within 1 Month(s) from this visit.  Latest known visit with results is:  Office Visit on 09/06/2020  Component Date Value Ref Range Status   Homocysteine 09/06/2020 11.2  0.0 - 19.2 umol/L Final   Thiamine 09/06/2020 200.1 (H)  66.5 - 200.0 nmol/L Final    RADIOGRAPHIC STUDIES: I have personally reviewed the radiological images as listed and agree with the findings in the report  No results found.  ASSESSMENT/PLAN Cancer Staging  No matching staging information was found for the patient.   No problem-specific Assessment & Plan notes found for this encounter.    No orders of the defined types were placed in this encounter.     minutes was spent in patient care.  This included time spent preparing to see the patient (e.g., review of tests), obtaining and/or reviewing separately obtained history, counseling and educating the patient/family/caregiver, ordering medications, tests, or procedures; documenting clinical information in the electronic or other health record, independently interpreting results and communicating results to the patient/family/caregiver as well as coordination of care.       All questions were answered. The patient knows to call the clinic with any problems, questions or concerns.  This note was electronically signed.    Barbee Cough, MD  06/19/2022 11:09 AM

## 2022-06-21 ENCOUNTER — Encounter: Payer: Self-pay | Admitting: *Deleted

## 2022-06-21 DIAGNOSIS — R918 Other nonspecific abnormal finding of lung field: Secondary | ICD-10-CM | POA: Diagnosis not present

## 2022-06-21 DIAGNOSIS — G319 Degenerative disease of nervous system, unspecified: Secondary | ICD-10-CM | POA: Diagnosis not present

## 2022-06-21 DIAGNOSIS — C3431 Malignant neoplasm of lower lobe, right bronchus or lung: Secondary | ICD-10-CM | POA: Diagnosis not present

## 2022-06-25 DIAGNOSIS — C3431 Malignant neoplasm of lower lobe, right bronchus or lung: Secondary | ICD-10-CM | POA: Diagnosis not present

## 2022-06-26 ENCOUNTER — Inpatient Hospital Stay: Payer: Medicare Other | Attending: Oncology

## 2022-06-26 DIAGNOSIS — C3431 Malignant neoplasm of lower lobe, right bronchus or lung: Secondary | ICD-10-CM | POA: Diagnosis not present

## 2022-06-26 LAB — CBC WITH DIFFERENTIAL/PLATELET
Abs Immature Granulocytes: 0.03 10*3/uL (ref 0.00–0.07)
Basophils Absolute: 0.1 10*3/uL (ref 0.0–0.1)
Basophils Relative: 1 %
Eosinophils Absolute: 0.2 10*3/uL (ref 0.0–0.5)
Eosinophils Relative: 3 %
HCT: 41.4 % (ref 36.0–46.0)
Hemoglobin: 13.4 g/dL (ref 12.0–15.0)
Immature Granulocytes: 0 %
Lymphocytes Relative: 29 %
Lymphs Abs: 2.3 10*3/uL (ref 0.7–4.0)
MCH: 31.3 pg (ref 26.0–34.0)
MCHC: 32.4 g/dL (ref 30.0–36.0)
MCV: 96.7 fL (ref 80.0–100.0)
Monocytes Absolute: 0.6 10*3/uL (ref 0.1–1.0)
Monocytes Relative: 8 %
Neutro Abs: 4.6 10*3/uL (ref 1.7–7.7)
Neutrophils Relative %: 59 %
Platelets: 322 10*3/uL (ref 150–400)
RBC: 4.28 MIL/uL (ref 3.87–5.11)
RDW: 13.5 % (ref 11.5–15.5)
WBC: 7.9 10*3/uL (ref 4.0–10.5)
nRBC: 0 % (ref 0.0–0.2)

## 2022-06-26 LAB — COMPREHENSIVE METABOLIC PANEL
ALT: 13 U/L (ref 0–44)
AST: 18 U/L (ref 15–41)
Albumin: 4.1 g/dL (ref 3.5–5.0)
Alkaline Phosphatase: 83 U/L (ref 38–126)
Anion gap: 8 (ref 5–15)
BUN: 28 mg/dL — ABNORMAL HIGH (ref 8–23)
CO2: 26 mmol/L (ref 22–32)
Calcium: 9.3 mg/dL (ref 8.9–10.3)
Chloride: 108 mmol/L (ref 98–111)
Creatinine, Ser: 0.94 mg/dL (ref 0.44–1.00)
GFR, Estimated: >60 mL/min (ref >60)
Glucose, Bld: 95 mg/dL (ref 70–99)
Potassium: 3.8 mmol/L (ref 3.5–5.1)
Sodium: 142 mmol/L (ref 135–145)
Total Bilirubin: 0.7 mg/dL (ref 0.3–1.2)
Total Protein: 7 g/dL (ref 6.5–8.1)

## 2022-06-28 ENCOUNTER — Inpatient Hospital Stay: Payer: Medicare Other | Admitting: Oncology

## 2022-06-28 ENCOUNTER — Encounter: Payer: Self-pay | Admitting: Oncology

## 2022-06-28 ENCOUNTER — Telehealth: Payer: Self-pay | Admitting: Oncology

## 2022-06-28 VITALS — BP 171/83 | HR 91 | Temp 97.8°F | Resp 16 | Ht 64.0 in | Wt 191.5 lb

## 2022-06-28 DIAGNOSIS — R9089 Other abnormal findings on diagnostic imaging of central nervous system: Secondary | ICD-10-CM | POA: Diagnosis not present

## 2022-06-28 DIAGNOSIS — C3431 Malignant neoplasm of lower lobe, right bronchus or lung: Secondary | ICD-10-CM | POA: Diagnosis not present

## 2022-06-28 DIAGNOSIS — R4189 Other symptoms and signs involving cognitive functions and awareness: Secondary | ICD-10-CM | POA: Diagnosis not present

## 2022-06-28 NOTE — Progress Notes (Signed)
[facility] Initial Visit:  Patient Care Team: Ernestene Kiel, MD as PCP - General (Internal Medicine)  CHIEF COMPLAINTS/PURPOSE OF CONSULTATION:  Oncology History  Lung cancer ([place])  06/19/2022 Initial Diagnosis   Lung cancer ([place])   06/19/2022 Cancer Staging   Staging form: Lung, AJCC 8th Edition - Clinical stage from 06/19/2022: Stage IA3 (cT1c, cN0, cM0) - Signed by Barbee Cough, MD on 06/19/2022 Histopathologic type: Adenocarcinoma, NOS Stage prefix: Initial diagnosis Histologic grade (G): G1 Histologic grading system: 4 grade system     HISTORY OF PRESENTING ILLNESS: [name] 77 y.o. female is here because of lung cancer Medical history notable for arthritis, cataracts, chronic back pain, colon polyps, hypertension, nephrolithiasis, neuropathy, shoulder arthroplasty, mild cognitive impairment  March 07, 2022: SOB and with cough.  CT PA.  Underlying mild emphysematous changes. Spiculated appearing 2.9 x 2.7 cm mass in the superior segment of the right  lower lobe is worrisome for neoplasm. No other pulmonary lesions or pulmonary nodules. No pleural effusions.   April 20, 2022: PET/CT.  3 cm right lower lobe pulmonary mass (SUV 7.4).  No hypermetabolic mediastinal or hilar adenopathy.  Subtle FDG uptake in both adrenal glands without underlying nodule or mass on CT imaging as such considered indeterminate.  May 03, 2022: PFTs.  FEV1 158% FVC 150% FEV1/FVC 104%.  DLCO 88.2%  May 09, 2022: Bronchoscopy with lavage, cytology and biopsy nondiagnostic  June 11, 2022: CT-guided biopsy right lower lobe lung mass.  Adenocarcinoma well-differentiated.  IHC demonstrate cytokeratin 7 and TT F1 positivity  June 19 2022:  [place]  Patient feels well.  She is able to do pretty much what she wants to do.  Is a bit fatigued.    Social:  Widowed.  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  Lives by self.  No  history of tobacco use.  EtOH none.    June 21, 2022: MRI of brain--punctate focus of enhancement within the mid to posterior right frontal lobe white matter.  Chronic small vessel ischemic changes which are minimal  June 28, 2022: Scheduled follow-up for management of lung cancer.  Reviewed results of MRI with patient.    WBC 7.9 hemoglobin 13.4 platelet count 322; 59 segs 29 lymphs 8 monos 3 eos 1 basophil CMP notable for BUN of 28 creatinine 0.94.  July 11, 2022: Thoracic surgery consult   Review of Systems  Constitutional:  Negative for appetite change, chills, fatigue, fever and unexpected weight change.  HENT:   Negative for lump/mass, mouth sores, nosebleeds, sore throat, tinnitus, trouble swallowing and voice change.   Eyes:  Negative for eye problems and icterus.       Vision changes:  None  Respiratory:  Negative for chest tightness, hemoptysis, shortness of breath and wheezing.        Slight cough  Cardiovascular:  Negative for chest pain, leg swelling and palpitations.       PND:  none Orthopnea:  none  Gastrointestinal:  Negative for abdominal pain, blood in stool, constipation, diarrhea, nausea and vomiting.  Endocrine: Negative for hot flashes.       Cold intolerance:  none Heat intolerance:  none  Genitourinary:  Negative for bladder incontinence, difficulty urinating, dysuria, frequency, hematuria and nocturia.   Musculoskeletal:  Positive for arthralgias and back pain. Negative for gait problem, myalgias, neck pain and neck stiffness.  Skin:  Negative for itching, rash and wound.  Neurological:  Negative for dizziness, extremity  weakness, gait problem, headaches, light-headedness, numbness, seizures and speech difficulty.  Hematological:  Negative for adenopathy. Does not bruise/bleed easily.  Psychiatric/Behavioral:  Negative for sleep disturbance and suicidal ideas. The patient is not nervous/anxious.     MEDICAL HISTORY: Past Medical History:  Diagnosis  Date   Arthritis    Cataracts, bilateral    immature   Chronic back pain    spondylolisthesis   Complication of anesthesia    unable to get up due dizziness,nausea- stayed overnite   Constipation    takes Colace daily   Depression    but not on any meds   History of colon polyps    benign one time and precancerous another   History of colon polyps    History of kidney stones 12/2015   Hyperlipidemia    takes Pravastatin daily   Hypertension    takes Losartan daily   Hypothyroidism    takes Synthroid daily   Kidney stones    Numbness    tingling/right lower leg    OA (osteoarthritis)    PONV (postoperative nausea and vomiting)    Shoulder arthritis    Right    SURGICAL HISTORY: Past Surgical History:  Procedure Laterality Date   ABDOMINAL HYSTERECTOMY  2012   bladder tack   BACK SURGERY     BRONCHOSCOPY     COLONOSCOPY  2014   Polyps   LUNG BIOPSY     MAXIMUM ACCESS (MAS)POSTERIOR LUMBAR INTERBODY FUSION (PLIF) 1 LEVEL N/A 07/19/2016   Procedure: LUMBAR FOUR-FIVE MAXIMUM ACCESS (MAS) POSTERIOR LUMBAR INTERBODY FUSION (PLIF), INSTRUMENTED LUMBAR THREE-FIVE;  Surgeon: Eustace Moore, MD;  Location: [facility];  Service: Neurosurgery;  Laterality: N/A;   REVERSE SHOULDER ARTHROPLASTY Right 06/13/2017   Procedure: Conversion of right total shoulder arthroplasty to reverse shoulder arthroplasty;  Surgeon: Justice Britain, MD;  Location: [facility];  Service: Orthopedics;  Laterality: Right;   SHOULDER SURGERY Left 2004   arthroscopy   SHOULDER SURGERY Right 06/13/2017    Conversion of right total shoulder arthroplasty to reverse shoulder arthroplasty (Right Shoulder)   TOTAL SHOULDER ARTHROPLASTY Left 01/19/2016   Procedure: TOTAL SHOULDER ARTHROPLASTY;  Surgeon: Justice Britain, MD;  Location: [facility];  Service: Orthopedics;  Laterality: Left;   TOTAL SHOULDER ARTHROPLASTY Right 04/18/2017   Procedure: RIGHT TOTAL SHOULDER ARTHROPLASTY;  Surgeon: Justice Britain, MD;  Location: [facility];   Service: Orthopedics;  Laterality: Right;   TUBAL LIGATION  02/1983    SOCIAL HISTORY: Social History   Socioeconomic History   Marital status: Widowed    Spouse name: Not on file   Number of children: Not on file   Years of education: Not on file   Highest education level: Not on file  Occupational History   Not on file  Tobacco Use   Smoking status: Never    Passive exposure: Past   Smokeless tobacco: Never  Vaping Use   Vaping Use: Never used  Substance and Sexual Activity   Alcohol use: Yes    Alcohol/week: 0.0 standard drinks of alcohol    Comment: rarely   Drug use: No   Sexual activity: Not on file  Other Topics Concern   Not on file  Social History Narrative   Not on file   Social Determinants of Health   Financial Resource Strain: Not on file  Food Insecurity: No Food Insecurity (06/19/2022)   Hunger Vital Sign    Worried About Running Out of Food in the Last Year: Never true  Ran Out of Food in the Last Year: Never true  Transportation Needs: No Transportation Needs (06/19/2022)   PRAPARE - Hydrologist (Medical): No    Lack of Transportation (Non-Medical): No  Physical Activity: Not on file  Stress: Not on file  Social Connections: Not on file  Intimate Partner Violence: Not At Risk (06/19/2022)   Humiliation, Afraid, Rape, and Kick questionnaire    Fear of Current or Ex-Partner: No    Emotionally Abused: No    Physically Abused: No    Sexually Abused: No    FAMILY HISTORY Family History  Problem Relation Age of Onset   Lung cancer Mother    Lung cancer Father    Alcoholism Father    Alcoholism Brother    Skin cancer Daughter    Memory loss Half-Brother    Dementia Neg Hx     ALLERGIES:  has No Known Allergies.  MEDICATIONS:  Current Outpatient Medications  Medication Sig Dispense Refill   acetaminophen (TYLENOL) 650 MG CR tablet Take 650-1,300 mg by mouth 3 (three) times daily as needed for pain.       Calcium-Vitamin D-Vitamin K 650-12.5-40 MG-MCG-MCG CHEW Chew by mouth.     cholecalciferol (VITAMIN D3) 25 MCG (1000 UNIT) tablet Take 1,000 Units by mouth daily.     COLLAGEN PO Take by mouth. Takes daily     Cyanocobalamin (B-12 PO) Take by mouth.     diclofenac sodium (VOLTAREN) 1 % GEL Apply 1 application topically 3 (three) times daily as needed (joint pain).     donepezil (ARICEPT) 5 MG tablet Take 1 tablet (5 mg total) by mouth in the morning. 90 tablet 3   L-LYSINE PO Take 0.5 tablets by mouth daily.      levothyroxine (SYNTHROID, LEVOTHROID) 50 MCG tablet Take 50 mcg by mouth daily before breakfast.      losartan (COZAAR) 25 MG tablet Take 25 mg by mouth once daily  0   magnesium 30 MG tablet Take 30 mg by mouth as needed.     Melatonin 10 MG TABS Take by mouth.     meloxicam (MOBIC) 15 MG tablet Take 1 tablet by mouth daily.     Omega-3 Fatty Acids (FISH OIL) 1200 MG CAPS Take 1,200 mg by mouth every evening.     pravastatin (PRAVACHOL) 40 MG tablet Take 40 mg by mouth at bedtime.      QC STOOL SOFTENER 100 MG capsule Take 100 mg by mouth 2 (two) times daily as needed.     Current Facility-Administered Medications  Medication Dose Route Frequency Provider Last Rate Last Admin   0.9 %  sodium chloride infusion  500 mL Intravenous Once Jackquline Denmark, MD        PHYSICAL EXAMINATION:  ECOG PERFORMANCE STATUS: 0 - Asymptomatic   Vitals:   06/28/22 1001  BP: (!) 171/83  Pulse: 91  Resp: 16  Temp: 97.8 F (36.6 C)  SpO2: 95%    Filed Weights   06/28/22 1001  Weight: 191 lb 8 oz (86.9 kg)     Physical Exam Vitals and nursing note reviewed.  Constitutional:      General: She is not in acute distress.    Appearance: Normal appearance. She is normal weight. She is not ill-appearing, toxic-appearing or diaphoretic.     Comments: Here with daughter.  Comfortable  HENT:     Head: Normocephalic and atraumatic.     Right Ear: External ear normal.  Left Ear: External  ear normal.     Nose: Nose normal. No congestion or rhinorrhea.  Eyes:     General: No scleral icterus.    Extraocular Movements: Extraocular movements intact.     Conjunctiva/sclera: Conjunctivae normal.     Pupils: Pupils are equal, round, and reactive to light.  Cardiovascular:     Rate and Rhythm: Normal rate and regular rhythm.     Heart sounds: No murmur heard.    No friction rub. No gallop.  Pulmonary:     Effort: Pulmonary effort is normal. No respiratory distress.     Breath sounds: No stridor. Wheezing present. No rhonchi.  Abdominal:     General: Bowel sounds are normal.     Palpations: Abdomen is soft.     Tenderness: There is no abdominal tenderness. There is no guarding or rebound.  Musculoskeletal:        General: No swelling, tenderness or deformity.     Cervical back: Normal range of motion and neck supple. No rigidity or tenderness.     Right lower leg: No edema.     Left lower leg: No edema.  Lymphadenopathy:     Head:     Right side of head: No submental, submandibular, tonsillar, preauricular, posterior auricular or occipital adenopathy.     Left side of head: No submental, submandibular, tonsillar, preauricular, posterior auricular or occipital adenopathy.     Cervical: No cervical adenopathy.     Right cervical: No superficial, deep or posterior cervical adenopathy.    Left cervical: No superficial, deep or posterior cervical adenopathy.     Upper Body:     Right upper body: No supraclavicular, axillary, pectoral or epitrochlear adenopathy.     Left upper body: No supraclavicular, axillary, pectoral or epitrochlear adenopathy.  Skin:    General: Skin is warm.     Coloration: Skin is not jaundiced or pale.     Findings: No bruising or erythema.  Neurological:     General: No focal deficit present.     Mental Status: She is alert and oriented to person, place, and time. Mental status is at baseline.     Cranial Nerves: No cranial nerve deficit.     Motor:  No weakness.     Gait: Gait normal.  Psychiatric:        Mood and Affect: Mood normal.        Behavior: Behavior normal.        Thought Content: Thought content normal.        Judgment: Judgment normal.      LABORATORY DATA: I have personally reviewed the data as listed:  Appointment on 06/26/2022  Component Date Value Ref Range Status   CEA 06/26/2022 5.4 (H)  0.0 - 4.7 ng/mL Final   Comment: (NOTE)                             Nonsmokers          <3.9                             Smokers             <5.6 [employer] Electrochemiluminescence Immunoassay (ECLIA) Values obtained with different assay methods or kits cannot be used interchangeably.  Results cannot be interpreted as absolute evidence of the presence or absence of malignant disease. Performed At: [place]  [place], [place] [id number] Rush Farmer MD [id number]    Sodium 06/26/2022 142  135 - 145 mmol/L Final   Potassium 06/26/2022 3.8  3.5 - 5.1 mmol/L Final   Chloride 06/26/2022 108  98 - 111 mmol/L Final   CO2 06/26/2022 26  22 - 32 mmol/L Final   Glucose, Bld 06/26/2022 95  70 - 99 mg/dL Final   Glucose reference range applies only to samples taken after fasting for at least 8 hours.   BUN 06/26/2022 28 (H)  8 - 23 mg/dL Final   Creatinine, Ser 06/26/2022 0.94  0.44 - 1.00 mg/dL Final   Calcium 06/26/2022 9.3  8.9 - 10.3 mg/dL Final   Total Protein 06/26/2022 7.0  6.5 - 8.1 g/dL Final   Albumin 06/26/2022 4.1  3.5 - 5.0 g/dL Final   AST 06/26/2022 18  15 - 41 U/L Final   ALT 06/26/2022 13  0 - 44 U/L Final   Alkaline Phosphatase 06/26/2022 83  38 - 126 U/L Final   Total Bilirubin 06/26/2022 0.7  0.3 - 1.2 mg/dL Final   GFR, Estimated 06/26/2022 >60  >60 mL/min Final   Comment: (NOTE) Calculated using the CKD-EPI Creatinine Equation (2021)    Anion gap 06/26/2022 8  5 - 15 Final   Performed at [facility], [place] [address] [place], [place] [address]    WBC 06/26/2022 7.9  4.0 - 10.5 K/uL Final   RBC 06/26/2022 4.28  3.87 - 5.11 MIL/uL Final   Hemoglobin 06/26/2022 13.4  12.0 - 15.0 g/dL Final   HCT 06/26/2022 41.4  36.0 - 46.0 % Final   MCV 06/26/2022 96.7  80.0 - 100.0 fL Final   MCH 06/26/2022 31.3  26.0 - 34.0 pg Final   MCHC 06/26/2022 32.4  30.0 - 36.0 g/dL Final   RDW 06/26/2022 13.5  11.5 - 15.5 % Final   Platelets 06/26/2022 322  150 - 400 K/uL Final   nRBC 06/26/2022 0.0  0.0 - 0.2 % Final   Neutrophils Relative % 06/26/2022 59  % Final   Neutro Abs 06/26/2022 4.6  1.7 - 7.7 K/uL Final   Lymphocytes Relative 06/26/2022 29  % Final   Lymphs Abs 06/26/2022 2.3  0.7 - 4.0 K/uL Final   Monocytes Relative 06/26/2022 8  % Final   Monocytes Absolute 06/26/2022 0.6  0.1 - 1.0 K/uL Final   Eosinophils Relative 06/26/2022 3  % Final   Eosinophils Absolute 06/26/2022 0.2  0.0 - 0.5 K/uL Final   Basophils Relative 06/26/2022 1  % Final   Basophils Absolute 06/26/2022 0.1  0.0 - 0.1 K/uL Final   Immature Granulocytes 06/26/2022 0  % Final   Abs Immature Granulocytes 06/26/2022 0.03  0.00 - 0.07 K/uL Final   Performed at [facility], [place] [address] [place], [place] [address]    RADIOGRAPHIC STUDIES: I have personally reviewed the radiological images as listed and agree with the findings in the report  No results found.  ASSESSMENT/PLAN 77 y.o. nonsmoking female with newly diagnosed lung cancer.  Medical history notable for arthritis, cataracts, chronic back pain, colon polyps, hypertension, nephrolithiasis, neuropathy, shoulder arthroplasty, mild cognitive impairment  Adenocarcinoma, Right lung, lower lobe, Stage 1A3 (T1c N0 M0):    March 07 2022:  CT PA demonstrated 2.9 cm spiculated mass superior segment RLL  April 20 2022:  PET CT showed 3 cm RLL mass (SUV 7.4).  No hypermetabolic mediastinal or hilar adenopathy. Subtle FDG uptake in  both adrenal glands without underlying nodule or mass on CT imaging  as such considered indeterminate.  May 03 2022:  PFTs. FEV1 158% FVC 150% FEV1/FVC 104%. DLCO 88.2%   June 11, 2022: CT-guided biopsy right lower lobe lung mass. Adenocarcinoma well-differentiated. IHC demonstrate cytokeratin 7 and TT F1 positivity   June 21 2022:  MRI brain negative for metastasis   Therapeutics:  Since patient has excellent performance status, good PFT's, disease which appears to be amenable to resection and good family support, recommended consultation with Thoracic surgery for consideration of resection.   Mild cognitive impairment:  This may predispose her to development of postoperative delirium  June 21 2022:  MRI brain --punctate focus of enhancement within the mid to posterior right frontal lobe white matter.  Chronic small vessel ischemic changes which are minimal   June 28 2022:  MRI findings suggest that patient may have had a vascular event that caused the memory impairment rather than a progressive disorder     Cancer Staging  Lung cancer [facility]) Staging form: Lung, AJCC 8th Edition - Clinical stage from 06/19/2022: Stage IA3 (cT1c, cN0, cM0) - Signed by Barbee Cough, MD on 06/19/2022 Histopathologic type: Adenocarcinoma, NOS Stage prefix: Initial diagnosis Histologic grade (G): G1 Histologic grading system: 4 grade system    No problem-specific Assessment & Plan notes found for this encounter.    No orders of the defined types were placed in this encounter.   30  minutes was spent in patient care.  This included time spent preparing to see the patient (e.g., review of tests), obtaining and/or reviewing separately obtained history, counseling and educating the patient/family/caregiver, ordering medications, tests, or procedures; documenting clinical information in the electronic or other health record, independently interpreting results and communicating results to the patient/family/caregiver as well as coordination of care.        All questions were answered. The patient knows to call the clinic with any problems, questions or concerns.  This note was electronically signed.    Barbee Cough, MD  07/02/2022 4:34 PM

## 2022-06-28 NOTE — Telephone Encounter (Signed)
06/28/22 Next appt scheduled and confirmed with patient

## 2022-06-29 LAB — CEA: CEA: 5.4 ng/mL — ABNORMAL HIGH (ref 0.0–4.7)

## 2022-07-02 DIAGNOSIS — R9089 Other abnormal findings on diagnostic imaging of central nervous system: Secondary | ICD-10-CM | POA: Insufficient documentation

## 2022-07-04 DIAGNOSIS — C3491 Malignant neoplasm of unspecified part of right bronchus or lung: Secondary | ICD-10-CM | POA: Diagnosis not present

## 2022-07-04 DIAGNOSIS — R222 Localized swelling, mass and lump, trunk: Secondary | ICD-10-CM | POA: Diagnosis not present

## 2022-07-09 ENCOUNTER — Encounter: Payer: Self-pay | Admitting: Specialist

## 2022-07-10 DIAGNOSIS — D491 Neoplasm of unspecified behavior of respiratory system: Secondary | ICD-10-CM | POA: Diagnosis not present

## 2022-07-10 DIAGNOSIS — Z79899 Other long term (current) drug therapy: Secondary | ICD-10-CM | POA: Diagnosis not present

## 2022-07-10 DIAGNOSIS — I10 Essential (primary) hypertension: Secondary | ICD-10-CM | POA: Diagnosis not present

## 2022-07-10 DIAGNOSIS — M1990 Unspecified osteoarthritis, unspecified site: Secondary | ICD-10-CM | POA: Diagnosis not present

## 2022-07-10 DIAGNOSIS — C3491 Malignant neoplasm of unspecified part of right bronchus or lung: Secondary | ICD-10-CM | POA: Diagnosis not present

## 2022-07-11 ENCOUNTER — Encounter: Payer: Medicare Other | Admitting: Thoracic Surgery (Cardiothoracic Vascular Surgery)

## 2022-07-11 DIAGNOSIS — D491 Neoplasm of unspecified behavior of respiratory system: Secondary | ICD-10-CM | POA: Diagnosis not present

## 2022-07-26 DIAGNOSIS — Z9889 Other specified postprocedural states: Secondary | ICD-10-CM | POA: Diagnosis not present

## 2022-07-26 DIAGNOSIS — C3431 Malignant neoplasm of lower lobe, right bronchus or lung: Secondary | ICD-10-CM | POA: Diagnosis not present

## 2022-07-26 DIAGNOSIS — Z79899 Other long term (current) drug therapy: Secondary | ICD-10-CM | POA: Diagnosis not present

## 2022-07-26 DIAGNOSIS — Z8673 Personal history of transient ischemic attack (TIA), and cerebral infarction without residual deficits: Secondary | ICD-10-CM | POA: Diagnosis not present

## 2022-07-26 DIAGNOSIS — E669 Obesity, unspecified: Secondary | ICD-10-CM | POA: Diagnosis not present

## 2022-07-26 DIAGNOSIS — Z79890 Hormone replacement therapy (postmenopausal): Secondary | ICD-10-CM | POA: Diagnosis not present

## 2022-07-26 DIAGNOSIS — M549 Dorsalgia, unspecified: Secondary | ICD-10-CM | POA: Diagnosis not present

## 2022-07-26 DIAGNOSIS — Z6836 Body mass index (BMI) 36.0-36.9, adult: Secondary | ICD-10-CM | POA: Diagnosis not present

## 2022-07-26 DIAGNOSIS — J939 Pneumothorax, unspecified: Secondary | ICD-10-CM | POA: Diagnosis not present

## 2022-07-26 DIAGNOSIS — I10 Essential (primary) hypertension: Secondary | ICD-10-CM | POA: Diagnosis not present

## 2022-07-26 DIAGNOSIS — Z20822 Contact with and (suspected) exposure to covid-19: Secondary | ICD-10-CM | POA: Diagnosis not present

## 2022-07-26 DIAGNOSIS — M1990 Unspecified osteoarthritis, unspecified site: Secondary | ICD-10-CM | POA: Diagnosis not present

## 2022-07-26 DIAGNOSIS — G8929 Other chronic pain: Secondary | ICD-10-CM | POA: Diagnosis not present

## 2022-08-01 ENCOUNTER — Inpatient Hospital Stay: Payer: Medicare Other

## 2022-08-01 ENCOUNTER — Inpatient Hospital Stay: Payer: Medicare Other | Admitting: Oncology

## 2022-08-01 NOTE — Progress Notes (Deleted)
[facility] Cancer Initial Visit:  Patient Care Team: Philemon KingdomProchnau, Caroline, MD as PCP - General (Internal Medicine)  CHIEF COMPLAINTS/PURPOSE OF CONSULTATION:  Oncology History  Lung cancer  06/19/2022 Initial Diagnosis   Lung cancer (HCC)   06/19/2022 Cancer Staging   Staging form: Lung, AJCC 8th Edition - Clinical stage from 06/19/2022: Stage IA3 (cT1c, cN0, cM0) - Signed by Loni Museibakove, Emi Lymon C, MD on 06/19/2022 Histopathologic type: Adenocarcinoma, NOS Stage prefix: Initial diagnosis Histologic grade (G): G1 Histologic grading system: 4 grade system     HISTORY OF PRESENTING ILLNESS: [name] 77 y.o. female is here because of lung cancer Medical history notable for arthritis, cataracts, chronic back pain, colon polyps, hypertension, nephrolithiasis, neuropathy, shoulder arthroplasty, mild cognitive impairment  March 07, 2022: SOB and with cough.  CT PA.  Underlying mild emphysematous changes. Spiculated appearing 2.9 x 2.7 cm mass in the superior segment of the right  lower lobe is worrisome for neoplasm. No other pulmonary lesions or pulmonary nodules. No pleural effusions.   April 20, 2022: PET/CT.  3 cm right lower lobe pulmonary mass (SUV 7.4).  No hypermetabolic mediastinal or hilar adenopathy.  Subtle FDG uptake in both adrenal glands without underlying nodule or mass on CT imaging as such considered indeterminate.  May 03, 2022: PFTs.  FEV1 158% FVC 150% FEV1/FVC 104%.  DLCO 88.2%  May 09, 2022: Bronchoscopy with lavage, cytology and biopsy nondiagnostic  June 11, 2022: CT-guided biopsy right lower lobe lung mass.  Adenocarcinoma well-differentiated.  IHC demonstrate cytokeratin 7 and TT F1 positivity  June 19 20[facility]  Patient feels well.  She is able to do pretty much what she wants to do.  Is a bit fatigued.    Social:  Widowed.  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  Lives by self.  No  history of tobacco use.  EtOH none.    June 21, 2022: MRI of brain--punctate focus of enhancement within the mid to posterior right frontal lobe white matter.  Chronic small vessel ischemic changes which are minimal  June 28, 2022: Scheduled follow-up for management of lung cancer.  Reviewed results of MRI with patient.    WBC 7.9 hemoglobin 13.4 platelet count 322; 59 segs 29 lymphs 8 monos 3 eos 1 basophil CMP notable for BUN of 28 creatinine 0.94.  July 11, 2022: Thoracic surgery consult   Review of Systems  Constitutional:  Negative for appetite change, chills, fatigue, fever and unexpected weight change.  HENT:   Negative for lump/mass, mouth sores, nosebleeds, sore throat, tinnitus, trouble swallowing and voice change.   Eyes:  Negative for eye problems and icterus.       Vision changes:  None  Respiratory:  Negative for chest tightness, hemoptysis, shortness of breath and wheezing.        Slight cough  Cardiovascular:  Negative for chest pain, leg swelling and palpitations.       PND:  none Orthopnea:  none  Gastrointestinal:  Negative for abdominal pain, blood in stool, constipation, diarrhea, nausea and vomiting.  Endocrine: Negative for hot flashes.       Cold intolerance:  none Heat intolerance:  none  Genitourinary:  Negative for bladder incontinence, difficulty urinating, dysuria, frequency, hematuria and nocturia.   Musculoskeletal:  Positive for arthralgias and back pain. Negative for gait problem, myalgias, neck pain and neck stiffness.  Skin:  Negative for itching, rash and wound.  Neurological:  Negative for dizziness, extremity weakness,  gait problem, headaches, light-headedness, numbness, seizures and speech difficulty.  Hematological:  Negative for adenopathy. Does not bruise/bleed easily.  Psychiatric/Behavioral:  Negative for sleep disturbance and suicidal ideas. The patient is not nervous/anxious.     MEDICAL HISTORY: Past Medical History:  Diagnosis  Date  . Arthritis   . Cataracts, bilateral    immature  . Chronic back pain    spondylolisthesis  . Complication of anesthesia    unable to get up due dizziness,nausea- stayed overnite  . Constipation    takes Colace daily  . Depression    but not on any meds  . History of colon polyps    benign one time and precancerous another  . History of colon polyps   . History of kidney stones 12/2015  . Hyperlipidemia    takes Pravastatin daily  . Hypertension    takes Losartan daily  . Hypothyroidism    takes Synthroid daily  . Kidney stones   . Numbness    tingling/right lower leg   . OA (osteoarthritis)   . PONV (postoperative nausea and vomiting)   . Shoulder arthritis    Right    SURGICAL HISTORY: Past Surgical History:  Procedure Laterality Date  . ABDOMINAL HYSTERECTOMY  2012   bladder tack  . BACK SURGERY    . BRONCHOSCOPY    . COLONOSCOPY  2014   Polyps  . LUNG BIOPSY    . MAXIMUM ACCESS (MAS)POSTERIOR LUMBAR INTERBODY FUSION (PLIF) 1 LEVEL N/A 07/19/2016   Procedure: LUMBAR FOUR-FIVE MAXIMUM ACCESS (MAS) POSTERIOR LUMBAR INTERBODY FUSION (PLIF), INSTRUMENTED LUMBAR THREE-FIVE;  Surgeon: Tia Alert, MD;  Location: [facility] OR;  Service: Neurosurgery;  Laterality: N/A;  . REVERSE SHOULDER ARTHROPLASTY Right 06/13/2017   Procedure: Conversion of right total shoulder arthroplasty to reverse shoulder arthroplasty;  Surgeon: Francena Hanly, MD;  Location: [facility] OR;  Service: Orthopedics;  Laterality: Right;  . SHOULDER SURGERY Left 2004   arthroscopy  . SHOULDER SURGERY Right 06/13/2017    Conversion of right total shoulder arthroplasty to reverse shoulder arthroplasty (Right Shoulder)  . TOTAL SHOULDER ARTHROPLASTY Left 01/19/2016   Procedure: TOTAL SHOULDER ARTHROPLASTY;  Surgeon: Francena Hanly, MD;  Location: [facility] OR;  Service: Orthopedics;  Laterality: Left;  . TOTAL SHOULDER ARTHROPLASTY Right 04/18/2017   Procedure: RIGHT TOTAL SHOULDER ARTHROPLASTY;  Surgeon: Francena Hanly, MD;  Location: [facility] OR;  Service: Orthopedics;  Laterality: Right;  . TUBAL LIGATION  02/1983    SOCIAL HISTORY: Social History   Socioeconomic History  . Marital status: Widowed    Spouse name: Not on file  . Number of children: Not on file  . Years of education: Not on file  . Highest education level: Not on file  Occupational History  . Not on file  Tobacco Use  . Smoking status: Never    Passive exposure: Past  . Smokeless tobacco: Never  Vaping Use  . Vaping Use: Never used  Substance and Sexual Activity  . Alcohol use: Yes    Alcohol/week: 0.0 standard drinks of alcohol    Comment: rarely  . Drug use: No  . Sexual activity: Not on file  Other Topics Concern  . Not on file  Social History Narrative  . Not on file   Social Determinants of Health   Financial Resource Strain: Not on file  Food Insecurity: No Food Insecurity (06/19/2022)   Hunger Vital Sign   . Worried About Programme researcher, broadcasting/film/video in the Last Year: Never true   . Ran  Out of Food in the Last Year: Never true  Transportation Needs: No Transportation Needs (06/19/2022)   PRAPARE - Transportation   . Lack of Transportation (Medical): No   . Lack of Transportation (Non-Medical): No  Physical Activity: Not on file  Stress: Not on file  Social Connections: Not on file  Intimate Partner Violence: Not At Risk (06/19/2022)   Humiliation, Afraid, Rape, and Kick questionnaire   . Fear of Current or Ex-Partner: No   . Emotionally Abused: No   . Physically Abused: No   . Sexually Abused: No    FAMILY HISTORY Family History  Problem Relation Age of Onset  . Lung cancer Mother   . Lung cancer Father   . Alcoholism Father   . Alcoholism Brother   . Skin cancer Daughter   . Memory loss Half-Brother   . Dementia Neg Hx     ALLERGIES:  has No Known Allergies.  MEDICATIONS:  Current Outpatient Medications  Medication Sig Dispense Refill  . acetaminophen (TYLENOL) 650 MG CR tablet Take 650-1,300 mg by  mouth 3 (three) times daily as needed for pain.     . Calcium-Vitamin D-Vitamin K 650-12.5-40 MG-MCG-MCG CHEW Chew by mouth.    . cholecalciferol (VITAMIN D3) 25 MCG (1000 UNIT) tablet Take 1,000 Units by mouth daily.    . COLLAGEN PO Take by mouth. Takes daily    . Cyanocobalamin (B-12 PO) Take by mouth.    . diclofenac sodium (VOLTAREN) 1 % GEL Apply 1 application topically 3 (three) times daily as needed (joint pain).    Marland Kitchen donepezil (ARICEPT) 5 MG tablet Take 1 tablet (5 mg total) by mouth in the morning. 90 tablet 3  . L-LYSINE PO Take 0.5 tablets by mouth daily.     Marland Kitchen levothyroxine (SYNTHROID, LEVOTHROID) 50 MCG tablet Take 50 mcg by mouth daily before breakfast.     . losartan (COZAAR) 25 MG tablet Take 25 mg by mouth once daily  0  . magnesium 30 MG tablet Take 30 mg by mouth as needed.    . Melatonin 10 MG TABS Take by mouth.    . meloxicam (MOBIC) 15 MG tablet Take 1 tablet by mouth daily.    . Omega-3 Fatty Acids (FISH OIL) 1200 MG CAPS Take 1,200 mg by mouth every evening.    . pravastatin (PRAVACHOL) 40 MG tablet Take 40 mg by mouth at bedtime.     . QC STOOL SOFTENER 100 MG capsule Take 100 mg by mouth 2 (two) times daily as needed.     Current Facility-Administered Medications  Medication Dose Route Frequency Provider Last Rate Last Admin  . 0.9 %  sodium chloride infusion  500 mL Intravenous Once Lynann Bologna, MD        PHYSICAL EXAMINATION:  ECOG PERFORMANCE STATUS: 0 - Asymptomatic   There were no vitals filed for this visit.   There were no vitals filed for this visit.    Physical Exam Vitals and nursing note reviewed.  Constitutional:      General: She is not in acute distress.    Appearance: Normal appearance. She is normal weight. She is not ill-appearing, toxic-appearing or diaphoretic.     Comments: Here with daughter.  Comfortable  HENT:     Head: Normocephalic and atraumatic.     Right Ear: External ear normal.     Left Ear: External ear normal.      Nose: Nose normal. No congestion or rhinorrhea.  Eyes:  General: No scleral icterus.    Extraocular Movements: Extraocular movements intact.     Conjunctiva/sclera: Conjunctivae normal.     Pupils: Pupils are equal, round, and reactive to light.  Cardiovascular:     Rate and Rhythm: Normal rate and regular rhythm.     Heart sounds: No murmur heard.    No friction rub. No gallop.  Pulmonary:     Effort: Pulmonary effort is normal. No respiratory distress.     Breath sounds: No stridor. Wheezing present. No rhonchi.  Abdominal:     General: Bowel sounds are normal.     Palpations: Abdomen is soft.     Tenderness: There is no abdominal tenderness. There is no guarding or rebound.  Musculoskeletal:        General: No swelling, tenderness or deformity.     Cervical back: Normal range of motion and neck supple. No rigidity or tenderness.     Right lower leg: No edema.     Left lower leg: No edema.  Lymphadenopathy:     Head:     Right side of head: No submental, submandibular, tonsillar, preauricular, posterior auricular or occipital adenopathy.     Left side of head: No submental, submandibular, tonsillar, preauricular, posterior auricular or occipital adenopathy.     Cervical: No cervical adenopathy.     Right cervical: No superficial, deep or posterior cervical adenopathy.    Left cervical: No superficial, deep or posterior cervical adenopathy.     Upper Body:     Right upper body: No supraclavicular, axillary, pectoral or epitrochlear adenopathy.     Left upper body: No supraclavicular, axillary, pectoral or epitrochlear adenopathy.  Skin:    General: Skin is warm.     Coloration: Skin is not jaundiced or pale.     Findings: No bruising or erythema.  Neurological:     General: No focal deficit present.     Mental Status: She is alert and oriented to person, place, and time. Mental status is at baseline.     Cranial Nerves: No cranial nerve deficit.     Motor: No weakness.      Gait: Gait normal.  Psychiatric:        Mood and Affect: Mood normal.        Behavior: Behavior normal.        Thought Content: Thought content normal.        Judgment: Judgment normal.     LABORATORY DATA: I have personally reviewed the data as listed:  No visits with results within 1 Month(s) from this visit.  Latest known visit with results is:  Appointment on 06/26/2022  Component Date Value Ref Range Status  . CEA 06/26/2022 5.4 (H)  0.0 - 4.7 ng/mL Final   Comment: (NOTE)                             Nonsmokers          <3.9                             Smokers             <5.6 [employer] Electrochemiluminescence Immunoassay (ECLIA) Values obtained with different assay methods or kits cannot be used interchangeably.  Results cannot be interpreted as absolute evidence of the presence or absence of malignant disease. Performed At: [facility] [address] [place], [place] [id number]  Jolene Schimke MD MD:[id number]   . Sodium 06/26/2022 142  135 - 145 mmol/L Final  . Potassium 06/26/2022 3.8  3.5 - 5.1 mmol/L Final  . Chloride 06/26/2022 108  98 - 111 mmol/L Final  . CO2 06/26/2022 26  22 - 32 mmol/L Final  . Glucose, Bld 06/26/2022 95  70 - 99 mg/dL Final   Glucose reference range applies only to samples taken after fasting for at least 8 hours.  . BUN 06/26/2022 28 (H)  8 - 23 mg/dL Final  . Creatinine, Ser 06/26/2022 0.94  0.44 - 1.00 mg/dL Final  . Calcium 34/06/7094 9.3  8.9 - 10.3 mg/dL Final  . Total Protein 06/26/2022 7.0  6.5 - 8.1 g/dL Final  . Albumin 43/83/8184 4.1  3.5 - 5.0 g/dL Final  . AST 03/75/4360 18  15 - 41 U/L Final  . ALT 06/26/2022 13  0 - 44 U/L Final  . Alkaline Phosphatase 06/26/2022 83  38 - 126 U/L Final  . Total Bilirubin 06/26/2022 0.7  0.3 - 1.2 mg/dL Final  . GFR, Estimated 06/26/2022 >60  >60 mL/min Final   Comment: (NOTE) Calculated using the CKD-EPI Creatinine Equation (2021)   . Anion gap 06/26/2022 8  5 -  15 Final   Performed at [facility], [address] [place], [place] [address]  . WBC 06/26/2022 7.9  4.0 - 10.5 K/uL Final  . RBC 06/26/2022 4.28  3.87 - 5.11 MIL/uL Final  . Hemoglobin 06/26/2022 13.4  12.0 - 15.0 g/dL Final  . HCT 40/35/2481 41.4  36.0 - 46.0 % Final  . MCV 06/26/2022 96.7  80.0 - 100.0 fL Final  . MCH 06/26/2022 31.3  26.0 - 34.0 pg Final  . MCHC 06/26/2022 32.4  30.0 - 36.0 g/dL Final  . RDW 85/90/9311 13.5  11.5 - 15.5 % Final  . Platelets 06/26/2022 322  150 - 400 K/uL Final  . nRBC 06/26/2022 0.0  0.0 - 0.2 % Final  . Neutrophils Relative % 06/26/2022 59  % Final  . Neutro Abs 06/26/2022 4.6  1.7 - 7.7 K/uL Final  . Lymphocytes Relative 06/26/2022 29  % Final  . Lymphs Abs 06/26/2022 2.3  0.7 - 4.0 K/uL Final  . Monocytes Relative 06/26/2022 8  % Final  . Monocytes Absolute 06/26/2022 0.6  0.1 - 1.0 K/uL Final  . Eosinophils Relative 06/26/2022 3  % Final  . Eosinophils Absolute 06/26/2022 0.2  0.0 - 0.5 K/uL Final  . Basophils Relative 06/26/2022 1  % Final  . Basophils Absolute 06/26/2022 0.1  0.0 - 0.1 K/uL Final  . Immature Granulocytes 06/26/2022 0  % Final  . Abs Immature Granulocytes 06/26/2022 0.03  0.00 - 0.07 K/uL Final   Performed at [facility], [address], [place], [place] [address]    RADIOGRAPHIC STUDIES: I have personally reviewed the radiological images as listed and agree with the findings in the report  No results found.  ASSESSMENT/PLAN 77 y.o. nonsmoking female with newly diagnosed lung cancer.  Medical history notable for arthritis, cataracts, chronic back pain, colon polyps, hypertension, nephrolithiasis, neuropathy, shoulder arthroplasty, mild cognitive impairment  Adenocarcinoma, Right lung, lower lobe, Stage 1A3 (T1c N0 M0):    March 07 2022:  CT PA demonstrated 2.9 cm spiculated mass superior segment RLL  April 20 2022:  PET CT showed 3 cm RLL mass (SUV 7.4).  No hypermetabolic  mediastinal or hilar adenopathy. Subtle FDG uptake in both adrenal glands without underlying  nodule or mass on CT imaging as such considered indeterminate.  May 03 2022:  PFTs. FEV1 158% FVC 150% FEV1/FVC 104%. DLCO 88.2%   June 11, 2022: CT-guided biopsy right lower lobe lung mass. Adenocarcinoma well-differentiated. IHC demonstrate cytokeratin 7 and TT F1 positivity   June 21 2022:  MRI brain negative for metastasis   Therapeutics:  Since patient has excellent performance status, good PFT's, disease which appears to be amenable to resection and good family support, recommended consultation with Thoracic surgery for consideration of resection.   Mild cognitive impairment:  This may predispose her to development of postoperative delirium  June 21 2022:  MRI brain --punctate focus of enhancement within the mid to posterior right frontal lobe white matter.  Chronic small vessel ischemic changes which are minimal   June 28 2022:  MRI findings suggest that patient may have had a vascular event that caused the memory impairment rather than a progressive disorder     Cancer Staging  Lung cancer Staging form: Lung, AJCC 8th Edition - Clinical stage from 06/19/2022: Stage IA3 (cT1c, cN0, cM0) - Signed by Loni Muse, MD on 06/19/2022 Histopathologic type: Adenocarcinoma, NOS Stage prefix: Initial diagnosis Histologic grade (G): G1 Histologic grading system: 4 grade system    No problem-specific Assessment & Plan notes found for this encounter.    No orders of the defined types were placed in this encounter.   30  minutes was spent in patient care.  This included time spent preparing to see the patient (e.g., review of tests), obtaining and/or reviewing separately obtained history, counseling and educating the patient/family/caregiver, ordering medications, tests, or procedures; documenting clinical information in the electronic or other health record, independently  interpreting results and communicating results to the patient/family/caregiver as well as coordination of care.       All questions were answered. The patient knows to call the clinic with any problems, questions or concerns.  This note was electronically signed.    Loni Muse, MD  08/01/2022 9:32 AM

## 2022-08-06 ENCOUNTER — Encounter: Payer: Self-pay | Admitting: Oncology

## 2022-08-06 ENCOUNTER — Inpatient Hospital Stay: Payer: Medicare Other

## 2022-08-06 ENCOUNTER — Inpatient Hospital Stay: Payer: Medicare Other | Attending: Oncology | Admitting: Oncology

## 2022-08-06 VITALS — BP 138/83 | HR 93 | Temp 98.1°F | Resp 18 | Ht 64.0 in | Wt 188.9 lb

## 2022-08-06 DIAGNOSIS — C3431 Malignant neoplasm of lower lobe, right bronchus or lung: Secondary | ICD-10-CM

## 2022-08-06 DIAGNOSIS — Z09 Encounter for follow-up examination after completed treatment for conditions other than malignant neoplasm: Secondary | ICD-10-CM | POA: Diagnosis not present

## 2022-08-06 NOTE — Progress Notes (Signed)
[facility] Cancer Initial Visit:  Patient Care Team: Philemon Kingdom, MD as PCP - General (Internal Medicine)  CHIEF COMPLAINTS/PURPOSE OF CONSULTATION:  Oncology History  Lung cancer  06/19/2022 Initial Diagnosis   Lung cancer (HCC)   06/19/2022 Cancer Staging   Staging form: Lung, AJCC 8th Edition - Clinical stage from 06/19/2022: Stage IA3 (cT1c, cN0, cM0) - Signed by Loni Muse, MD on 06/19/2022 Histopathologic type: Adenocarcinoma, NOS Stage prefix: Initial diagnosis Histologic grade (G): G1 Histologic grading system: 4 grade system     HISTORY OF PRESENTING ILLNESS: [name] 77 y.o. female is here because of lung cancer Medical history notable for arthritis, cataracts, chronic back pain, colon polyps, hypertension, nephrolithiasis, neuropathy, shoulder arthroplasty, mild cognitive impairment  March 07, 2022: SOB and with cough.  CT PA.  Underlying mild emphysematous changes. Spiculated appearing 2.9 x 2.7 cm mass in the superior segment of the right  lower lobe is worrisome for neoplasm. No other pulmonary lesions or pulmonary nodules. No pleural effusions.   April 20, 2022: PET/CT.  3 cm right lower lobe pulmonary mass (SUV 7.4).  No hypermetabolic mediastinal or hilar adenopathy.  Subtle FDG uptake in both adrenal glands without underlying nodule or mass on CT imaging as such considered indeterminate.  May 03, 2022: PFTs.  FEV1 158% FVC 150% FEV1/FVC 104%.  DLCO 88.2%  May 09, 2022: Bronchoscopy with lavage, cytology and biopsy nondiagnostic  June 11, 2022: CT-guided biopsy right lower lobe lung mass.  Adenocarcinoma well-differentiated.  IHC demonstrate cytokeratin 7 and TT F1 positivity  June 19 2022:  [facility] Consult  Patient feels well.  She is able to do pretty much what she wants to do.  Is a bit fatigued.    Social:  Widowed.  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  Lives by self.  No  history of tobacco use.  EtOH none.    June 21, 2022: MRI of brain--punctate focus of enhancement within the mid to posterior right frontal lobe white matter.  Chronic small vessel ischemic changes which are minimal  June 28, 2022: Scheduled follow-up for management of lung cancer.  Reviewed results of MRI with patient.    WBC 7.9 hemoglobin 13.4 platelet count 322; 59 segs 29 lymphs 8 monos 3 eos 1 basophil CMP notable for BUN of 28 creatinine 0.94.  July 11, 2022: Thoracic surgery consult  July 26 2022:  Right - ROBOTIC XI THORACOSCOPY, SURGICAL; WITH LOBECTOMY (SINGLE LOBE) BRONCHOSCPY, RIGID OR FLEXIBLE, W/FLUORO; WITH THERAPEUTIC ASPIRATION OF TRACHEOBRONCHIAL TREE, INITIAL Right - ROBOTIC XI THORACOSCOPY, SURGICAL; WITH MEDIASTINAL & REGIONAL LYMPHADENECTOMY   August 06 2022:  Scheduled follow up regarding lung cancer.  Has made a good postop recovery.  Will need final pathology from [facility].  To be seen for surgical follow up in 2 weeks.     Review of Systems  Constitutional:  Negative for appetite change, chills, fatigue, fever and unexpected weight change.  HENT:   Negative for lump/mass, mouth sores, nosebleeds, sore throat, tinnitus, trouble swallowing and voice change.   Eyes:  Negative for eye problems and icterus.       Vision changes:  None  Respiratory:  Negative for chest tightness, hemoptysis, shortness of breath and wheezing.        Slight cough  Cardiovascular:  Negative for chest pain, leg swelling and palpitations.       PND:  none Orthopnea:  none  Gastrointestinal:  Negative for abdominal pain, blood  in stool, constipation, diarrhea, nausea and vomiting.  Endocrine: Negative for hot flashes.       Cold intolerance:  none Heat intolerance:  none  Genitourinary:  Negative for bladder incontinence, difficulty urinating, dysuria, frequency, hematuria and nocturia.   Musculoskeletal:  Positive for arthralgias and back pain. Negative for gait problem,  myalgias, neck pain and neck stiffness.  Skin:  Negative for itching, rash and wound.  Neurological:  Negative for dizziness, extremity weakness, gait problem, headaches, light-headedness, numbness, seizures and speech difficulty.  Hematological:  Negative for adenopathy. Does not bruise/bleed easily.  Psychiatric/Behavioral:  Negative for sleep disturbance and suicidal ideas. The patient is not nervous/anxious.     MEDICAL HISTORY: Past Medical History:  Diagnosis Date   Arthritis    Cataracts, bilateral    immature   Chronic back pain    spondylolisthesis   Complication of anesthesia    unable to get up due dizziness,nausea- stayed overnite   Constipation    takes Colace daily   Depression    but not on any meds   History of colon polyps    benign one time and precancerous another   History of colon polyps    History of kidney stones 12/2015   Hyperlipidemia    takes Pravastatin daily   Hypertension    takes Losartan daily   Hypothyroidism    takes Synthroid daily   Kidney stones    Numbness    tingling/right lower leg    OA (osteoarthritis)    PONV (postoperative nausea and vomiting)    Shoulder arthritis    Right    SURGICAL HISTORY: Past Surgical History:  Procedure Laterality Date   ABDOMINAL HYSTERECTOMY  2012   bladder tack   BACK SURGERY     BRONCHOSCOPY     COLONOSCOPY  2014   Polyps   LUNG BIOPSY     MAXIMUM ACCESS (MAS)POSTERIOR LUMBAR INTERBODY FUSION (PLIF) 1 LEVEL N/A 07/19/2016   Procedure: LUMBAR FOUR-FIVE MAXIMUM ACCESS (MAS) POSTERIOR LUMBAR INTERBODY FUSION (PLIF), INSTRUMENTED LUMBAR THREE-FIVE;  Surgeon: Tia Alert, MD;  Location: [facility] OR;  Service: Neurosurgery;  Laterality: N/A;   REVERSE SHOULDER ARTHROPLASTY Right 06/13/2017   Procedure: Conversion of right total shoulder arthroplasty to reverse shoulder arthroplasty;  Surgeon: Francena Hanly, MD;  Location: [facility] OR;  Service: Orthopedics;  Laterality: Right;   SHOULDER SURGERY Left 2004    arthroscopy   SHOULDER SURGERY Right 06/13/2017    Conversion of right total shoulder arthroplasty to reverse shoulder arthroplasty (Right Shoulder)   TOTAL SHOULDER ARTHROPLASTY Left 01/19/2016   Procedure: TOTAL SHOULDER ARTHROPLASTY;  Surgeon: Francena Hanly, MD;  Location: [facility] OR;  Service: Orthopedics;  Laterality: Left;   TOTAL SHOULDER ARTHROPLASTY Right 04/18/2017   Procedure: RIGHT TOTAL SHOULDER ARTHROPLASTY;  Surgeon: Francena Hanly, MD;  Location: [facility] OR;  Service: Orthopedics;  Laterality: Right;   TUBAL LIGATION  02/1983    SOCIAL HISTORY: Social History   Socioeconomic History   Marital status: Widowed    Spouse name: Not on file   Number of children: Not on file   Years of education: Not on file   Highest education level: Not on file  Occupational History   Not on file  Tobacco Use   Smoking status: Never    Passive exposure: Past   Smokeless tobacco: Never  Vaping Use   Vaping Use: Never used  Substance and Sexual Activity   Alcohol use: Yes    Alcohol/week: 0.0 standard drinks of alcohol  Comment: rarely   Drug use: No   Sexual activity: Not on file  Other Topics Concern   Not on file  Social History Narrative   Not on file   Social Determinants of Health   Financial Resource Strain: Not on file  Food Insecurity: No Food Insecurity (06/19/2022)   Hunger Vital Sign    Worried About Running Out of Food in the Last Year: Never true    Ran Out of Food in the Last Year: Never true  Transportation Needs: No Transportation Needs (06/19/2022)   PRAPARE - Administrator, Civil Service (Medical): No    Lack of Transportation (Non-Medical): No  Physical Activity: Not on file  Stress: Not on file  Social Connections: Not on file  Intimate Partner Violence: Not At Risk (06/19/2022)   Humiliation, Afraid, Rape, and Kick questionnaire    Fear of Current or Ex-Partner: No    Emotionally Abused: No    Physically Abused: No    Sexually Abused: No     FAMILY HISTORY Family History  Problem Relation Age of Onset   Lung cancer Mother    Lung cancer Father    Alcoholism Father    Alcoholism Brother    Skin cancer Daughter    Memory loss Half-Brother    Dementia Neg Hx     ALLERGIES:  has No Known Allergies.  MEDICATIONS:  Current Outpatient Medications  Medication Sig Dispense Refill   oxyCODONE (OXY IR/ROXICODONE) 5 MG immediate release tablet Take by mouth.     acetaminophen (TYLENOL) 650 MG CR tablet Take 650-1,300 mg by mouth 3 (three) times daily as needed for pain.      Calcium-Vitamin D-Vitamin K 650-12.5-40 MG-MCG-MCG CHEW Chew by mouth.     cholecalciferol (VITAMIN D3) 25 MCG (1000 UNIT) tablet Take 1,000 Units by mouth daily.     COLLAGEN PO Take by mouth. Takes daily     Cyanocobalamin (B-12 PO) Take by mouth.     diclofenac sodium (VOLTAREN) 1 % GEL Apply 1 application topically 3 (three) times daily as needed (joint pain).     donepezil (ARICEPT) 5 MG tablet Take 1 tablet (5 mg total) by mouth in the morning. 90 tablet 3   L-LYSINE PO Take 0.5 tablets by mouth daily.      levothyroxine (SYNTHROID, LEVOTHROID) 50 MCG tablet Take 50 mcg by mouth daily before breakfast.      losartan (COZAAR) 25 MG tablet Take 25 mg by mouth once daily  0   magnesium 30 MG tablet Take 30 mg by mouth as needed.     Melatonin 10 MG TABS Take by mouth.     meloxicam (MOBIC) 15 MG tablet Take 1 tablet by mouth daily.     Omega-3 Fatty Acids (FISH OIL) 1200 MG CAPS Take 1,200 mg by mouth every evening.     pravastatin (PRAVACHOL) 40 MG tablet Take 40 mg by mouth at bedtime.      QC STOOL SOFTENER 100 MG capsule Take 100 mg by mouth 2 (two) times daily as needed.     Current Facility-Administered Medications  Medication Dose Route Frequency Provider Last Rate Last Admin   0.9 %  sodium chloride infusion  500 mL Intravenous Once Lynann Bologna, MD        PHYSICAL EXAMINATION:  ECOG PERFORMANCE STATUS: 0 - Asymptomatic   There were  no vitals filed for this visit.   Filed Weights   08/06/22 1333  Weight: 188 lb 14.4 oz (  85.7 kg)      Physical Exam Vitals and nursing note reviewed.  Constitutional:      General: She is not in acute distress.    Appearance: Normal appearance. She is normal weight. She is not ill-appearing, toxic-appearing or diaphoretic.     Comments: Here with daughter.  Comfortable  HENT:     Head: Normocephalic and atraumatic.     Right Ear: External ear normal.     Left Ear: External ear normal.     Nose: Nose normal. No congestion or rhinorrhea.  Eyes:     General: No scleral icterus.    Extraocular Movements: Extraocular movements intact.     Conjunctiva/sclera: Conjunctivae normal.     Pupils: Pupils are equal, round, and reactive to light.  Cardiovascular:     Rate and Rhythm: Normal rate and regular rhythm.     Heart sounds: No murmur heard.    No friction rub. No gallop.  Pulmonary:     Effort: Pulmonary effort is normal. No respiratory distress.     Breath sounds: No stridor. Wheezing present. No rhonchi.  Abdominal:     General: Bowel sounds are normal.     Palpations: Abdomen is soft.     Tenderness: There is no abdominal tenderness. There is no guarding or rebound.  Musculoskeletal:        General: No swelling, tenderness or deformity.     Cervical back: Normal range of motion and neck supple. No rigidity or tenderness.     Right lower leg: No edema.     Left lower leg: No edema.  Lymphadenopathy:     Head:     Right side of head: No submental, submandibular, tonsillar, preauricular, posterior auricular or occipital adenopathy.     Left side of head: No submental, submandibular, tonsillar, preauricular, posterior auricular or occipital adenopathy.     Cervical: No cervical adenopathy.     Right cervical: No superficial, deep or posterior cervical adenopathy.    Left cervical: No superficial, deep or posterior cervical adenopathy.     Upper Body:     Right upper body:  No supraclavicular, axillary, pectoral or epitrochlear adenopathy.     Left upper body: No supraclavicular, axillary, pectoral or epitrochlear adenopathy.  Skin:    General: Skin is warm.     Coloration: Skin is not jaundiced or pale.     Findings: No bruising or erythema.  Neurological:     General: No focal deficit present.     Mental Status: She is alert and oriented to person, place, and time. Mental status is at baseline.     Cranial Nerves: No cranial nerve deficit.     Motor: No weakness.     Gait: Gait normal.  Psychiatric:        Mood and Affect: Mood normal.        Behavior: Behavior normal.        Thought Content: Thought content normal.        Judgment: Judgment normal.      LABORATORY DATA: I have personally reviewed the data as listed:  No visits with results within 1 Month(s) from this visit.  Latest known visit with results is:  Appointment on 06/26/2022  Component Date Value Ref Range Status   CEA 06/26/2022 5.4 (H)  0.0 - 4.7 ng/mL Final   Comment: (NOTE)  Nonsmokers          <3.9                             Smokers             <5.6 [employer] Electrochemiluminescence Immunoassay (ECLIA) Values obtained with different assay methods or kits cannot be used interchangeably.  Results cannot be interpreted as absolute evidence of the presence or absence of malignant disease. Performed At: [facility] [address] [place], [place] [id number] Jolene Schimke MD WU:[id number]    Sodium 06/26/2022 142  135 - 145 mmol/L Final   Potassium 06/26/2022 3.8  3.5 - 5.1 mmol/L Final   Chloride 06/26/2022 108  98 - 111 mmol/L Final   CO2 06/26/2022 26  22 - 32 mmol/L Final   Glucose, Bld 06/26/2022 95  70 - 99 mg/dL Final   Glucose reference range applies only to samples taken after fasting for at least 8 hours.   BUN 06/26/2022 28 (H)  8 - 23 mg/dL Final   Creatinine, Ser 06/26/2022 0.94  0.44 - 1.00 mg/dL Final   Calcium  95/62/1308 9.3  8.9 - 10.3 mg/dL Final   Total Protein 65/78/4696 7.0  6.5 - 8.1 g/dL Final   Albumin 29/52/8413 4.1  3.5 - 5.0 g/dL Final   AST 24/40/1027 18  15 - 41 U/L Final   ALT 06/26/2022 13  0 - 44 U/L Final   Alkaline Phosphatase 06/26/2022 83  38 - 126 U/L Final   Total Bilirubin 06/26/2022 0.7  0.3 - 1.2 mg/dL Final   GFR, Estimated 06/26/2022 >60  >60 mL/min Final   Comment: (NOTE) Calculated using the CKD-EPI Creatinine Equation (2021)    Anion gap 06/26/2022 8  5 - 15 Final   Performed at [facility], [address] [place], [place] [address]   WBC 06/26/2022 7.9  4.0 - 10.5 K/uL Final   RBC 06/26/2022 4.28  3.87 - 5.11 MIL/uL Final   Hemoglobin 06/26/2022 13.4  12.0 - 15.0 g/dL Final   HCT 44/06/4740 41.4  36.0 - 46.0 % Final   MCV 06/26/2022 96.7  80.0 - 100.0 fL Final   MCH 06/26/2022 31.3  26.0 - 34.0 pg Final   MCHC 06/26/2022 32.4  30.0 - 36.0 g/dL Final   RDW 59/56/3875 13.5  11.5 - 15.5 % Final   Platelets 06/26/2022 322  150 - 400 K/uL Final   nRBC 06/26/2022 0.0  0.0 - 0.2 % Final   Neutrophils Relative % 06/26/2022 59  % Final   Neutro Abs 06/26/2022 4.6  1.7 - 7.7 K/uL Final   Lymphocytes Relative 06/26/2022 29  % Final   Lymphs Abs 06/26/2022 2.3  0.7 - 4.0 K/uL Final   Monocytes Relative 06/26/2022 8  % Final   Monocytes Absolute 06/26/2022 0.6  0.1 - 1.0 K/uL Final   Eosinophils Relative 06/26/2022 3  % Final   Eosinophils Absolute 06/26/2022 0.2  0.0 - 0.5 K/uL Final   Basophils Relative 06/26/2022 1  % Final   Basophils Absolute 06/26/2022 0.1  0.0 - 0.1 K/uL Final   Immature Granulocytes 06/26/2022 0  % Final   Abs Immature Granulocytes 06/26/2022 0.03  0.00 - 0.07 K/uL Final   Performed at [facility], [address], [place], [place] [address]    RADIOGRAPHIC STUDIES: I have personally reviewed the radiological images as listed and agree with the findings  in the report  No results  found.  ASSESSMENT/PLAN 77 y.o. nonsmoking female with newly diagnosed lung cancer.  Medical history notable for arthritis, cataracts, chronic back pain, colon polyps, hypertension, nephrolithiasis, neuropathy, shoulder arthroplasty, mild cognitive impairment  Adenocarcinoma, Right lung, lower lobe, Stage 1A3 (T1c N0 M0):    March 07 2022:  CT PA demonstrated 2.9 cm spiculated mass superior segment RLL  April 20 2022:  PET CT showed 3 cm RLL mass (SUV 7.4).  No hypermetabolic mediastinal or hilar adenopathy. Subtle FDG uptake in both adrenal glands without underlying nodule or mass on CT imaging as such considered indeterminate.  May 03 2022:  PFTs. FEV1 158% FVC 150% FEV1/FVC 104%. DLCO 88.2%   June 11, 2022: CT-guided biopsy right lower lobe lung mass. Adenocarcinoma well-differentiated. IHC demonstrate cytokeratin 7 and TT F1 positivity   June 21 2022:  MRI brain negative for metastasis   Therapeutics:  Since patient has excellent performance status, good PFT's, disease which appears to be amenable to resection and good family support, recommended consultation with Thoracic surgery for consideration of resection.   July 26 2022- Right robotic thoracoscopy with mediastinal and regional lymphadenectomy  August 06, 2022: Recovering well from surgery.  Pathology not available from resection.  Will see back in approximately 2 weeks to discuss the role if any for adjuvant therapy  Mild cognitive impairment:  This may predispose her to development of postoperative delirium  June 21 2022:  MRI brain --punctate focus of enhancement within the mid to posterior right frontal lobe white matter.  Chronic small vessel ischemic changes which are minimal   June 28 2022:  MRI findings suggest that patient may have had a vascular event that caused the memory impairment rather than a progressive disorder     Cancer Staging  Lung cancer Staging form: Lung, AJCC 8th Edition - Clinical  stage from 06/19/2022: Stage IA3 (cT1c, cN0, cM0) - Signed by Loni Muse, MD on 06/19/2022 Histopathologic type: Adenocarcinoma, NOS Stage prefix: Initial diagnosis Histologic grade (G): G1 Histologic grading system: 4 grade system    No problem-specific Assessment & Plan notes found for this encounter.    No orders of the defined types were placed in this encounter.   20  minutes was spent in patient care.  This included time spent preparing to see the patient (e.g., review of tests), obtaining and/or reviewing separately obtained history, counseling and educating the patient/family/caregiver, ordering medications, tests, or procedures; documenting clinical information in the electronic or other health record, independently interpreting results and communicating results to the patient/family/caregiver as well as coordination of care.       All questions were answered. The patient knows to call the clinic with any problems, questions or concerns.  This note was electronically signed.    Loni Muse, MD  08/06/2022 1:42 PM

## 2022-08-07 ENCOUNTER — Telehealth: Payer: Self-pay | Admitting: Oncology

## 2022-08-07 NOTE — Telephone Encounter (Signed)
Patient has been scheduled for follow-up visit per 08/06/22 LOS.

## 2022-08-13 DIAGNOSIS — Z09 Encounter for follow-up examination after completed treatment for conditions other than malignant neoplasm: Secondary | ICD-10-CM | POA: Insufficient documentation

## 2022-08-14 DIAGNOSIS — J90 Pleural effusion, not elsewhere classified: Secondary | ICD-10-CM | POA: Diagnosis not present

## 2022-08-14 DIAGNOSIS — Z9889 Other specified postprocedural states: Secondary | ICD-10-CM | POA: Diagnosis not present

## 2022-08-14 DIAGNOSIS — D491 Neoplasm of unspecified behavior of respiratory system: Secondary | ICD-10-CM | POA: Diagnosis not present

## 2022-09-04 ENCOUNTER — Other Ambulatory Visit (HOSPITAL_COMMUNITY): Payer: Self-pay

## 2022-09-04 ENCOUNTER — Inpatient Hospital Stay: Payer: Medicare Other | Attending: Oncology | Admitting: Oncology

## 2022-09-04 ENCOUNTER — Telehealth: Payer: Self-pay | Admitting: Pharmacy Technician

## 2022-09-04 ENCOUNTER — Telehealth: Payer: Self-pay

## 2022-09-04 ENCOUNTER — Inpatient Hospital Stay: Payer: Medicare Other

## 2022-09-04 VITALS — BP 139/92 | HR 62 | Temp 97.7°F | Resp 16 | Ht 64.0 in | Wt 187.4 lb

## 2022-09-04 DIAGNOSIS — Z09 Encounter for follow-up examination after completed treatment for conditions other than malignant neoplasm: Secondary | ICD-10-CM | POA: Diagnosis not present

## 2022-09-04 DIAGNOSIS — C3431 Malignant neoplasm of lower lobe, right bronchus or lung: Secondary | ICD-10-CM | POA: Diagnosis not present

## 2022-09-04 LAB — COMPREHENSIVE METABOLIC PANEL
ALT: 15 U/L (ref 0–44)
AST: 14 U/L — ABNORMAL LOW (ref 15–41)
Albumin: 4.3 g/dL (ref 3.5–5.0)
Alkaline Phosphatase: 95 U/L (ref 38–126)
Anion gap: 8 (ref 5–15)
BUN: 29 mg/dL — ABNORMAL HIGH (ref 8–23)
CO2: 25 mmol/L (ref 22–32)
Calcium: 9.3 mg/dL (ref 8.9–10.3)
Chloride: 106 mmol/L (ref 98–111)
Creatinine, Ser: 0.75 mg/dL (ref 0.44–1.00)
GFR, Estimated: >60 mL/min (ref >60)
Glucose, Bld: 100 mg/dL — ABNORMAL HIGH (ref 70–99)
Potassium: 4 mmol/L (ref 3.5–5.1)
Sodium: 139 mmol/L (ref 135–145)
Total Bilirubin: 0.6 mg/dL (ref 0.3–1.2)
Total Protein: 7.5 g/dL (ref 6.5–8.1)

## 2022-09-04 LAB — CBC WITH DIFFERENTIAL/PLATELET
Abs Immature Granulocytes: 0.03 10*3/uL (ref 0.00–0.07)
Basophils Absolute: 0.1 10*3/uL (ref 0.0–0.1)
Basophils Relative: 1 %
Eosinophils Absolute: 0.2 10*3/uL (ref 0.0–0.5)
Eosinophils Relative: 3 %
HCT: 43.1 % (ref 36.0–46.0)
Hemoglobin: 14.6 g/dL (ref 12.0–15.0)
Immature Granulocytes: 0 %
Lymphocytes Relative: 28 %
Lymphs Abs: 2.2 10*3/uL (ref 0.7–4.0)
MCH: 31.9 pg (ref 26.0–34.0)
MCHC: 33.9 g/dL (ref 30.0–36.0)
MCV: 94.3 fL (ref 80.0–100.0)
Monocytes Absolute: 0.8 10*3/uL (ref 0.1–1.0)
Monocytes Relative: 9 %
Neutro Abs: 4.7 10*3/uL (ref 1.7–7.7)
Neutrophils Relative %: 59 %
Platelets: 297 10*3/uL (ref 150–400)
RBC: 4.57 MIL/uL (ref 3.87–5.11)
RDW: 13.4 % (ref 11.5–15.5)
WBC: 8 10*3/uL (ref 4.0–10.5)
nRBC: 0 % (ref 0.0–0.2)

## 2022-09-04 MED ORDER — OSIMERTINIB MESYLATE 80 MG PO TABS: 80.0000 mg | ORAL_TABLET | Freq: Every day | ORAL | 5 refills | Status: AC

## 2022-09-04 NOTE — Telephone Encounter (Addendum)
Oral Oncology Patient Advocate Encounter   Received notification that prior authorization for Tagrisso is required.   PA submitted on 09/04/22 Key [id number] Status is pending     Jinger Neighbors, CPhT-Adv Oncology Pharmacy Patient Advocate [facility] Direct Number: [phone]  Fax: [phone]\

## 2022-09-04 NOTE — Progress Notes (Signed)
[facility] Cancer Initial Visit:  Patient Care Team: Philemon Kingdom, MD as PCP - General (Internal Medicine)  CHIEF COMPLAINTS/PURPOSE OF CONSULTATION:  Oncology History  Lung cancer (HCC)  06/19/2022 Initial Diagnosis   Lung cancer (HCC)   06/19/2022 Cancer Staging   Staging form: Lung, AJCC 8th Edition - Clinical stage from 06/19/2022: Stage IA3 (cT1c, cN0, cM0) - Signed by Loni Muse, MD on 06/19/2022 Histopathologic type: Adenocarcinoma, NOS Stage prefix: Initial diagnosis Histologic grade (G): G1 Histologic grading system: 4 grade system   08/13/2022 Cancer Staging   Staging form: Lung, AJCC 8th Edition - Pathologic stage from 08/13/2022: Stage IB (pT2a, pN0, cM0) - Signed by Loni Muse, MD on 08/13/2022 Histopathologic type: Adenocarcinoma, NOS     HISTORY OF PRESENTING ILLNESS: [name] 77 y.o. female is here because of lung cancer Medical history notable for arthritis, cataracts, chronic back pain, colon polyps, hypertension, nephrolithiasis, neuropathy, shoulder arthroplasty, mild cognitive impairment  March 07, 2022: SOB and with cough.  CT PA.  Underlying mild emphysematous changes. Spiculated appearing 2.9 x 2.7 cm mass in the superior segment of the right  lower lobe is worrisome for neoplasm. No other pulmonary lesions or pulmonary nodules. No pleural effusions.   April 20, 2022: PET/CT.  3 cm right lower lobe pulmonary mass (SUV 7.4).  No hypermetabolic mediastinal or hilar adenopathy.  Subtle FDG uptake in both adrenal glands without underlying nodule or mass on CT imaging as such considered indeterminate.  May 03, 2022: PFTs.  FEV1 158% FVC 150% FEV1/FVC 104%.  DLCO 88.2%  May 09, 2022: Bronchoscopy with lavage, cytology and biopsy nondiagnostic  June 11, 2022: CT-guided biopsy right lower lobe lung mass.  Adenocarcinoma well-differentiated.  IHC demonstrate cytokeratin 7 and TT F1 positivity  June 19 2022:  [facility] Medical Oncology Consult  Patient feels well.  She is able to do pretty much what she wants to do.  Is a bit fatigued.    Social:  Widowed.  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  Lives by self.  No history of tobacco use.  EtOH none.    June 21, 2022: MRI of brain--punctate focus of enhancement within the mid to posterior right frontal lobe white matter.  Chronic small vessel ischemic changes which are minimal  June 28, 2022: Scheduled follow-up for management of lung cancer.  Reviewed results of MRI with patient.    WBC 7.9 hemoglobin 13.4 platelet count 322; 59 segs 29 lymphs 8 monos 3 eos 1 basophil CMP notable for BUN of 28 creatinine 0.94.  July 11, 2022: Thoracic surgery consult  July 26 2022:  Right - ROBOTIC XI THORACOSCOPY, SURGICAL; WITH LOBECTOMY (SINGLE LOBE) BRONCHOSCPY, RIGID OR FLEXIBLE, W/FLUORO; WITH THERAPEUTIC ASPIRATION OF TRACHEOBRONCHIAL TREE, INITIAL Right - ROBOTIC XI THORACOSCOPY, SURGICAL; WITH MEDIASTINAL & REGIONAL LYMPHADENECTOMY   August 06 2022:    Has made a good postop recovery.  Will need final pathology from [facility].  To be seen for surgical follow up in 2 weeks.    Sep 04 2022:  Scheduled follow up regarding lung cancer.  Reviewed results of molecular studies with patient.   Not at 100% but active taking herself and household.   Tempus xT revealed Gene: 3236^EGFR^HGNC  Variant: [id number].  MSI Stable, PDL-1 negative  No mutations in  KRAS, BRAF, ALK, ROS1, RET, MET, ERBB2 (HER2).  Reviewed results of testing with patient.  Discussed use of adjuvant Osimertinib.     Review of Systems  Constitutional:  Negative for appetite change, chills, fatigue, fever and unexpected weight change.  HENT:   Negative for lump/mass, mouth sores, nosebleeds, sore throat, tinnitus, trouble swallowing and voice change.   Eyes:  Negative for eye problems and icterus.       Vision changes:  None  Respiratory:  Negative for chest tightness,  hemoptysis, shortness of breath and wheezing.        Slight cough  Cardiovascular:  Negative for chest pain, leg swelling and palpitations.       PND:  none Orthopnea:  none  Gastrointestinal:  Negative for abdominal pain, blood in stool, constipation, diarrhea, nausea and vomiting.  Endocrine: Negative for hot flashes.       Cold intolerance:  none Heat intolerance:  none  Genitourinary:  Negative for bladder incontinence, difficulty urinating, dysuria, frequency, hematuria and nocturia.   Musculoskeletal:  Positive for arthralgias and back pain. Negative for gait problem, myalgias, neck pain and neck stiffness.  Skin:  Negative for itching, rash and wound.  Neurological:  Negative for dizziness, extremity weakness, gait problem, headaches, light-headedness, numbness, seizures and speech difficulty.  Hematological:  Negative for adenopathy. Does not bruise/bleed easily.  Psychiatric/Behavioral:  Negative for sleep disturbance and suicidal ideas. The patient is not nervous/anxious.     MEDICAL HISTORY: Past Medical History:  Diagnosis Date   Arthritis    Cataracts, bilateral    immature   Chronic back pain    spondylolisthesis   Complication of anesthesia    unable to get up due dizziness,nausea- stayed overnite   Constipation    takes Colace daily   Depression    but not on any meds   History of colon polyps    benign one time and precancerous another   History of colon polyps    History of kidney stones 12/2015   Hyperlipidemia    takes Pravastatin daily   Hypertension    takes Losartan daily   Hypothyroidism    takes Synthroid daily   Kidney stones    Numbness    tingling/right lower leg    OA (osteoarthritis)    PONV (postoperative nausea and vomiting)    Shoulder arthritis    Right    SURGICAL HISTORY: Past Surgical History:  Procedure Laterality Date   ABDOMINAL HYSTERECTOMY  2012   bladder tack   BACK SURGERY     BRONCHOSCOPY     COLONOSCOPY  2014    Polyps   LUNG BIOPSY     MAXIMUM ACCESS (MAS)POSTERIOR LUMBAR INTERBODY FUSION (PLIF) 1 LEVEL N/A 07/19/2016   Procedure: LUMBAR FOUR-FIVE MAXIMUM ACCESS (MAS) POSTERIOR LUMBAR INTERBODY FUSION (PLIF), INSTRUMENTED LUMBAR THREE-FIVE;  Surgeon: Tia Alert, MD;  Location: [facility] OR;  Service: Neurosurgery;  Laterality: N/A;   REVERSE SHOULDER ARTHROPLASTY Right 06/13/2017   Procedure: Conversion of right total shoulder arthroplasty to reverse shoulder arthroplasty;  Surgeon: Francena Hanly, MD;  Location: [facility] OR;  Service: Orthopedics;  Laterality: Right;   SHOULDER SURGERY Left 2004   arthroscopy   SHOULDER SURGERY Right 06/13/2017    Conversion of right total shoulder arthroplasty to reverse shoulder arthroplasty (Right Shoulder)   TOTAL SHOULDER ARTHROPLASTY Left 01/19/2016   Procedure: TOTAL SHOULDER ARTHROPLASTY;  Surgeon: Francena Hanly, MD;  Location: [facility] OR;  Service: Orthopedics;  Laterality: Left;   TOTAL SHOULDER ARTHROPLASTY Right 04/18/2017   Procedure: RIGHT TOTAL SHOULDER ARTHROPLASTY;  Surgeon: Francena Hanly, MD;  Location: [facility] OR;  Service: Orthopedics;  Laterality: Right;   TUBAL LIGATION  02/1983  SOCIAL HISTORY: Social History   Socioeconomic History   Marital status: Widowed    Spouse name: Not on file   Number of children: Not on file   Years of education: Not on file   Highest education level: Not on file  Occupational History   Not on file  Tobacco Use   Smoking status: Never    Passive exposure: Past   Smokeless tobacco: Never  Vaping Use   Vaping Use: Never used  Substance and Sexual Activity   Alcohol use: Yes    Alcohol/week: 0.0 standard drinks of alcohol    Comment: rarely   Drug use: No   Sexual activity: Not on file  Other Topics Concern   Not on file  Social History Narrative   Not on file   Social Determinants of Health   Financial Resource Strain: Not on file  Food Insecurity: No Food Insecurity (06/19/2022)   Hunger Vital Sign    Worried  About Running Out of Food in the Last Year: Never true    Ran Out of Food in the Last Year: Never true  Transportation Needs: No Transportation Needs (06/19/2022)   PRAPARE - Administrator, Civil Service (Medical): No    Lack of Transportation (Non-Medical): No  Physical Activity: Not on file  Stress: Not on file  Social Connections: Not on file  Intimate Partner Violence: Not At Risk (06/19/2022)   Humiliation, Afraid, Rape, and Kick questionnaire    Fear of Current or Ex-Partner: No    Emotionally Abused: No    Physically Abused: No    Sexually Abused: No    FAMILY HISTORY Family History  Problem Relation Age of Onset   Lung cancer Mother    Lung cancer Father    Alcoholism Father    Alcoholism Brother    Skin cancer Daughter    Memory loss Half-Brother    Dementia Neg Hx     ALLERGIES:  has No Known Allergies.  MEDICATIONS:  Current Outpatient Medications  Medication Sig Dispense Refill   acetaminophen (TYLENOL) 650 MG CR tablet Take 650-1,300 mg by mouth 3 (three) times daily as needed for pain.      Calcium-Vitamin D-Vitamin K 650-12.5-40 MG-MCG-MCG CHEW Chew by mouth.     cholecalciferol (VITAMIN D3) 25 MCG (1000 UNIT) tablet Take 1,000 Units by mouth daily.     COLLAGEN PO Take by mouth. Takes daily     Cyanocobalamin (B-12 PO) Take by mouth.     diclofenac sodium (VOLTAREN) 1 % GEL Apply 1 application topically 3 (three) times daily as needed (joint pain).     donepezil (ARICEPT) 5 MG tablet Take 1 tablet (5 mg total) by mouth in the morning. 90 tablet 3   L-LYSINE PO Take 0.5 tablets by mouth daily.      levothyroxine (SYNTHROID, LEVOTHROID) 50 MCG tablet Take 50 mcg by mouth daily before breakfast.      losartan (COZAAR) 25 MG tablet Take 25 mg by mouth once daily  0   magnesium 30 MG tablet Take 30 mg by mouth as needed.     Melatonin 10 MG TABS Take by mouth.     meloxicam (MOBIC) 15 MG tablet Take 1 tablet by mouth daily.     Omega-3 Fatty Acids  (FISH OIL) 1200 MG CAPS Take 1,200 mg by mouth every evening.     oxyCODONE (OXY IR/ROXICODONE) 5 MG immediate release tablet Take by mouth.     pravastatin (PRAVACHOL) 40 MG  tablet Take 40 mg by mouth at bedtime.      QC STOOL SOFTENER 100 MG capsule Take 100 mg by mouth 2 (two) times daily as needed.     Current Facility-Administered Medications  Medication Dose Route Frequency Provider Last Rate Last Admin   0.9 %  sodium chloride infusion  500 mL Intravenous Once Lynann Bologna, MD        PHYSICAL EXAMINATION:  ECOG PERFORMANCE STATUS: 0 - Asymptomatic   There were no vitals filed for this visit.   There were no vitals filed for this visit.     Physical Exam Vitals and nursing note reviewed.  Constitutional:      General: She is not in acute distress.    Appearance: Normal appearance. She is normal weight. She is not ill-appearing, toxic-appearing or diaphoretic.     Comments: Here alone.  Comfortable  HENT:     Head: Normocephalic and atraumatic.     Right Ear: External ear normal.     Left Ear: External ear normal.     Nose: Nose normal. No congestion or rhinorrhea.  Eyes:     General: No scleral icterus.    Extraocular Movements: Extraocular movements intact.     Conjunctiva/sclera: Conjunctivae normal.     Pupils: Pupils are equal, round, and reactive to light.  Cardiovascular:     Rate and Rhythm: Normal rate and regular rhythm.     Heart sounds: No murmur heard.    No friction rub. No gallop.  Pulmonary:     Effort: Pulmonary effort is normal. No respiratory distress.     Breath sounds: No stridor. No wheezing or rhonchi.  Abdominal:     General: Bowel sounds are normal.     Palpations: Abdomen is soft.     Tenderness: There is no abdominal tenderness. There is no guarding or rebound.  Musculoskeletal:        General: No swelling, tenderness or deformity.     Cervical back: Normal range of motion and neck supple. No rigidity or tenderness.     Right lower  leg: No edema.     Left lower leg: No edema.  Lymphadenopathy:     Head:     Right side of head: No submental, submandibular, tonsillar, preauricular, posterior auricular or occipital adenopathy.     Left side of head: No submental, submandibular, tonsillar, preauricular, posterior auricular or occipital adenopathy.     Cervical: No cervical adenopathy.     Right cervical: No superficial, deep or posterior cervical adenopathy.    Left cervical: No superficial, deep or posterior cervical adenopathy.     Upper Body:     Right upper body: No supraclavicular, axillary, pectoral or epitrochlear adenopathy.     Left upper body: No supraclavicular, axillary, pectoral or epitrochlear adenopathy.  Skin:    General: Skin is warm.     Coloration: Skin is not jaundiced or pale.     Findings: No bruising or erythema.  Neurological:     General: No focal deficit present.     Mental Status: She is alert and oriented to person, place, and time. Mental status is at baseline.     Cranial Nerves: No cranial nerve deficit.     Motor: No weakness.     Gait: Gait normal.  Psychiatric:        Mood and Affect: Mood normal.        Behavior: Behavior normal.        Thought Content: Thought content  normal.        Judgment: Judgment normal.      LABORATORY DATA: I have personally reviewed the data as listed:  No visits with results within 1 Month(s) from this visit.  Latest known visit with results is:  Appointment on 06/26/2022  Component Date Value Ref Range Status   CEA 06/26/2022 5.4 (H)  0.0 - 4.7 ng/mL Final   Comment: (NOTE)                             Nonsmokers          <3.9                             Smokers             <5.6 [employer] Electrochemiluminescence Immunoassay (ECLIA) Values obtained with different assay methods or kits cannot be used interchangeably.  Results cannot be interpreted as absolute evidence of the presence or absence of malignant disease. Performed At: [facility] [address] [place], [place] [id number] Jolene Schimke MD [id number]    Sodium 06/26/2022 142  135 - 145 mmol/L Final   Potassium 06/26/2022 3.8  3.5 - 5.1 mmol/L Final   Chloride 06/26/2022 108  98 - 111 mmol/L Final   CO2 06/26/2022 26  22 - 32 mmol/L Final   Glucose, Bld 06/26/2022 95  70 - 99 mg/dL Final   Glucose reference range applies only to samples taken after fasting for at least 8 hours.   BUN 06/26/2022 28 (H)  8 - 23 mg/dL Final   Creatinine, Ser 06/26/2022 0.94  0.44 - 1.00 mg/dL Final   Calcium 95/63/8756 9.3  8.9 - 10.3 mg/dL Final   Total Protein 43/32/9518 7.0  6.5 - 8.1 g/dL Final   Albumin 84/16/6063 4.1  3.5 - 5.0 g/dL Final   AST 01/60/1093 18  15 - 41 U/L Final   ALT 06/26/2022 13  0 - 44 U/L Final   Alkaline Phosphatase 06/26/2022 83  38 - 126 U/L Final   Total Bilirubin 06/26/2022 0.7  0.3 - 1.2 mg/dL Final   GFR, Estimated 06/26/2022 >60  >60 mL/min Final   Comment: (NOTE) Calculated using the CKD-EPI Creatinine Equation (2021)    Anion gap 06/26/2022 8  5 - 15 Final   Performed at [facility], [address], [place], [place] [address]   WBC 06/26/2022 7.9  4.0 - 10.5 K/uL Final   RBC 06/26/2022 4.28  3.87 - 5.11 MIL/uL Final   Hemoglobin 06/26/2022 13.4  12.0 - 15.0 g/dL Final   HCT 32/20/2542 41.4  36.0 - 46.0 % Final   MCV 06/26/2022 96.7  80.0 - 100.0 fL Final   MCH 06/26/2022 31.3  26.0 - 34.0 pg Final   MCHC 06/26/2022 32.4  30.0 - 36.0 g/dL Final   RDW 70/62/3762 13.5  11.5 - 15.5 % Final   Platelets 06/26/2022 322  150 - 400 K/uL Final   nRBC 06/26/2022 0.0  0.0 - 0.2 % Final   Neutrophils Relative % 06/26/2022 59  % Final   Neutro Abs 06/26/2022 4.6  1.7 - 7.7 K/uL Final   Lymphocytes Relative 06/26/2022 29  % Final   Lymphs Abs 06/26/2022 2.3  0.7 - 4.0 K/uL Final   Monocytes Relative 06/26/2022 8  % Final   Monocytes Absolute 06/26/2022 0.6  0.1 - 1.0 K/uL  Final   Eosinophils Relative 06/26/2022  3  % Final   Eosinophils Absolute 06/26/2022 0.2  0.0 - 0.5 K/uL Final   Basophils Relative 06/26/2022 1  % Final   Basophils Absolute 06/26/2022 0.1  0.0 - 0.1 K/uL Final   Immature Granulocytes 06/26/2022 0  % Final   Abs Immature Granulocytes 06/26/2022 0.03  0.00 - 0.07 K/uL Final   Performed at [facility], [address], [place], [place] [address]    RADIOGRAPHIC STUDIES: I have personally reviewed the radiological images as listed and agree with the findings in the report  No results found.  ASSESSMENT/PLAN 77 y.o. nonsmoking female with newly diagnosed lung cancer.  Medical history notable for arthritis, cataracts, chronic back pain, colon polyps, hypertension, nephrolithiasis, neuropathy, shoulder arthroplasty, mild cognitive impairment  Adenocarcinoma, Right lung, lower lobe, Stage IB (T2 N0 M0) EGFR  [id number].  MSI Stable, PDL-1 negative  No mutations in  KRAS, BRAF, ALK, ROS1, RET, MET, ERBB2 (HER2).   March 07 2022:  CT PA demonstrated 2.9 cm spiculated mass superior segment RLL  April 20 2022:  PET CT showed 3 cm RLL mass (SUV 7.4).  No hypermetabolic mediastinal or hilar adenopathy. Subtle FDG uptake in both adrenal glands without underlying nodule or mass on CT imaging as such considered indeterminate.  May 03 2022:  PFTs. FEV1 158% FVC 150% FEV1/FVC 104%. DLCO 88.2%   June 11, 2022: CT-guided biopsy right lower lobe lung mass. Adenocarcinoma well-differentiated. IHC demonstrate cytokeratin 7 and TT F1 positivity   June 21 2022:  MRI brain negative for metastasis   Therapeutics:  Since patient has excellent performance status, good PFT's, disease which appears to be amenable to resection and good family support, recommended consultation with Thoracic surgery for consideration of resection.   July 26 2022- Right robotic thoracoscopy with mediastinal and regional lymphadenectomy  August 06, 2022: Recovering well from surgery.     Sep 04 2022- Given stage, and results of molecular studies recommended use of adjuvant Osimertinib  Chemotherapy Risks:  Discussed the potential side effects of chemotherapy with the patient.  These include but are not limited to:  Fatigue, Hair loss, low blood counts (anemia, thrombocytopenia) that may necessitate transfusion, bleeding, infection, nausea/ vomiting/Appetite changes/ Constipation/ Diarrhea, mucositis, Neuropathy/ neurologic problems, skin and nail changes such as dry skin and color change, Urine and bladder changes and kidney problems, weight changes, mood changes, decreased libido/fertility problems, damage to heart and lungs.  Some of these side effects can be life threatening, may be permanent and can result in hospitalization and/or death.  In shared decision making patient has agreed to proceed with chemotherapy.  Will refer patient for formal chemotherapy teaching.    Mild cognitive impairment:  This may predispose her to development of postoperative delirium  June 21 2022:  MRI brain --punctate focus of enhancement within the mid to posterior right frontal lobe white matter.  Chronic small vessel ischemic changes which are minimal   June 28 2022:  MRI findings suggest that patient may have had a vascular event that caused the memory impairment rather than a progressive disorder     Cancer Staging  Lung cancer [facility]) Staging form: Lung, AJCC 8th Edition - Clinical stage from 06/19/2022: Stage IA3 (cT1c, cN0, cM0) - Signed by Loni Muse, MD on 06/19/2022 Histopathologic type: Adenocarcinoma, NOS Stage prefix: Initial diagnosis Histologic grade (G): G1 Histologic grading system: 4 grade system - Pathologic stage from 08/13/2022: Stage IB (pT2a, pN0, cM0) - Signed  by Loni Muse, MD on 08/13/2022 Histopathologic type: Adenocarcinoma, NOS    No problem-specific Assessment & Plan notes found for this encounter.    No orders of the defined types were  placed in this encounter.   30  minutes was spent in patient care.  This included time spent preparing to see the patient (e.g., review of tests), obtaining and/or reviewing separately obtained history, counseling and educating the patient/family/caregiver, ordering medications, tests, or procedures; documenting clinical information in the electronic or other health record, independently interpreting results and communicating results to the patient/family/caregiver as well as coordination of care.       All questions were answered. The patient knows to call the clinic with any problems, questions or concerns.  This note was electronically signed.    Loni Muse, MD  09/04/2022 11:25 AM

## 2022-09-04 NOTE — Patient Instructions (Signed)
Brand Names: [place] Tagrisso  Brand Names: [place] Tagrisso  What is this drug used for? It is used to treat lung cancer.  What do I need to tell my doctor BEFORE I take this drug? If you are allergic to this drug; any part of this drug; or any other drugs, foods, or substances. Tell your doctor about the allergy and what signs you had. If you are taking any drugs that can cause a certain type of heartbeat that is not normal (prolonged QT interval). There are many drugs that can do this. Ask your doctor or pharmacist if you are not sure. If you take any drugs (prescription or OTC, natural products, vitamins) that must not be taken with this drug, like certain drugs that are used for HIV, infections, or seizures. There are many drugs that must not be taken with this drug. If you are breast-feeding. Do not breast-feed while you take this drug and for 2 weeks after your last dose. This is not a list of all drugs or health problems that interact with this drug. Tell your doctor and pharmacist about all of your drugs (prescription or OTC, natural products, vitamins) and health problems. You must check to make sure that it is safe for you to take this drug with all of your drugs and health problems. Do not start, stop, or change the dose of any drug without checking with your doctor.  What are some things I need to know or do while I take this drug? Tell all of your health care providers that you take this drug. This includes your doctors, nurses, pharmacists, and dentists. Have your blood work and other lab tests checked as you have been told by your doctor. Some people may also need to have heart rhythm checked with an ECG. If you have high blood sugar (diabetes), you will need to watch your blood sugar closely. Tell your doctor if you get signs of high blood sugar like confusion, feeling sleepy, unusual thirst or hunger, passing urine more often, flushing, fast breathing, or breath that smells like  fruit. If you are 77 or older, use this drug with care. You could have more side effects. This drug may affect fertility. Fertility problems may lead to not being able to get pregnant or father a child. In females, this may go back to normal. In males, it is not known if this will go back to normal. If you have questions, talk with the doctor. This drug may cause harm to an unborn baby. A pregnancy test will be done before you start this drug to show that you are NOT pregnant. If you or your sex partner may become pregnant, you must use birth control while taking this drug and for some time after the last dose. Ask your doctor how long to use birth control. If you or your sex partner gets pregnant, call your doctor right away.  What are some side effects that I need to call my doctor about right away? WARNING/CAUTION: Even though it may be rare, some people may have very bad and sometimes deadly side effects when taking a drug. Tell your doctor or get medical help right away if you have any of the following signs or symptoms that may be related to a very bad side effect: Signs of an allergic reaction, like rash; hives; itching; red, swollen, blistered, or peeling skin with or without fever; wheezing; tightness in the chest or throat; trouble breathing, swallowing, or talking; unusual hoarseness; or  swelling of the mouth, face, lips, tongue, or throat. Signs of electrolyte problems like mood changes; confusion; muscle pain, cramps, or spasms; weakness; shakiness; change in balance; an abnormal heartbeat; seizures; loss of appetite; or severe upset stomach or throwing up. Dizziness. Change in eyesight, eye pain, or severe eye irritation. If bright lights bother your eyes. Watery eyes. Purple spots; red skin of the lower arms, lower legs, or buttocks that does not fade when pressed; or hives that do not go away within 24 hours and look bruised. Lung problems have happened with this drug. Sometimes, this  may be deadly. Signs of lung problems may be like signs of lung cancer. Call your doctor right away if you have any new or worse signs of lung or breathing problems like shortness of breath or other trouble breathing, cough, or fever. Heart failure has rarely happened in people taking this drug. Sometimes, this has been deadly. Call your doctor right away if you have shortness of breath, a big weight gain, swelling in the arms or legs, or bulging neck veins. A type of abnormal heartbeat (prolonged QT interval) can happen with this drug. Call your doctor right away if you have a fast heartbeat, a heartbeat that does not feel normal, or if you pass out. Low blood cell counts have happened with this drug. If blood cell counts get very low, this can lead to bleeding problems, infections, or anemia. Rarely, a blood problem called aplastic anemia has happened. Sometimes, this has been deadly. Call your doctor right away if you have a new fever or fever that does not go away, chills, sore throat, or other signs of infection; any unexplained bruising or bleeding; pale skin; or if you feel very tired or weak. Severe skin reactions have happened with this drug. These have included Stevens-Johnson syndrome (SJS), toxic epidermal necrolysis (TEN), and other severe skin reactions. Get medical help right away if you have signs like red, swollen, blistered, or peeling skin; other skin irritation (with or without fever); red or irritated eyes; or sores in your mouth, throat, nose, or eyes.  What are some other side effects of this drug? All drugs may cause side effects. However, many people have no side effects or only have minor side effects. Call your doctor or get medical help if any of these side effects or any other side effects bother you or do not go away: Dry skin. Change in nails, including pain, redness, swelling, and loss of nails. Constipation, diarrhea, stomach pain, upset stomach, throwing up, or decreased  appetite. Mouth irritation or mouth sores. Headache. Feeling tired or weak. Signs of a common cold. Back, bone, joint, muscle, or neck pain. These are not all of the side effects that may occur. If you have questions about side effects, call your doctor. Call your doctor for medical advice about side effects. You may report side effects to your national health agency.  How is this drug best taken? Use this drug as ordered by your doctor. Read all information given to you. Follow all instructions closely. Take with or without food. Keep taking this drug as you have been told by your doctor or other health care provider, even if you feel well. If you have trouble swallowing, the tablet may be mixed in 2 ounces (60 mL) of water. Do not use carbonated water or other liquids. Stir well until the tablet is in small pieces. Do not crush or heat. Swallow right away. After drinking, rinse the container with 4  to 8 ounces (120 to 240 mL) of water and drink right away. Those who have feeding tubes may use this drug. Use as you have been told. Flush the feeding tube after this drug is given.  What do I do if I miss a dose? Skip the missed dose and go back to your normal time. Do not take 2 doses at the same time or extra doses.  How do I store and/or throw out this drug? Store at room temperature in a dry place. Do not store in a bathroom. Keep all drugs in a safe place. Keep all drugs out of the reach of children and pets. Throw away unused or expired drugs. Do not flush down a toilet or pour down a drain unless you are told to do so. Check with your pharmacist if you have questions about the best way to throw out drugs. There may be drug take-back programs in your area.  General drug facts If your symptoms or health problems do not get better or if they become worse, call your doctor. Do not share your drugs with others and do not take anyone else's drugs. Some drugs may have another patient  information leaflet. If you have any questions about this drug, please talk with your doctor, nurse, pharmacist, or other health care provider. If you think there has been an overdose, call your poison control center or get medical care right away. Be ready to tell or show what was taken, how much, and when it happened.

## 2022-09-05 NOTE — Telephone Encounter (Signed)
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  Labs from 09/04/22 (CBC, CMP) assessed, no interventions needed. Prescription dose and frequency assessed for appropriateness.    Current medication list in Epic reviewed, DDIs with Tagrisso identified:  -donepezil: qt-prolonging agents may be enhanced with two medications although no action needed. EKG monitoring can be considered at patients with high risk for QT interval prolongation.   Evaluated chart and no patient barriers to medication adherence noted.   Patient agreement for treatment documented in MD note on 09/04/2022.  Prescription has been e-scribed to the [facility] for benefits analysis and approval.  Oral Oncology Clinic will continue to follow for insurance authorization, copayment issues, initial counseling and start date.  Bethel Born, PharmD Hematology/Oncology Clinical Pharmacist [facility] [phone] 09/05/2022 8:59 AM

## 2022-09-06 ENCOUNTER — Other Ambulatory Visit (HOSPITAL_COMMUNITY): Payer: Self-pay

## 2022-09-07 NOTE — Progress Notes (Signed)
[facility] Cancer Initial Visit:  Patient Care Team: Philemon Kingdom, MD as PCP - General (Internal Medicine)  CHIEF COMPLAINTS/PURPOSE OF CONSULTATION:  Oncology History  Lung cancer (HCC)  06/19/2022 Initial Diagnosis   Lung cancer (HCC)   06/19/2022 Cancer Staging   Staging form: Lung, AJCC 8th Edition - Clinical stage from 06/19/2022: Stage IA3 (cT1c, cN0, cM0) - Signed by Loni Muse, MD on 06/19/2022 Histopathologic type: Adenocarcinoma, NOS Stage prefix: Initial diagnosis Histologic grade (G): G1 Histologic grading system: 4 grade system   08/13/2022 Cancer Staging   Staging form: Lung, AJCC 8th Edition - Pathologic stage from 08/13/2022: Stage IB (pT2a, pN0, cM0) - Signed by Loni Muse, MD on 08/13/2022 Histopathologic type: Adenocarcinoma, NOS     HISTORY OF PRESENTING ILLNESS: [name] 77 y.o. female is here because of lung cancer Medical history notable for arthritis, cataracts, chronic back pain, colon polyps, hypertension, nephrolithiasis, neuropathy, shoulder arthroplasty, mild cognitive impairment  March 07, 2022: SOB and with cough.  CT PA.  Underlying mild emphysematous changes. Spiculated appearing 2.9 x 2.7 cm mass in the superior segment of the right  lower lobe is worrisome for neoplasm. No other pulmonary lesions or pulmonary nodules. No pleural effusions.   April 20, 2022: PET/CT.  3 cm right lower lobe pulmonary mass (SUV 7.4).  No hypermetabolic mediastinal or hilar adenopathy.  Subtle FDG uptake in both adrenal glands without underlying nodule or mass on CT imaging as such considered indeterminate.  May 03, 2022: PFTs.  FEV1 158% FVC 150% FEV1/FVC 104%.  DLCO 88.2%  May 09, 2022: Bronchoscopy with lavage, cytology and biopsy nondiagnostic  June 11, 2022: CT-guided biopsy right lower lobe lung mass.  Adenocarcinoma well-differentiated.  IHC demonstrate cytokeratin 7 and TT F1 positivity  June 19 2022:  [facility] Medical Oncology Consult  Patient feels well.  She is able to do pretty much what she wants to do.  Is a bit fatigued.    Social:  Widowed.  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  Lives by self.  No history of tobacco use.  EtOH none.    June 21, 2022: MRI of brain--punctate focus of enhancement within the mid to posterior right frontal lobe white matter.  Chronic small vessel ischemic changes which are minimal  June 28, 2022: Scheduled follow-up for management of lung cancer.  Reviewed results of MRI with patient.    WBC 7.9 hemoglobin 13.4 platelet count 322; 59 segs 29 lymphs 8 monos 3 eos 1 basophil CMP notable for BUN of 28 creatinine 0.94.  July 11, 2022: Thoracic surgery consult  July 26 2022:  Right - ROBOTIC XI THORACOSCOPY, SURGICAL; WITH LOBECTOMY (SINGLE LOBE) BRONCHOSCPY, RIGID OR FLEXIBLE, W/FLUORO; WITH THERAPEUTIC ASPIRATION OF TRACHEOBRONCHIAL TREE, INITIAL Right - ROBOTIC XI THORACOSCOPY, SURGICAL; WITH MEDIASTINAL & REGIONAL LYMPHADENECTOMY   August 06 2022:    Has made a good postop recovery.  Will need final pathology from [facility].  To be seen for surgical follow up in 2 weeks.    Sep 04 2022:  Scheduled follow up regarding lung cancer.  Reviewed results of molecular studies with patient.   Not at 100% but active taking herself and household.   Tempus xT revealed Gene: 3236^EGFR^HGNC  Variant: [id number].  MSI Stable, PDL-1 negative  No mutations in  KRAS, BRAF, ALK, ROS1, RET, MET, ERBB2 (HER2).  Reviewed results of testing with patient.  Discussed use of adjuvant Osimertinib.    Sep 11 2022:  Scheduled follow up regarding lung cancer.  Reviewed recent history and pathology with patient and daughter.  The latter is questioning need for adjuvant therapy since the "surgeon never told her additional therapy was needed".  Daughter is also worried about "putting poison in my mother's body".   Review of Systems  Constitutional:  Negative  for appetite change, chills, fatigue, fever and unexpected weight change.  HENT:   Negative for lump/mass, mouth sores, nosebleeds, sore throat, tinnitus, trouble swallowing and voice change.   Eyes:  Negative for eye problems and icterus.       Vision changes:  None  Respiratory:  Negative for chest tightness, hemoptysis, shortness of breath and wheezing.        Slight cough  Cardiovascular:  Negative for chest pain, leg swelling and palpitations.       PND:  none Orthopnea:  none  Gastrointestinal:  Negative for abdominal pain, blood in stool, constipation, diarrhea, nausea and vomiting.  Endocrine: Negative for hot flashes.       Cold intolerance:  none Heat intolerance:  none  Genitourinary:  Negative for bladder incontinence, difficulty urinating, dysuria, frequency, hematuria and nocturia.   Musculoskeletal:  Positive for arthralgias and back pain. Negative for gait problem, myalgias, neck pain and neck stiffness.  Skin:  Negative for itching, rash and wound.  Neurological:  Negative for dizziness, extremity weakness, gait problem, headaches, light-headedness, numbness, seizures and speech difficulty.  Hematological:  Negative for adenopathy. Does not bruise/bleed easily.  Psychiatric/Behavioral:  Negative for sleep disturbance and suicidal ideas. The patient is not nervous/anxious.     MEDICAL HISTORY: Past Medical History:  Diagnosis Date   Arthritis    Cataracts, bilateral    immature   Chronic back pain    spondylolisthesis   Complication of anesthesia    unable to get up due dizziness,nausea- stayed overnite   Constipation    takes Colace daily   Depression    but not on any meds   History of colon polyps    benign one time and precancerous another   History of colon polyps    History of kidney stones 12/2015   Hyperlipidemia    takes Pravastatin daily   Hypertension    takes Losartan daily   Hypothyroidism    takes Synthroid daily   Kidney stones    Numbness     tingling/right lower leg    OA (osteoarthritis)    PONV (postoperative nausea and vomiting)    Shoulder arthritis    Right    SURGICAL HISTORY: Past Surgical History:  Procedure Laterality Date   ABDOMINAL HYSTERECTOMY  2012   bladder tack   BACK SURGERY     BRONCHOSCOPY     COLONOSCOPY  2014   Polyps   LUNG BIOPSY     MAXIMUM ACCESS (MAS)POSTERIOR LUMBAR INTERBODY FUSION (PLIF) 1 LEVEL N/A 07/19/2016   Procedure: LUMBAR FOUR-FIVE MAXIMUM ACCESS (MAS) POSTERIOR LUMBAR INTERBODY FUSION (PLIF), INSTRUMENTED LUMBAR THREE-FIVE;  Surgeon: Tia Alert, MD;  Location: [facility] OR;  Service: Neurosurgery;  Laterality: N/A;   REVERSE SHOULDER ARTHROPLASTY Right 06/13/2017   Procedure: Conversion of right total shoulder arthroplasty to reverse shoulder arthroplasty;  Surgeon: Francena Hanly, MD;  Location: [facility] OR;  Service: Orthopedics;  Laterality: Right;   SHOULDER SURGERY Left 2004   arthroscopy   SHOULDER SURGERY Right 06/13/2017    Conversion of right total shoulder arthroplasty to reverse shoulder arthroplasty (Right Shoulder)   TOTAL SHOULDER ARTHROPLASTY Left 01/19/2016   Procedure: TOTAL  SHOULDER ARTHROPLASTY;  Surgeon: Francena Hanly, MD;  Location[facility];  Service: Orthopedics;  Laterality: Left;   TOTAL SHOULDER ARTHROPLASTY Right 04/18/2017   Procedure: RIGHT TOTAL SHOULDER ARTHROPLASTY;  Surgeon: Francena Hanly, MD;  Location: [facility] OR;  Service: Orthopedics;  Laterality: Right;   TUBAL LIGATION  02/1983    SOCIAL HISTORY: Social History   Socioeconomic History   Marital status: Widowed    Spouse name: Not on file   Number of children: Not on file   Years of education: Not on file   Highest education level: Not on file  Occupational History   Not on file  Tobacco Use   Smoking status: Never    Passive exposure: Past   Smokeless tobacco: Never  Vaping Use   Vaping Use: Never used  Substance and Sexual Activity   Alcohol use: Yes    Alcohol/week: 0.0 standard drinks of  alcohol    Comment: rarely   Drug use: No   Sexual activity: Not on file  Other Topics Concern   Not on file  Social History Narrative   Not on file   Social Determinants of Health   Financial Resource Strain: Not on file  Food Insecurity: No Food Insecurity (06/19/2022)   Hunger Vital Sign    Worried About Running Out of Food in the Last Year: Never true    Ran Out of Food in the Last Year: Never true  Transportation Needs: No Transportation Needs (06/19/2022)   PRAPARE - Administrator, Civil Service (Medical): No    Lack of Transportation (Non-Medical): No  Physical Activity: Not on file  Stress: Not on file  Social Connections: Not on file  Intimate Partner Violence: Not At Risk (06/19/2022)   Humiliation, Afraid, Rape, and Kick questionnaire    Fear of Current or Ex-Partner: No    Emotionally Abused: No    Physically Abused: No    Sexually Abused: No    FAMILY HISTORY Family History  Problem Relation Age of Onset   Lung cancer Mother    Lung cancer Father    Alcoholism Father    Alcoholism Brother    Skin cancer Daughter    Memory loss Half-Brother    Dementia Neg Hx     ALLERGIES:  has No Known Allergies.  MEDICATIONS:  Current Outpatient Medications  Medication Sig Dispense Refill   acetaminophen (TYLENOL) 650 MG CR tablet Take 650-1,300 mg by mouth 3 (three) times daily as needed for pain.      Calcium-Vitamin D-Vitamin K 650-12.5-40 MG-MCG-MCG CHEW Chew by mouth.     cholecalciferol (VITAMIN D3) 25 MCG (1000 UNIT) tablet Take 1,000 Units by mouth daily.     COLLAGEN PO Take by mouth. Takes daily     Cyanocobalamin (B-12 PO) Take by mouth.     diclofenac sodium (VOLTAREN) 1 % GEL Apply 1 application topically 3 (three) times daily as needed (joint pain).     donepezil (ARICEPT) 5 MG tablet Take 1 tablet (5 mg total) by mouth in the morning. 90 tablet 3   L-LYSINE PO Take 0.5 tablets by mouth daily.      levothyroxine (SYNTHROID, LEVOTHROID) 50  MCG tablet Take 50 mcg by mouth daily before breakfast.      losartan (COZAAR) 25 MG tablet Take 25 mg by mouth once daily  0   magnesium 30 MG tablet Take 30 mg by mouth as needed.     Melatonin 10 MG TABS Take by mouth.  meloxicam (MOBIC) 15 MG tablet Take 1 tablet by mouth daily.     Omega-3 Fatty Acids (FISH OIL) 1200 MG CAPS Take 1,200 mg by mouth every evening.     osimertinib mesylate (TAGRISSO) 80 MG tablet Take 1 tablet (80 mg total) by mouth daily. 30 tablet 5   oxyCODONE (OXY IR/ROXICODONE) 5 MG immediate release tablet Take by mouth.     pravastatin (PRAVACHOL) 40 MG tablet Take 40 mg by mouth at bedtime.      QC STOOL SOFTENER 100 MG capsule Take 100 mg by mouth 2 (two) times daily as needed.     Current Facility-Administered Medications  Medication Dose Route Frequency Provider Last Rate Last Admin   0.9 %  sodium chloride infusion  500 mL Intravenous Once Lynann Bologna, MD        PHYSICAL EXAMINATION:  ECOG PERFORMANCE STATUS: 0 - Asymptomatic   Vitals:   09/11/22 1627  BP: 139/86  Pulse: 96  Resp: 18  Temp: 97.9 F (36.6 C)  SpO2: 96%    Filed Weights   09/11/22 1627  Weight: 187 lb 3.2 oz (84.9 kg)      Physical Exam Vitals and nursing note reviewed.  Constitutional:      General: She is not in acute distress.    Appearance: Normal appearance. She is normal weight. She is not ill-appearing, toxic-appearing or diaphoretic.     Comments: Here alone.  Comfortable  HENT:     Head: Normocephalic and atraumatic.     Right Ear: External ear normal.     Left Ear: External ear normal.     Nose: Nose normal. No congestion or rhinorrhea.  Eyes:     General: No scleral icterus.    Extraocular Movements: Extraocular movements intact.     Conjunctiva/sclera: Conjunctivae normal.     Pupils: Pupils are equal, round, and reactive to light.  Cardiovascular:     Rate and Rhythm: Normal rate and regular rhythm.     Heart sounds: No murmur heard.    No  friction rub. No gallop.  Pulmonary:     Effort: Pulmonary effort is normal. No respiratory distress.     Breath sounds: No stridor. No wheezing or rhonchi.  Abdominal:     General: Bowel sounds are normal.     Palpations: Abdomen is soft.     Tenderness: There is no abdominal tenderness. There is no guarding or rebound.  Musculoskeletal:        General: No swelling, tenderness or deformity.     Cervical back: Normal range of motion and neck supple. No rigidity or tenderness.     Right lower leg: No edema.     Left lower leg: No edema.  Lymphadenopathy:     Head:     Right side of head: No submental, submandibular, tonsillar, preauricular, posterior auricular or occipital adenopathy.     Left side of head: No submental, submandibular, tonsillar, preauricular, posterior auricular or occipital adenopathy.     Cervical: No cervical adenopathy.     Right cervical: No superficial, deep or posterior cervical adenopathy.    Left cervical: No superficial, deep or posterior cervical adenopathy.     Upper Body:     Right upper body: No supraclavicular, axillary, pectoral or epitrochlear adenopathy.     Left upper body: No supraclavicular, axillary, pectoral or epitrochlear adenopathy.  Skin:    General: Skin is warm.     Coloration: Skin is not jaundiced or pale.  Findings: No bruising or erythema.  Neurological:     General: No focal deficit present.     Mental Status: She is alert and oriented to person, place, and time. Mental status is at baseline.     Cranial Nerves: No cranial nerve deficit.     Motor: No weakness.     Gait: Gait normal.  Psychiatric:        Mood and Affect: Mood normal.        Behavior: Behavior normal.        Thought Content: Thought content normal.        Judgment: Judgment normal.      LABORATORY DATA: I have personally reviewed the data as listed:  Office Visit on 09/04/2022  Component Date Value Ref Range Status   WBC 09/04/2022 8.0  4.0 - 10.5  K/uL Final   RBC 09/04/2022 4.57  3.87 - 5.11 MIL/uL Final   Hemoglobin 09/04/2022 14.6  12.0 - 15.0 g/dL Final   HCT 40/98/1191 43.1  36.0 - 46.0 % Final   MCV 09/04/2022 94.3  80.0 - 100.0 fL Final   MCH 09/04/2022 31.9  26.0 - 34.0 pg Final   MCHC 09/04/2022 33.9  30.0 - 36.0 g/dL Final   RDW 47/82/9562 13.4  11.5 - 15.5 % Final   Platelets 09/04/2022 297  150 - 400 K/uL Final   nRBC 09/04/2022 0.0  0.0 - 0.2 % Final   Neutrophils Relative % 09/04/2022 59  % Final   Neutro Abs 09/04/2022 4.7  1.7 - 7.7 K/uL Final   Lymphocytes Relative 09/04/2022 28  % Final   Lymphs Abs 09/04/2022 2.2  0.7 - 4.0 K/uL Final   Monocytes Relative 09/04/2022 9  % Final   Monocytes Absolute 09/04/2022 0.8  0.1 - 1.0 K/uL Final   Eosinophils Relative 09/04/2022 3  % Final   Eosinophils Absolute 09/04/2022 0.2  0.0 - 0.5 K/uL Final   Basophils Relative 09/04/2022 1  % Final   Basophils Absolute 09/04/2022 0.1  0.0 - 0.1 K/uL Final   Immature Granulocytes 09/04/2022 0  % Final   Abs Immature Granulocytes 09/04/2022 0.03  0.00 - 0.07 K/uL Final   Performed at [facility], [address] [place], [place] [address]   Sodium 09/04/2022 139  135 - 145 mmol/L Final   Potassium 09/04/2022 4.0  3.5 - 5.1 mmol/L Final   Chloride 09/04/2022 106  98 - 111 mmol/L Final   CO2 09/04/2022 25  22 - 32 mmol/L Final   Glucose, Bld 09/04/2022 100 (H)  70 - 99 mg/dL Final   Glucose reference range applies only to samples taken after fasting for at least 8 hours.   BUN 09/04/2022 29 (H)  8 - 23 mg/dL Final   Creatinine, Ser 09/04/2022 0.75  0.44 - 1.00 mg/dL Final   Calcium 57/84/6962 9.3  8.9 - 10.3 mg/dL Final   Total Protein 95/28/4132 7.5  6.5 - 8.1 g/dL Final   Albumin 44/04/270 4.3  3.5 - 5.0 g/dL Final   AST 53/66/4403 14 (L)  15 - 41 U/L Final   ALT 09/04/2022 15  0 - 44 U/L Final   Alkaline Phosphatase 09/04/2022 95  38 - 126 U/L Final   Total Bilirubin 09/04/2022 0.6  0.3 - 1.2 mg/dL Final    GFR, Estimated 09/04/2022 >60  >60 mL/min Final   Comment: (NOTE) Calculated using the CKD-EPI Creatinine Equation (2021)    Anion gap 09/04/2022 8  5 - 15  Final   Performed at [facility], [address], [place], [place] [address]    RADIOGRAPHIC STUDIES: I have personally reviewed the radiological images as listed and agree with the findings in the report  No results found.  ASSESSMENT/PLAN 77 y.o. nonsmoking female with newly diagnosed lung cancer.  Medical history notable for arthritis, cataracts, chronic back pain, colon polyps, hypertension, nephrolithiasis, neuropathy, shoulder arthroplasty, mild cognitive impairment  Adenocarcinoma, Right lung, lower lobe, Stage IB (T2 N0 M0) EGFR  [id number].  MSI Stable, PDL-1 negative  No mutations in  KRAS, BRAF, ALK, ROS1, RET, MET, ERBB2 (HER2).   March 07 2022:  CT PA demonstrated 2.9 cm spiculated mass superior segment RLL  April 20 2022:  PET CT showed 3 cm RLL mass (SUV 7.4).  No hypermetabolic mediastinal or hilar adenopathy. Subtle FDG uptake in both adrenal glands without underlying nodule or mass on CT imaging as such considered indeterminate.  May 03 2022:  PFTs. FEV1 158% FVC 150% FEV1/FVC 104%. DLCO 88.2%   June 11, 2022: CT-guided biopsy right lower lobe lung mass. Adenocarcinoma well-differentiated. IHC demonstrate cytokeratin 7 and TT F1 positivity   June 21 2022:  MRI brain negative for metastasis   Therapeutics:  Since patient has excellent performance status, good PFT's, disease which appears to be amenable to resection and good family support, recommended consultation with Thoracic surgery for consideration of resection.   July 26 2022- Right robotic thoracoscopy with mediastinal and regional lymphadenectomy  August 06, 2022: Recovering well from surgery.    Sep 04 2022- Given stage, and results of molecular studies recommended use of adjuvant Osimertinib Sep 10 2021- Reviewed  literature results describing risks and benefits of Osimertinib.  Explained that there is not only a statistical benefit, but also a clinical one for administration of adjuvant TKI therapy.  Patient and daughter declined consideration of adjuvant treatment at this time but have agreed to long term follow up  Chemotherapy Risks:  Discussed the potential side effects of chemotherapy with the patient.  These include but are not limited to:  Fatigue, Hair loss, low blood counts (anemia, thrombocytopenia) that may necessitate transfusion, bleeding, infection, nausea/ vomiting/Appetite changes/ Constipation/ Diarrhea, mucositis, Neuropathy/ neurologic problems, skin and nail changes such as dry skin and color change, Urine and bladder changes and kidney problems, weight changes, mood changes, decreased libido/fertility problems, damage to heart and lungs.  Some of these side effects can be life threatening, may be permanent and can result in hospitalization and/or death.  In shared decision making patient has agreed to proceed with chemotherapy.  Will refer patient for formal chemotherapy teaching.    Mild cognitive impairment:  This may predispose her to development of postoperative delirium  June 21 2022:  MRI brain --punctate focus of enhancement within the mid to posterior right frontal lobe white matter.  Chronic small vessel ischemic changes which are minimal   June 28 2022:  MRI findings suggest that patient may have had a vascular event that caused the memory impairment rather than a progressive disorder     Cancer Staging  Lung cancer [facility]) Staging form: Lung, AJCC 8th Edition - Clinical stage from 06/19/2022: Stage IA3 (cT1c, cN0, cM0) - Signed by Loni Muse, MD on 06/19/2022 Histopathologic type: Adenocarcinoma, NOS Stage prefix: Initial diagnosis Histologic grade (G): G1 Histologic grading system: 4 grade system - Pathologic stage from 08/13/2022: Stage IB (pT2a, pN0, cM0) - Signed  by Loni Muse, MD on 08/13/2022 Histopathologic type: Adenocarcinoma, NOS  No problem-specific Assessment & Plan notes found for this encounter.    No orders of the defined types were placed in this encounter.   40  minutes was spent in patient care.  This included time spent preparing to see the patient (e.g., review of tests), obtaining and/or reviewing separately obtained history, counseling and educating the patient/family/caregiver, ordering medications, tests, or procedures; documenting clinical information in the electronic or other health record, independently interpreting results and communicating results to the patient/family/caregiver as well as coordination of care.       All questions were answered. The patient knows to call the clinic with any problems, questions or concerns.  This note was electronically signed.    Loni Muse, MD  09/14/2022 12:53 PM

## 2022-09-10 ENCOUNTER — Other Ambulatory Visit (HOSPITAL_COMMUNITY): Payer: Self-pay

## 2022-09-10 DIAGNOSIS — Z85118 Personal history of other malignant neoplasm of bronchus and lung: Secondary | ICD-10-CM | POA: Diagnosis not present

## 2022-09-10 DIAGNOSIS — N281 Cyst of kidney, acquired: Secondary | ICD-10-CM | POA: Diagnosis not present

## 2022-09-10 DIAGNOSIS — R918 Other nonspecific abnormal finding of lung field: Secondary | ICD-10-CM | POA: Diagnosis not present

## 2022-09-10 DIAGNOSIS — J9811 Atelectasis: Secondary | ICD-10-CM | POA: Diagnosis not present

## 2022-09-10 DIAGNOSIS — Z902 Acquired absence of lung [part of]: Secondary | ICD-10-CM | POA: Diagnosis not present

## 2022-09-10 DIAGNOSIS — C3490 Malignant neoplasm of unspecified part of unspecified bronchus or lung: Secondary | ICD-10-CM | POA: Diagnosis not present

## 2022-09-10 DIAGNOSIS — I700 Atherosclerosis of aorta: Secondary | ICD-10-CM | POA: Diagnosis not present

## 2022-09-10 DIAGNOSIS — I2510 Atherosclerotic heart disease of native coronary artery without angina pectoris: Secondary | ICD-10-CM | POA: Diagnosis not present

## 2022-09-11 ENCOUNTER — Inpatient Hospital Stay (INDEPENDENT_AMBULATORY_CARE_PROVIDER_SITE_OTHER): Payer: Medicare Other | Admitting: Oncology

## 2022-09-11 ENCOUNTER — Other Ambulatory Visit (HOSPITAL_COMMUNITY): Payer: Self-pay

## 2022-09-11 VITALS — BP 139/86 | HR 96 | Temp 97.9°F | Resp 18 | Ht 64.0 in | Wt 187.2 lb

## 2022-09-11 DIAGNOSIS — Z09 Encounter for follow-up examination after completed treatment for conditions other than malignant neoplasm: Secondary | ICD-10-CM

## 2022-09-11 DIAGNOSIS — Z5320 Procedure and treatment not carried out because of patient's decision for unspecified reasons: Secondary | ICD-10-CM

## 2022-09-11 DIAGNOSIS — C3431 Malignant neoplasm of lower lobe, right bronchus or lung: Secondary | ICD-10-CM

## 2022-09-12 ENCOUNTER — Telehealth: Payer: Self-pay | Admitting: Pharmacy Technician

## 2022-09-12 ENCOUNTER — Other Ambulatory Visit (HOSPITAL_COMMUNITY): Payer: Self-pay

## 2022-09-12 NOTE — Telephone Encounter (Signed)
Oral Oncology Patient Advocate Encounter  Prior Authorization for Edgar Frisk has been approved.    PA# [id number] Effective dates: 09/12/22 through 09/12/23  Patients co-pay is $3,244.08.    Jinger Neighbors, CPhT-Adv Oncology Pharmacy Patient Advocate [facility] Direct Number: [phone]  Fax: [phone]

## 2022-09-12 NOTE — Telephone Encounter (Signed)
Oral Oncology Patient Advocate Encounter   Received notification that assistance for Tagrisso has been approved through [employer].  This will allow the patient to receive their medication for $0.  [employer] phone number [phone].  A hardcopy rx will be faxed to the program once signed by the provider.  Jinger Neighbors, CPhT-Adv Oncology Pharmacy Patient Advocate [facility] Direct Number: [phone]  Fax: [phone]

## 2022-09-13 ENCOUNTER — Telehealth: Payer: Self-pay | Admitting: Oncology

## 2022-09-13 NOTE — Telephone Encounter (Signed)
Patient has been scheduled. Aware of appt date and time   follow up Received: 2 days ago Dyane Dustman, RN sent to [employer] Scheduling Schedule one month with Dr. Angelene Giovanni.

## 2022-09-13 NOTE — Telephone Encounter (Signed)
Oral Oncology Pharmacist Encounter  Spoke with MD, patient is not proceeding with Tagrisso at this time and MD will notify if things change.   Oral oncology pharmacy will sign off.   [name], PharmD Hematology/Oncology Clinical Pharmacist [facility] [phone]

## 2022-09-13 NOTE — Telephone Encounter (Signed)
Oral Oncology Patient Advocate Encounter  Therapy on hold at this time. Will reopen later if needed.  Jinger Neighbors, CPhT-Adv Oncology Pharmacy Patient Advocate [facility] Direct Number: [phone]  Fax: [phone]

## 2022-09-14 DIAGNOSIS — Z5320 Procedure and treatment not carried out because of patient's decision for unspecified reasons: Secondary | ICD-10-CM | POA: Insufficient documentation

## 2022-09-18 DIAGNOSIS — I10 Essential (primary) hypertension: Secondary | ICD-10-CM | POA: Diagnosis not present

## 2022-09-18 DIAGNOSIS — Z85118 Personal history of other malignant neoplasm of bronchus and lung: Secondary | ICD-10-CM | POA: Diagnosis not present

## 2022-09-18 DIAGNOSIS — E039 Hypothyroidism, unspecified: Secondary | ICD-10-CM | POA: Diagnosis not present

## 2022-09-18 DIAGNOSIS — E7800 Pure hypercholesterolemia, unspecified: Secondary | ICD-10-CM | POA: Diagnosis not present

## 2022-09-18 DIAGNOSIS — R5383 Other fatigue: Secondary | ICD-10-CM | POA: Diagnosis not present

## 2022-09-26 DIAGNOSIS — K080 Exfoliation of teeth due to systemic causes: Secondary | ICD-10-CM | POA: Diagnosis not present

## 2022-10-11 DIAGNOSIS — M85851 Other specified disorders of bone density and structure, right thigh: Secondary | ICD-10-CM | POA: Diagnosis not present

## 2022-10-11 DIAGNOSIS — E2839 Other primary ovarian failure: Secondary | ICD-10-CM | POA: Diagnosis not present

## 2022-10-16 ENCOUNTER — Inpatient Hospital Stay: Payer: Medicare Other

## 2022-10-16 ENCOUNTER — Ambulatory Visit: Payer: Medicare Other | Admitting: Oncology

## 2022-10-16 ENCOUNTER — Inpatient Hospital Stay: Payer: Medicare Other | Attending: Oncology | Admitting: Oncology

## 2022-10-16 VITALS — BP 134/94 | HR 96 | Temp 97.8°F | Resp 16 | Ht 64.0 in | Wt 187.9 lb

## 2022-10-16 DIAGNOSIS — C3431 Malignant neoplasm of lower lobe, right bronchus or lung: Secondary | ICD-10-CM | POA: Insufficient documentation

## 2022-10-16 DIAGNOSIS — Z09 Encounter for follow-up examination after completed treatment for conditions other than malignant neoplasm: Secondary | ICD-10-CM | POA: Diagnosis not present

## 2022-10-16 DIAGNOSIS — Z5320 Procedure and treatment not carried out because of patient's decision for unspecified reasons: Secondary | ICD-10-CM

## 2022-10-16 LAB — COMPREHENSIVE METABOLIC PANEL
ALT: 14 U/L (ref 0–44)
AST: 18 U/L (ref 15–41)
Albumin: 4.2 g/dL (ref 3.5–5.0)
Alkaline Phosphatase: 99 U/L (ref 38–126)
Anion gap: 9 (ref 5–15)
BUN: 26 mg/dL — ABNORMAL HIGH (ref 8–23)
CO2: 24 mmol/L (ref 22–32)
Calcium: 9.6 mg/dL (ref 8.9–10.3)
Chloride: 106 mmol/L (ref 98–111)
Creatinine, Ser: 0.98 mg/dL (ref 0.44–1.00)
GFR, Estimated: >60 mL/min (ref >60)
Glucose, Bld: 116 mg/dL — ABNORMAL HIGH (ref 70–99)
Potassium: 3.8 mmol/L (ref 3.5–5.1)
Sodium: 139 mmol/L (ref 135–145)
Total Bilirubin: 0.6 mg/dL (ref 0.3–1.2)
Total Protein: 7.2 g/dL (ref 6.5–8.1)

## 2022-10-16 LAB — CBC WITH DIFFERENTIAL/PLATELET
Abs Immature Granulocytes: 0.05 10*3/uL (ref 0.00–0.07)
Basophils Absolute: 0.1 10*3/uL (ref 0.0–0.1)
Basophils Relative: 1 %
Eosinophils Absolute: 0.2 10*3/uL (ref 0.0–0.5)
Eosinophils Relative: 2 %
HCT: 43.2 % (ref 36.0–46.0)
Hemoglobin: 14 g/dL (ref 12.0–15.0)
Immature Granulocytes: 0 %
Lymphocytes Relative: 22 %
Lymphs Abs: 2.5 10*3/uL (ref 0.7–4.0)
MCH: 30.7 pg (ref 26.0–34.0)
MCHC: 32.4 g/dL (ref 30.0–36.0)
MCV: 94.7 fL (ref 80.0–100.0)
Monocytes Absolute: 0.8 10*3/uL (ref 0.1–1.0)
Monocytes Relative: 7 %
Neutro Abs: 7.6 10*3/uL (ref 1.7–7.7)
Neutrophils Relative %: 68 %
Platelets: 355 10*3/uL (ref 150–400)
RBC: 4.56 MIL/uL (ref 3.87–5.11)
RDW: 13.8 % (ref 11.5–15.5)
WBC: 11.2 10*3/uL — ABNORMAL HIGH (ref 4.0–10.5)
nRBC: 0 % (ref 0.0–0.2)

## 2022-10-16 NOTE — Progress Notes (Signed)
[facility] Cancer Initial Visit:  Patient Care Team: Philemon Kingdom, MD as PCP - General (Internal Medicine)  CHIEF COMPLAINTS/PURPOSE OF CONSULTATION:  Oncology History  Lung cancer (HCC)  06/19/2022 Initial Diagnosis   Lung cancer (HCC)   06/19/2022 Cancer Staging   Staging form: Lung, AJCC 8th Edition - Clinical stage from 06/19/2022: Stage IA3 (cT1c, cN0, cM0) - Signed by Loni Muse, MD on 06/19/2022 Histopathologic type: Adenocarcinoma, NOS Stage prefix: Initial diagnosis Histologic grade (G): G1 Histologic grading system: 4 grade system   08/13/2022 Cancer Staging   Staging form: Lung, AJCC 8th Edition - Pathologic stage from 08/13/2022: Stage IB (pT2a, pN0, cM0) - Signed by Loni Muse, MD on 08/13/2022 Histopathologic type: Adenocarcinoma, NOS     HISTORY OF PRESENTING ILLNESS: [name] 77 y.o. female is here because of lung cancer Medical history notable for arthritis, cataracts, chronic back pain, colon polyps, hypertension, nephrolithiasis, neuropathy, shoulder arthroplasty, mild cognitive impairment  March 07, 2022: SOB and with cough.  CT PA.  Underlying mild emphysematous changes. Spiculated appearing 2.9 x 2.7 cm mass in the superior segment of the right  lower lobe is worrisome for neoplasm. No other pulmonary lesions or pulmonary nodules. No pleural effusions.   April 20, 2022: PET/CT.  3 cm right lower lobe pulmonary mass (SUV 7.4).  No hypermetabolic mediastinal or hilar adenopathy.  Subtle FDG uptake in both adrenal glands without underlying nodule or mass on CT imaging as such considered indeterminate.  May 03, 2022: PFTs.  FEV1 158% FVC 150% FEV1/FVC 104%.  DLCO 88.2%  May 09, 2022: Bronchoscopy with lavage, cytology and biopsy nondiagnostic  June 11, 2022: CT-guided biopsy right lower lobe lung mass.  Adenocarcinoma well-differentiated.  IHC demonstrate cytokeratin 7 and TT F1 positivity  June 19 2022:  [facility] Medical Oncology Consult  Patient feels well.  She is able to do pretty much what she wants to do.  Is a bit fatigued.    Social:  Widowed.  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  Lives by self.  No history of tobacco use.  EtOH none.    June 21, 2022: MRI of brain--punctate focus of enhancement within the mid to posterior right frontal lobe white matter.  Chronic small vessel ischemic changes which are minimal  June 28, 2022: Scheduled follow-up for management of lung cancer.  Reviewed results of MRI with patient.    WBC 7.9 hemoglobin 13.4 platelet count 322; 59 segs 29 lymphs 8 monos 3 eos 1 basophil CMP notable for BUN of 28 creatinine 0.94.  July 11, 2022: Thoracic surgery consult  July 26 2022:  Right - ROBOTIC XI THORACOSCOPY, SURGICAL; WITH LOBECTOMY (SINGLE LOBE) BRONCHOSCPY, RIGID OR FLEXIBLE, W/FLUORO; WITH THERAPEUTIC ASPIRATION OF TRACHEOBRONCHIAL TREE, INITIAL Right - ROBOTIC XI THORACOSCOPY, SURGICAL; WITH MEDIASTINAL & REGIONAL LYMPHADENECTOMY   August 06 2022:    Has made a good postop recovery.  Will need final pathology from [facility].  To be seen for surgical follow up in 2 weeks.    Sep 04 2022:  Scheduled follow up regarding lung cancer.  Reviewed results of molecular studies with patient.   Not at 100% but active taking herself and household.   Tempus xT revealed Gene: 3236^EGFR^HGNC  Variant: [id number].  MSI Stable, PDL-1 negative  No mutations in  KRAS, BRAF, ALK, ROS1, RET, MET, ERBB2 (HER2).  Reviewed results of testing with patient.  Discussed use of adjuvant Osimertinib.    Sep 11 2022:  Reviewed recent history and pathology with patient and daughter.  The latter is questioning need for adjuvant therapy since the "surgeon never told her additional therapy was needed".  Daughter is also worried about "putting poison in my mother's body".  October 16 2022:  Scheduled follow up regarding lung cancer.  Feels well.    Active doing heavy  housework such as pressure washing so long as she does it in the morning before it gets too hot.  Will arrange for CT chest prior to return in 2 months.     Review of Systems  Constitutional:  Negative for appetite change, chills, fatigue, fever and unexpected weight change.  HENT:   Negative for lump/mass, mouth sores, nosebleeds, sore throat, tinnitus, trouble swallowing and voice change.   Eyes:  Negative for eye problems and icterus.       Vision changes:  None  Respiratory:  Negative for chest tightness, hemoptysis, shortness of breath and wheezing.        Slight cough  Cardiovascular:  Negative for chest pain, leg swelling and palpitations.       PND:  none Orthopnea:  none  Gastrointestinal:  Negative for abdominal pain, blood in stool, constipation, diarrhea, nausea and vomiting.  Endocrine: Negative for hot flashes.       Cold intolerance:  none Heat intolerance:  none  Genitourinary:  Negative for bladder incontinence, difficulty urinating, dysuria, frequency, hematuria and nocturia.   Musculoskeletal:  Positive for arthralgias and back pain. Negative for gait problem, myalgias, neck pain and neck stiffness.  Skin:  Negative for itching, rash and wound.  Neurological:  Negative for dizziness, extremity weakness, gait problem, headaches, light-headedness, numbness, seizures and speech difficulty.  Hematological:  Negative for adenopathy. Does not bruise/bleed easily.  Psychiatric/Behavioral:  Negative for sleep disturbance and suicidal ideas. The patient is not nervous/anxious.     MEDICAL HISTORY: Past Medical History:  Diagnosis Date   Arthritis    Cataracts, bilateral    immature   Chronic back pain    spondylolisthesis   Complication of anesthesia    unable to get up due dizziness,nausea- stayed overnite   Constipation    takes Colace daily   Depression    but not on any meds   History of colon polyps    benign one time and precancerous another   History of colon  polyps    History of kidney stones 12/2015   Hyperlipidemia    takes Pravastatin daily   Hypertension    takes Losartan daily   Hypothyroidism    takes Synthroid daily   Kidney stones    Numbness    tingling/right lower leg    OA (osteoarthritis)    PONV (postoperative nausea and vomiting)    Shoulder arthritis    Right    SURGICAL HISTORY: Past Surgical History:  Procedure Laterality Date   ABDOMINAL HYSTERECTOMY  2012   bladder tack   BACK SURGERY     BRONCHOSCOPY     COLONOSCOPY  2014   Polyps   LUNG BIOPSY     MAXIMUM ACCESS (MAS)POSTERIOR LUMBAR INTERBODY FUSION (PLIF) 1 LEVEL N/A 07/19/2016   Procedure: LUMBAR FOUR-FIVE MAXIMUM ACCESS (MAS) POSTERIOR LUMBAR INTERBODY FUSION (PLIF), INSTRUMENTED LUMBAR THREE-FIVE;  Surgeon: Tia Alert, MD;  Location: [facility] OR;  Service: Neurosurgery;  Laterality: N/A;   REVERSE SHOULDER ARTHROPLASTY Right 06/13/2017   Procedure: Conversion of right total shoulder arthroplasty to reverse shoulder arthroplasty;  Surgeon: Francena Hanly, MD;  Location: [facility] OR;  Service: Orthopedics;  Laterality: Right;   SHOULDER SURGERY Left 2004   arthroscopy   SHOULDER SURGERY Right 06/13/2017    Conversion of right total shoulder arthroplasty to reverse shoulder arthroplasty (Right Shoulder)   TOTAL SHOULDER ARTHROPLASTY Left 01/19/2016   Procedure: TOTAL SHOULDER ARTHROPLASTY;  Surgeon: Francena Hanly, MD;  Location: [facility] OR;  Service: Orthopedics;  Laterality: Left;   TOTAL SHOULDER ARTHROPLASTY Right 04/18/2017   Procedure: RIGHT TOTAL SHOULDER ARTHROPLASTY;  Surgeon: Francena Hanly, MD;  Location: [facility] OR;  Service: Orthopedics;  Laterality: Right;   TUBAL LIGATION  02/1983    SOCIAL HISTORY: Social History   Socioeconomic History   Marital status: Widowed    Spouse name: Not on file   Number of children: Not on file   Years of education: Not on file   Highest education level: Not on file  Occupational History   Not on file  Tobacco Use    Smoking status: Never    Passive exposure: Past   Smokeless tobacco: Never  Vaping Use   Vaping Use: Never used  Substance and Sexual Activity   Alcohol use: Yes    Alcohol/week: 0.0 standard drinks of alcohol    Comment: rarely   Drug use: No   Sexual activity: Not on file  Other Topics Concern   Not on file  Social History Narrative   Not on file   Social Determinants of Health   Financial Resource Strain: Not on file  Food Insecurity: No Food Insecurity (06/19/2022)   Hunger Vital Sign    Worried About Running Out of Food in the Last Year: Never true    Ran Out of Food in the Last Year: Never true  Transportation Needs: No Transportation Needs (06/19/2022)   PRAPARE - Administrator, Civil Service (Medical): No    Lack of Transportation (Non-Medical): No  Physical Activity: Not on file  Stress: Not on file  Social Connections: Not on file  Intimate Partner Violence: Not At Risk (06/19/2022)   Humiliation, Afraid, Rape, and Kick questionnaire    Fear of Current or Ex-Partner: No    Emotionally Abused: No    Physically Abused: No    Sexually Abused: No    FAMILY HISTORY Family History  Problem Relation Age of Onset   Lung cancer Mother    Lung cancer Father    Alcoholism Father    Alcoholism Brother    Skin cancer Daughter    Memory loss Half-Brother    Dementia Neg Hx     ALLERGIES:  has No Known Allergies.  MEDICATIONS:  Current Outpatient Medications  Medication Sig Dispense Refill   acetaminophen (TYLENOL) 650 MG CR tablet Take 650-1,300 mg by mouth 3 (three) times daily as needed for pain.      Calcium-Vitamin D-Vitamin K 650-12.5-40 MG-MCG-MCG CHEW Chew by mouth.     cholecalciferol (VITAMIN D3) 25 MCG (1000 UNIT) tablet Take 1,000 Units by mouth daily.     COLLAGEN PO Take by mouth. Takes daily     Cyanocobalamin (B-12 PO) Take by mouth.     diclofenac sodium (VOLTAREN) 1 % GEL Apply 1 application topically 3 (three) times daily as needed  (joint pain).     donepezil (ARICEPT) 5 MG tablet Take 1 tablet (5 mg total) by mouth in the morning. 90 tablet 3   L-LYSINE PO Take 0.5 tablets by mouth daily.      levothyroxine (SYNTHROID, LEVOTHROID) 50 MCG tablet Take 50 mcg by mouth daily before  breakfast.      losartan (COZAAR) 25 MG tablet Take 25 mg by mouth once daily  0   magnesium 30 MG tablet Take 30 mg by mouth as needed.     Melatonin 10 MG TABS Take by mouth.     meloxicam (MOBIC) 15 MG tablet Take 1 tablet by mouth daily.     Omega-3 Fatty Acids (FISH OIL) 1200 MG CAPS Take 1,200 mg by mouth every evening.     osimertinib mesylate (TAGRISSO) 80 MG tablet Take 1 tablet (80 mg total) by mouth daily. 30 tablet 5   oxyCODONE (OXY IR/ROXICODONE) 5 MG immediate release tablet Take by mouth.     pravastatin (PRAVACHOL) 40 MG tablet Take 40 mg by mouth at bedtime.      QC STOOL SOFTENER 100 MG capsule Take 100 mg by mouth 2 (two) times daily as needed.     Current Facility-Administered Medications  Medication Dose Route Frequency Provider Last Rate Last Admin   0.9 %  sodium chloride infusion  500 mL Intravenous Once Lynann Bologna, MD        PHYSICAL EXAMINATION:  ECOG PERFORMANCE STATUS: 0 - Asymptomatic   There were no vitals filed for this visit.   There were no vitals filed for this visit.     Physical Exam Vitals and nursing note reviewed.  Constitutional:      General: She is not in acute distress.    Appearance: Normal appearance. She is normal weight. She is not ill-appearing, toxic-appearing or diaphoretic.     Comments: Here alone.  Comfortable  HENT:     Head: Normocephalic and atraumatic.     Right Ear: External ear normal.     Left Ear: External ear normal.     Nose: Nose normal. No congestion or rhinorrhea.  Eyes:     General: No scleral icterus.    Extraocular Movements: Extraocular movements intact.     Conjunctiva/sclera: Conjunctivae normal.     Pupils: Pupils are equal, round, and reactive to  light.  Cardiovascular:     Rate and Rhythm: Normal rate and regular rhythm.     Heart sounds: No murmur heard.    No friction rub. No gallop.  Pulmonary:     Effort: Pulmonary effort is normal. No respiratory distress.     Breath sounds: No stridor. No wheezing or rhonchi.  Abdominal:     General: Bowel sounds are normal.     Palpations: Abdomen is soft.     Tenderness: There is no abdominal tenderness. There is no guarding or rebound.  Musculoskeletal:        General: No swelling, tenderness or deformity.     Cervical back: Normal range of motion and neck supple. No rigidity or tenderness.     Right lower leg: No edema.     Left lower leg: No edema.  Lymphadenopathy:     Head:     Right side of head: No submental, submandibular, tonsillar, preauricular, posterior auricular or occipital adenopathy.     Left side of head: No submental, submandibular, tonsillar, preauricular, posterior auricular or occipital adenopathy.     Cervical: No cervical adenopathy.     Right cervical: No superficial, deep or posterior cervical adenopathy.    Left cervical: No superficial, deep or posterior cervical adenopathy.     Upper Body:     Right upper body: No supraclavicular, axillary, pectoral or epitrochlear adenopathy.     Left upper body: No supraclavicular, axillary, pectoral or epitrochlear  adenopathy.  Skin:    General: Skin is warm.     Coloration: Skin is not jaundiced or pale.     Findings: No bruising or erythema.  Neurological:     General: No focal deficit present.     Mental Status: She is alert and oriented to person, place, and time. Mental status is at baseline.     Cranial Nerves: No cranial nerve deficit.     Motor: No weakness.     Gait: Gait normal.  Psychiatric:        Mood and Affect: Mood normal.        Behavior: Behavior normal.        Thought Content: Thought content normal.        Judgment: Judgment normal.     LABORATORY DATA: I have personally reviewed the  data as listed:  No visits with results within 1 Month(s) from this visit.  Latest known visit with results is:  Office Visit on 09/04/2022  Component Date Value Ref Range Status   WBC 09/04/2022 8.0  4.0 - 10.5 K/uL Final   RBC 09/04/2022 4.57  3.87 - 5.11 MIL/uL Final   Hemoglobin 09/04/2022 14.6  12.0 - 15.0 g/dL Final   HCT 29/52/8413 43.1  36.0 - 46.0 % Final   MCV 09/04/2022 94.3  80.0 - 100.0 fL Final   MCH 09/04/2022 31.9  26.0 - 34.0 pg Final   MCHC 09/04/2022 33.9  30.0 - 36.0 g/dL Final   RDW 24/40/1027 13.4  11.5 - 15.5 % Final   Platelets 09/04/2022 297  150 - 400 K/uL Final   nRBC 09/04/2022 0.0  0.0 - 0.2 % Final   Neutrophils Relative % 09/04/2022 59  % Final   Neutro Abs 09/04/2022 4.7  1.7 - 7.7 K/uL Final   Lymphocytes Relative 09/04/2022 28  % Final   Lymphs Abs 09/04/2022 2.2  0.7 - 4.0 K/uL Final   Monocytes Relative 09/04/2022 9  % Final   Monocytes Absolute 09/04/2022 0.8  0.1 - 1.0 K/uL Final   Eosinophils Relative 09/04/2022 3  % Final   Eosinophils Absolute 09/04/2022 0.2  0.0 - 0.5 K/uL Final   Basophils Relative 09/04/2022 1  % Final   Basophils Absolute 09/04/2022 0.1  0.0 - 0.1 K/uL Final   Immature Granulocytes 09/04/2022 0  % Final   Abs Immature Granulocytes 09/04/2022 0.03  0.00 - 0.07 K/uL Final   Performed at [facility], [address] [place], [place] [address]   Sodium 09/04/2022 139  135 - 145 mmol/L Final   Potassium 09/04/2022 4.0  3.5 - 5.1 mmol/L Final   Chloride 09/04/2022 106  98 - 111 mmol/L Final   CO2 09/04/2022 25  22 - 32 mmol/L Final   Glucose, Bld 09/04/2022 100 (H)  70 - 99 mg/dL Final   Glucose reference range applies only to samples taken after fasting for at least 8 hours.   BUN 09/04/2022 29 (H)  8 - 23 mg/dL Final   Creatinine, Ser 09/04/2022 0.75  0.44 - 1.00 mg/dL Final   Calcium 44/06/4740 9.3  8.9 - 10.3 mg/dL Final   Total Protein 59/56/3875 7.5  6.5 - 8.1 g/dL Final   Albumin 64/33/2951 4.3   3.5 - 5.0 g/dL Final   AST 88/41/6606 14 (L)  15 - 41 U/L Final   ALT 09/04/2022 15  0 - 44 U/L Final   Alkaline Phosphatase 09/04/2022 95  38 - 126 U/L Final  Total Bilirubin 09/04/2022 0.6  0.3 - 1.2 mg/dL Final   GFR, Estimated 09/04/2022 >60  >60 mL/min Final   Comment: (NOTE) Calculated using the CKD-EPI Creatinine Equation (2021)    Anion gap 09/04/2022 8  5 - 15 Final   Performed at [facility], [address] [place], [place] [address]    RADIOGRAPHIC STUDIES: I have personally reviewed the radiological images as listed and agree with the findings in the report  No results found.  ASSESSMENT/PLAN 77 y.o. nonsmoking female with newly diagnosed lung cancer.  Medical history notable for arthritis, cataracts, chronic back pain, colon polyps, hypertension, nephrolithiasis, neuropathy, shoulder arthroplasty, mild cognitive impairment  Adenocarcinoma, Right lung, lower lobe, Stage IB (T2 N0 M0) EGFR  [id number].  MSI Stable, PDL-1 negative  No mutations in  KRAS, BRAF, ALK, ROS1, RET, MET, ERBB2 (HER2).   March 07 2022:  CT PA demonstrated 2.9 cm spiculated mass superior segment RLL  April 20 2022:  PET CT showed 3 cm RLL mass (SUV 7.4).  No hypermetabolic mediastinal or hilar adenopathy. Subtle FDG uptake in both adrenal glands without underlying nodule or mass on CT imaging as such considered indeterminate.  May 03 2022:  PFTs. FEV1 158% FVC 150% FEV1/FVC 104%. DLCO 88.2%   June 11, 2022: CT-guided biopsy right lower lobe lung mass. Adenocarcinoma well-differentiated. IHC demonstrate cytokeratin 7 and TT F1 positivity   June 21 2022:  MRI brain negative for metastasis   Therapeutics:  Since patient has excellent performance status, good PFT's, disease which appears to be amenable to resection and good family support, recommended consultation with Thoracic surgery for consideration of resection.   July 26 2022- Right robotic thoracoscopy  with mediastinal and regional lymphadenectomy  August 06, 2022: Recovering well from surgery.    Sep 04 2022- Given stage, and results of molecular studies recommended use of adjuvant Osimertinib Sep 10 2021- Reviewed literature results describing risks and benefits of Osimertinib.  Explained that there is not only a statistical benefit, but also a clinical one for administration of adjuvant TKI therapy.  Patient and daughter declined consideration of adjuvant treatment at this time but have agreed to long term follow up  October 16 2022- Fatigue gradually improving.  Continues to decline adjuvant Osmiertinib in favor of surveillance.  Arranging for follow up CT chest   Chemotherapy Risks:  Discussed the potential side effects of chemotherapy with the patient.  These include but are not limited to:  Fatigue, Hair loss, low blood counts (anemia, thrombocytopenia) that may necessitate transfusion, bleeding, infection, nausea/ vomiting/Appetite changes/ Constipation/ Diarrhea, mucositis, Neuropathy/ neurologic problems, skin and nail changes such as dry skin and color change, Urine and bladder changes and kidney problems, weight changes, mood changes, decreased libido/fertility problems, damage to heart and lungs.  Some of these side effects can be life threatening, may be permanent and can result in hospitalization and/or death.  In shared decision making patient has agreed to proceed with chemotherapy.  Will refer patient for formal chemotherapy teaching.    Mild cognitive impairment:  This may predispose her to development of postoperative delirium  June 21 2022:  MRI brain --punctate focus of enhancement within the mid to posterior right frontal lobe white matter.  Chronic small vessel ischemic changes which are minimal   June 28 2022:  MRI findings suggest that patient may have had a vascular event that caused the memory impairment rather than a progressive disorder     Cancer Staging  Lung cancer  (  HCC) Staging form: Lung, AJCC 8th Edition - Clinical stage from 06/19/2022: Stage IA3 (cT1c, cN0, cM0) - Signed by Loni Muse, MD on 06/19/2022 Histopathologic type: Adenocarcinoma, NOS Stage prefix: Initial diagnosis Histologic grade (G): G1 Histologic grading system: 4 grade system - Pathologic stage from 08/13/2022: Stage IB (pT2a, pN0, cM0) - Signed by Loni Muse, MD on 08/13/2022 Histopathologic type: Adenocarcinoma, NOS    No problem-specific Assessment & Plan notes found for this encounter.    No orders of the defined types were placed in this encounter.   30  minutes was spent in patient care.  This included time spent preparing to see the patient (e.g., review of tests), obtaining and/or reviewing separately obtained history, counseling and educating the patient/family/caregiver, ordering medications, tests, or procedures; documenting clinical information in the electronic or other health record, independently interpreting results and communicating results to the patient/family/caregiver as well as coordination of care.       All questions were answered. The patient knows to call the clinic with any problems, questions or concerns.  This note was electronically signed.    Loni Muse, MD  10/16/2022 8:35 AM

## 2022-10-17 LAB — CEA: CEA: 3.2 ng/mL (ref 0.0–4.7)

## 2022-10-31 DIAGNOSIS — I10 Essential (primary) hypertension: Secondary | ICD-10-CM | POA: Diagnosis not present

## 2022-11-04 DIAGNOSIS — S0500XA Injury of conjunctiva and corneal abrasion without foreign body, unspecified eye, initial encounter: Secondary | ICD-10-CM | POA: Diagnosis not present

## 2022-11-04 DIAGNOSIS — H5713 Ocular pain, bilateral: Secondary | ICD-10-CM | POA: Diagnosis not present

## 2022-11-05 ENCOUNTER — Encounter: Payer: Self-pay | Admitting: Adult Health

## 2022-11-05 ENCOUNTER — Ambulatory Visit: Payer: Medicare Other | Admitting: Adult Health

## 2022-11-05 VITALS — BP 144/84 | HR 81 | Ht 65.0 in | Wt 189.0 lb

## 2022-11-05 DIAGNOSIS — G3184 Mild cognitive impairment, so stated: Secondary | ICD-10-CM

## 2022-11-05 NOTE — Patient Instructions (Signed)
Your Plan:  Continue to monitor memory Score is stable If your symptoms worsen or you develop new symptoms please let us know.    Thank you for coming to see [place] at [facility]. I hope we have been able to provide you high quality care today.  You may receive a patient satisfaction survey over the next few weeks. We would appreciate your feedback and comments so that we may continue to improve ourselves and the health of our patients.

## 2022-11-05 NOTE — Progress Notes (Signed)
PATIENT: [name] DOB: 1945/10/03  REASON FOR VISIT: follow up HISTORY FROM: patient PRIMARY NEUROLOGIST: Dr. Lucia Gaskins  Chief Complaint  Patient presents with   Follow-up    Pt in 11/14/22 with daughter Daughter states short term memory is a little worse Pt had surgery April  2024 to remove tumor (cancerous) in right lower lobe Pt had CT scan in March 2024 discovered stroke      HISTORY OF PRESENT ILLNESS: Today 11/05/22:  [name] is a 77 y.o. female with a history of Mild cognitive impairment. Returns today for follow-up.  She continues to live at home alone.  Able to complete all ADLs independently.  Continues to manage her own medications and appointments.  She is still very active in several social groups.  This past year she did have a cancerous tumor removed from the right lower lobe.  No chemo or radiation at this time.  Her daughter reports that they were told on imaging she had had a small stroke at some point.  I reviewed the scanned MRI report but I do not see mentioned that their was a possible infarct.   11/02/21: [name] is a 77 year old female with a history of Mild Cognitive Impairment. She Returns today for follow-up. She reports that memory is stable. Lives at home alone. Husband passed away last year. Still grieving the loss. Reports that she has a hard time talking about it still. Able to complete all ADLs independently. Manages own mediations and appointments.  Tries to stay involved in activities.  Considering getting counseling due to her grief.  Remains on Aricept 5 mg in the morning.  She feels that she is tolerating this well.  She does feel that it may have interfered with her sleep initially.    Has DDD has to take tylenol or aleve for discomfort. Mainly at night.   05/04/21: [name] is a 77 year old female with a history of mild cognitive impairment.  She returns today for follow-up.  She did have neuropsychological evaluation with Dr. Roseanne Reno.  I have  evaluated his notes.  The patient currently lives at home alone.  Her husband passed away in 11/14/2022 and she is going through the grieving process.  She is able to complete all ADLs independently.  Reports good appetite.  She still prepares meals.  She manages her own medications.  She is currently not on any memory medication.  She returns today for an evaluation.  HISTORY [name] is a 77 y.o. female here as requested by Philemon Kingdom, MD for memory loss.  Past medical history thyroid disease, high blood pressure, degenerative disc disease, subjective memory loss, osteoarthritis, elevated cholesterol, depression, kidney stones.  I reviewed Dr. Geoffery Spruce notes, patient complaining of memory issues, memory issues have not resolved, patient states that she is having to use a GPS to get places now, she is lived in [place] for 38 years and now having to use a GPS to find her way, her children have noticed that she is more forgetful, today she had to think about which drawer in her kitchen she needed to go to get something out, worsening for the last 2 to 3 months, before that she was having issues with recalling a person's name if she saw them out of context.     The last year or two she started noticing some memory changes, she is here with her daughter who also provides information. Around Christmas she developed nerve pain  and she was taken to urgent care and she had an mri of her back and she had ruptured a disk, She was given Lyrica and after that she had sudden onset of increased memory problems, dramatic to her, she would walk into her kitchen and she wouldn't know where her silverware was, she got lost going into town, she was using GPS to find places she was going to for years, she may have better days then others. She was vacicnated in 2/21 and last booster of covid 10/21. She is a sensitive person, she can be talking about something and tear up, she cries at commercials, she is very emotional.  She has had a hx of depression and her christian faith has helped overcome that. Also balance issues. She lives with her husband, her husband is sick has cancer and there is stress and has been going on for 2 years. Her half brother had some sort of mental problem. Mother and father without dementia. Mom lived to 94s and father to 53s. She is not aware of any dementia.    Reviewed notes, labs and imaging from outside physicians, which showed:   I reviewed MRI of the brain report from [facility] radiology completed at [facility] June 21, 2020: Cerebral volume is normal for age, similar to prior MRI of 02/17/2019, there is minimal multifocal T2 flair hyperintensity within the cerebral white matter which is nonspecific but compatible with chronic small vessel ischemic disease, prominent perivascular space within the left midbrain, no cortical encephalomalacia identified, no acute infarct, no evidence of intracranial mass, no chronic intracranial blood products, no extra-axial fluid collection, no midline shift, partially empty sella Turcica, expected proximal artery flow voids.  Impression: No evidence of acute intracranial abnormality, no evidence of acute or recent subacute infarction, stable noncontrast MRI appearance of the brain is compared to February 17, 2019, minimum chronic small vessel ischemic changes within the cerebral white matter.   Blood work collected June 15, 2020: CMP normal with BUN 11 and creatinine 0.79, CBC normal, B12 599.  Hemoglobin A1c 5.3.  TSH normal 3.68.  RPR nonreactive.    REVIEW OF SYSTEMS: Out of a complete 14 system review of symptoms, the patient complains only of the following symptoms, and all other reviewed systems are negative.  ALLERGIES: No Known Allergies  HOME MEDICATIONS: Outpatient Medications Prior to Visit  Medication Sig Dispense Refill   acetaminophen (TYLENOL) 650 MG CR tablet Take 650-1,300 mg by mouth as needed for pain.     Calcium-Vitamin  D-Vitamin K 650-12.5-40 MG-MCG-MCG CHEW Chew by mouth.     cholecalciferol (VITAMIN D3) 25 MCG (1000 UNIT) tablet Take 1,000 Units by mouth daily.     COLLAGEN PO Take by mouth. Takes daily     Cyanocobalamin (B-12 PO) Take by mouth.     diclofenac sodium (VOLTAREN) 1 % GEL Apply 1 application  topically as needed (joint pain).     L-LYSINE PO Take 0.5 tablets by mouth daily.      levothyroxine (SYNTHROID, LEVOTHROID) 50 MCG tablet Take 50 mcg by mouth daily before breakfast.      losartan (COZAAR) 25 MG tablet Take 25 mg by mouth once daily  0   magnesium 30 MG tablet Take 30 mg by mouth as needed.     Melatonin 10 MG TABS Take by mouth as needed.     meloxicam (MOBIC) 15 MG tablet Take 1 tablet by mouth daily.     Omega-3 Fatty Acids (FISH OIL) 1200 MG  CAPS Take 1,200 mg by mouth every evening.     osimertinib mesylate (TAGRISSO) 80 MG tablet Take 1 tablet (80 mg total) by mouth daily. 30 tablet 5   oxyCODONE (OXY IR/ROXICODONE) 5 MG immediate release tablet Take by mouth.     pravastatin (PRAVACHOL) 40 MG tablet Take 40 mg by mouth at bedtime.      QC STOOL SOFTENER 100 MG capsule Take 100 mg by mouth 2 (two) times daily as needed.     donepezil (ARICEPT) 5 MG tablet Take 1 tablet (5 mg total) by mouth in the morning. (Patient not taking: Reported on 11/05/2022) 90 tablet 3   Facility-Administered Medications Prior to Visit  Medication Dose Route Frequency Provider Last Rate Last Admin   0.9 %  sodium chloride infusion  500 mL Intravenous Once Lynann Bologna, MD        PAST MEDICAL HISTORY: Past Medical History:  Diagnosis Date   Arthritis    Cataracts, bilateral    immature   Chronic back pain    spondylolisthesis   Complication of anesthesia    unable to get up due dizziness,nausea- stayed overnite   Constipation    takes Colace daily   Depression    but not on any meds   History of colon polyps    benign one time and precancerous another   History of colon polyps     History of kidney stones 12/2015   Hyperlipidemia    takes Pravastatin daily   Hypertension    takes Losartan daily   Hypothyroidism    takes Synthroid daily   Kidney stones    Numbness    tingling/right lower leg    OA (osteoarthritis)    PONV (postoperative nausea and vomiting)    Shoulder arthritis    Right    PAST SURGICAL HISTORY: Past Surgical History:  Procedure Laterality Date   ABDOMINAL HYSTERECTOMY  2012   bladder tack   BACK SURGERY     BRONCHOSCOPY     COLONOSCOPY  2014   Polyps   LUNG BIOPSY     MAXIMUM ACCESS (MAS)POSTERIOR LUMBAR INTERBODY FUSION (PLIF) 1 LEVEL N/A 07/19/2016   Procedure: LUMBAR FOUR-FIVE MAXIMUM ACCESS (MAS) POSTERIOR LUMBAR INTERBODY FUSION (PLIF), INSTRUMENTED LUMBAR THREE-FIVE;  Surgeon: Tia Alert, MD;  Location: [facility] OR;  Service: Neurosurgery;  Laterality: N/A;   REVERSE SHOULDER ARTHROPLASTY Right 06/13/2017   Procedure: Conversion of right total shoulder arthroplasty to reverse shoulder arthroplasty;  Surgeon: Francena Hanly, MD;  Location: [facility] OR;  Service: Orthopedics;  Laterality: Right;   SHOULDER SURGERY Left 2004   arthroscopy   SHOULDER SURGERY Right 06/13/2017    Conversion of right total shoulder arthroplasty to reverse shoulder arthroplasty (Right Shoulder)   TOTAL SHOULDER ARTHROPLASTY Left 01/19/2016   Procedure: TOTAL SHOULDER ARTHROPLASTY;  Surgeon: Francena Hanly, MD;  Location: [facility] OR;  Service: Orthopedics;  Laterality: Left;   TOTAL SHOULDER ARTHROPLASTY Right 04/18/2017   Procedure: RIGHT TOTAL SHOULDER ARTHROPLASTY;  Surgeon: Francena Hanly, MD;  Location: [facility] OR;  Service: Orthopedics;  Laterality: Right;   TUBAL LIGATION  02/1983    FAMILY HISTORY: Family History  Problem Relation Age of Onset   Lung cancer Mother    Lung cancer Father    Alcoholism Father    Alcoholism Brother    Skin cancer Daughter    Memory loss Half-Brother    Dementia Neg Hx     SOCIAL HISTORY: Social History   Socioeconomic  History   Marital status: Widowed  Spouse name: Not on file   Number of children: Not on file   Years of education: Not on file   Highest education level: Not on file  Occupational History   Not on file  Tobacco Use   Smoking status: Never    Passive exposure: Past   Smokeless tobacco: Never  Vaping Use   Vaping status: Never Used  Substance and Sexual Activity   Alcohol use: Yes    Alcohol/week: 0.0 standard drinks of alcohol    Comment: rarely   Drug use: No   Sexual activity: Not on file  Other Topics Concern   Not on file  Social History Narrative   Not on file   Social Determinants of Health   Financial Resource Strain: Not on file  Food Insecurity: No Food Insecurity (06/19/2022)   Hunger Vital Sign    Worried About Running Out of Food in the Last Year: Never true    Ran Out of Food in the Last Year: Never true  Transportation Needs: No Transportation Needs (06/19/2022)   PRAPARE - Administrator, Civil Service (Medical): No    Lack of Transportation (Non-Medical): No  Physical Activity: Not on file  Stress: No Stress Concern Present (05/09/2022)   Received from [facility] - Occupational Stress Questionnaire    Feeling of Stress : Only a little  Social Connections: Unknown (09/05/2021)   Received from [facility]   Social Network    Social Network: Not on file  Intimate Partner Violence: Not At Risk (06/19/2022)   Humiliation, Afraid, Rape, and Kick questionnaire    Fear of Current or Ex-Partner: No    Emotionally Abused: No    Physically Abused: No    Sexually Abused: No      PHYSICAL EXAM  Vitals:   11/05/22 1104  BP: (!) 144/84  Pulse: 81  Weight: 189 lb (85.7 kg)  Height: 5\' 5"  (1.651 m)    Body mass index is 31.45 kg/m.     11/05/2022   11:06 AM 11/02/2021   10:32 AM 05/04/2021   11:06 AM  MMSE - Mini Mental State Exam  Orientation to time 4 5 4   Orientation to Place 5 5 5    Registration 3 3 3   Attention/ Calculation 5 5 5   Recall 3 3 3   Language- name 2 objects 2 2 2   Language- repeat 1 1 0  Language- follow 3 step command 3 3 3   Language- read & follow direction 1 1 1   Write a sentence 1 1 1   Copy design 0 1 1  Total score 28 30 28      Generalized: Tearful when she speaks about her husband  Neurological examination  Mentation: Alert oriented to time, place, history taking. Follows all commands speech and language fluent Cranial nerve II-XII: Pupils were equal round reactive to light. Extraocular movements were full, visual field were full on confrontational test. Facial sensation and strength were normal.  Head turning and shoulder shrug  were normal and symmetric. Motor: The motor testing reveals 5 over 5 strength of all 4 extremities. Good symmetric motor tone is noted throughout.  Sensory: Sensory testing is intact to soft touch on all 4 extremities. No evidence of extinction is noted.  Coordination: Cerebellar testing reveals good finger-nose-finger and heel-to-shin bilaterally.  Gait and station: Gait is normal.    DIAGNOSTIC DATA (LABS, IMAGING, TESTING) - I reviewed patient records, labs, notes, testing and imaging myself  where available.  Lab Results  Component Value Date   WBC 11.2 (H) 10/16/2022   HGB 14.0 10/16/2022   HCT 43.2 10/16/2022   MCV 94.7 10/16/2022   PLT 355 10/16/2022      Component Value Date/Time   NA 139 10/16/2022 1629   K 3.8 10/16/2022 1629   CL 106 10/16/2022 1629   CO2 24 10/16/2022 1629   GLUCOSE 116 (H) 10/16/2022 1629   BUN 26 (H) 10/16/2022 1629   CREATININE 0.98 10/16/2022 1629   CALCIUM 9.6 10/16/2022 1629   PROT 7.2 10/16/2022 1629   ALBUMIN 4.2 10/16/2022 1629   AST 18 10/16/2022 1629   ALT 14 10/16/2022 1629   ALKPHOS 99 10/16/2022 1629   BILITOT 0.6 10/16/2022 1629   GFRNONAA >60 10/16/2022 1629   GFRAA >60 06/13/2017 0630      ASSESSMENT AND PLAN 77 y.o. year old female  has a past  medical history of Arthritis, Cataracts, bilateral, Chronic back pain, Complication of anesthesia, Constipation, Depression, History of colon polyps, History of colon polyps, History of kidney stones (12/2015), Hyperlipidemia, Hypertension, Hypothyroidism, Kidney stones, Numbness, OA (osteoarthritis), PONV (postoperative nausea and vomiting), and Shoulder arthritis. here with :  1.  Mild cognitive impairment  Memory score is stable MMSE 28/30 Advised that if she brings the images of her most recent MRI or CT scan I can have our neuroradiologist review it. Follow-up 1 year or sooner if needed.    Butch Penny, MSN, NP-C 11/05/2022, 11:04 AM [facility] [address], [address] [place], [place] [address] [phone]

## 2022-11-08 ENCOUNTER — Telehealth: Payer: Self-pay | Admitting: Oncology

## 2022-11-08 NOTE — Telephone Encounter (Signed)
CT Chest has been scheduled for 11/26/22 @ 1:30; Check in @ 12:30   Notified pt of date,time and instructions.

## 2022-11-15 DIAGNOSIS — K449 Diaphragmatic hernia without obstruction or gangrene: Secondary | ICD-10-CM | POA: Diagnosis not present

## 2022-11-15 DIAGNOSIS — R112 Nausea with vomiting, unspecified: Secondary | ICD-10-CM | POA: Diagnosis not present

## 2022-11-15 DIAGNOSIS — N200 Calculus of kidney: Secondary | ICD-10-CM | POA: Diagnosis not present

## 2022-11-15 DIAGNOSIS — R1011 Right upper quadrant pain: Secondary | ICD-10-CM | POA: Diagnosis not present

## 2022-11-15 DIAGNOSIS — R1031 Right lower quadrant pain: Secondary | ICD-10-CM | POA: Diagnosis not present

## 2022-11-15 DIAGNOSIS — R109 Unspecified abdominal pain: Secondary | ICD-10-CM | POA: Diagnosis not present

## 2022-11-15 DIAGNOSIS — N131 Hydronephrosis with ureteral stricture, not elsewhere classified: Secondary | ICD-10-CM | POA: Diagnosis not present

## 2022-11-15 DIAGNOSIS — N1330 Unspecified hydronephrosis: Secondary | ICD-10-CM | POA: Diagnosis not present

## 2022-11-26 DIAGNOSIS — C3431 Malignant neoplasm of lower lobe, right bronchus or lung: Secondary | ICD-10-CM | POA: Diagnosis not present

## 2022-11-26 DIAGNOSIS — N132 Hydronephrosis with renal and ureteral calculous obstruction: Secondary | ICD-10-CM | POA: Diagnosis not present

## 2022-11-26 DIAGNOSIS — C3490 Malignant neoplasm of unspecified part of unspecified bronchus or lung: Secondary | ICD-10-CM | POA: Diagnosis not present

## 2022-11-26 LAB — BASIC METABOLIC PANEL
BUN: 23 — AB (ref 4–21)
CO2: 19 (ref 13–22)
Chloride: 106 (ref 99–108)
Creatinine: 1 (ref 0.5–1.1)
Glucose: 106
Potassium: 4.3 mEq/L (ref 3.5–5.1)
Sodium: 140 (ref 137–147)

## 2022-11-26 LAB — CBC AND DIFFERENTIAL
HCT: 42 (ref 36–46)
Hemoglobin: 14.1 (ref 12.0–16.0)
Neutrophils Absolute: 6.67
Platelets: 386 10*3/uL (ref 150–400)
WBC: 10.1

## 2022-11-26 LAB — HEPATIC FUNCTION PANEL
ALT: 14 U/L (ref 7–35)
AST: 31 (ref 13–35)
Alkaline Phosphatase: 87 (ref 25–125)
Bilirubin, Total: 0.9

## 2022-11-26 LAB — COMPREHENSIVE METABOLIC PANEL
Albumin: 4.6 (ref 3.5–5.0)
Calcium: 9.5 (ref 8.7–10.7)

## 2022-11-26 LAB — CBC: RBC: 4.5 (ref 3.87–5.11)

## 2022-11-27 DIAGNOSIS — N1330 Unspecified hydronephrosis: Secondary | ICD-10-CM | POA: Diagnosis not present

## 2022-11-27 DIAGNOSIS — R3129 Other microscopic hematuria: Secondary | ICD-10-CM | POA: Diagnosis not present

## 2022-11-27 DIAGNOSIS — Z79899 Other long term (current) drug therapy: Secondary | ICD-10-CM | POA: Diagnosis not present

## 2022-11-27 DIAGNOSIS — N201 Calculus of ureter: Secondary | ICD-10-CM | POA: Diagnosis not present

## 2022-11-28 ENCOUNTER — Inpatient Hospital Stay: Payer: Medicare Other

## 2022-11-28 ENCOUNTER — Inpatient Hospital Stay: Payer: Medicare Other | Attending: Oncology | Admitting: Oncology

## 2022-11-28 DIAGNOSIS — Z09 Encounter for follow-up examination after completed treatment for conditions other than malignant neoplasm: Secondary | ICD-10-CM | POA: Diagnosis not present

## 2022-11-28 DIAGNOSIS — C3431 Malignant neoplasm of lower lobe, right bronchus or lung: Secondary | ICD-10-CM | POA: Diagnosis not present

## 2022-11-28 DIAGNOSIS — Z5320 Procedure and treatment not carried out because of patient's decision for unspecified reasons: Secondary | ICD-10-CM | POA: Diagnosis not present

## 2022-11-28 NOTE — Progress Notes (Signed)
[facility] Cancer Initial Visit:  Patient Care Team: Philemon Kingdom, MD as PCP - General (Internal Medicine)  CHIEF COMPLAINTS/PURPOSE OF CONSULTATION:  Oncology History  Lung cancer (HCC)  06/19/2022 Initial Diagnosis   Lung cancer (HCC)   06/19/2022 Cancer Staging   Staging form: Lung, AJCC 8th Edition - Clinical stage from 06/19/2022: Stage IA3 (cT1c, cN0, cM0) - Signed by Loni Muse, MD on 06/19/2022 Histopathologic type: Adenocarcinoma, NOS Stage prefix: Initial diagnosis Histologic grade (G): G1 Histologic grading system: 4 grade system   08/13/2022 Cancer Staging   Staging form: Lung, AJCC 8th Edition - Pathologic stage from 08/13/2022: Stage IB (pT2a, pN0, cM0) - Signed by Loni Muse, MD on 08/13/2022 Histopathologic type: Adenocarcinoma, NOS     HISTORY OF PRESENTING ILLNESS: [name] 77 y.o. female is here because of lung cancer Medical history notable for arthritis, cataracts, chronic back pain, colon polyps, hypertension, nephrolithiasis, neuropathy, shoulder arthroplasty, mild cognitive impairment  March 07, 2022: SOB and with cough.  CT PA.  Underlying mild emphysematous changes. Spiculated appearing 2.9 x 2.7 cm mass in the superior segment of the right  lower lobe is worrisome for neoplasm. No other pulmonary lesions or pulmonary nodules. No pleural effusions.   April 20, 2022: PET/CT.  3 cm right lower lobe pulmonary mass (SUV 7.4).  No hypermetabolic mediastinal or hilar adenopathy.  Subtle FDG uptake in both adrenal glands without underlying nodule or mass on CT imaging as such considered indeterminate.  May 03, 2022: PFTs.  FEV1 158% FVC 150% FEV1/FVC 104%.  DLCO 88.2%  May 09, 2022: Bronchoscopy with lavage, cytology and biopsy nondiagnostic  June 11, 2022: CT-guided biopsy right lower lobe lung mass.  Adenocarcinoma well-differentiated.  IHC demonstrate cytokeratin 7 and TT F1 positivity  June 19 2022:  [facility] On[facility] Consult  Patient feels well.  She is able to do pretty much what she wants to do.  Is a bit fatigued.    Social:  Widowed.  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  Lives by self.  No history of tobacco use.  EtOH none.    June 21, 2022: MRI of brain--punctate focus of enhancement within the mid to posterior right frontal lobe white matter.  Chronic small vessel ischemic changes which are minimal  June 28, 2022: Scheduled follow-up for management of lung cancer.  Reviewed results of MRI with patient.    WBC 7.9 hemoglobin 13.4 platelet count 322; 59 segs 29 lymphs 8 monos 3 eos 1 basophil CMP notable for BUN of 28 creatinine 0.94.  July 11, 2022: Thoracic surgery consult  July 26 2022:  Right - ROBOTIC XI THORACOSCOPY, SURGICAL; WITH LOBECTOMY (SINGLE LOBE) BRONCHOSCPY, RIGID OR FLEXIBLE, W/FLUORO; WITH THERAPEUTIC ASPIRATION OF TRACHEOBRONCHIAL TREE, INITIAL Right - ROBOTIC XI THORACOSCOPY, SURGICAL; WITH MEDIASTINAL & REGIONAL LYMPHADENECTOMY   August 06 2022:    Has made a good postop recovery.  Will need final pathology from [facility]  To be seen for surgical follow up in 2 weeks.    Sep 04 2022:  Scheduled follow up regarding lung cancer.  Reviewed results of molecular studies with patient.   Not at 100% but active taking herself and household.   Tempus xT revealed Gene: 3236^EGFR^HGNC  Variant: [id number].  MSI Stable, PDL-1 negative  No mutations in  KRAS, BRAF, ALK, ROS1, RET, MET, ERBB2 (HER2).  Reviewed results of testing with patient.  Discussed use of adjuvant Osimertinib.    Sep 11 2022:  Reviewed recent history and pathology with patient and daughter.  The latter is questioning need for adjuvant therapy since the "surgeon never told her additional therapy was needed".  Daughter is also worried about "putting poison in my mother's body".  October 16 2022:  Feels well.    Active doing heavy housework such as pressure washing so long  as she does it in the morning before it gets too hot.  Will arrange for CT chest prior to return in 2 months.    November 26 2022:  CT CAP- Stable postop changes from right lower lobectomy. No evidence of recurrent or metastatic carcinoma within the chest, abdomen, or  pelvis. 5 mm right ureteral calculus has migrated distally to the right UVJ, with persistent moderate right hydroureteronephrosis.   November 28 2022:  Scheduled follow up regarding lung cancer.  Reviewed results of CT with patient.  She feels well except for being a bit winded with exertion     Review of Systems  Constitutional:  Negative for appetite change, chills, fatigue, fever and unexpected weight change.  HENT:   Negative for lump/mass, mouth sores, nosebleeds, sore throat, tinnitus, trouble swallowing and voice change.   Eyes:  Negative for eye problems and icterus.       Vision changes:  None  Respiratory:  Negative for chest tightness, hemoptysis, shortness of breath and wheezing.        Slight cough  Cardiovascular:  Negative for chest pain, leg swelling and palpitations.       PND:  none Orthopnea:  none  Gastrointestinal:  Negative for abdominal pain, blood in stool, constipation, diarrhea, nausea and vomiting.  Endocrine: Negative for hot flashes.       Cold intolerance:  none Heat intolerance:  none  Genitourinary:  Negative for bladder incontinence, difficulty urinating, dysuria, frequency, hematuria and nocturia.   Musculoskeletal:  Positive for arthralgias and back pain. Negative for gait problem, myalgias, neck pain and neck stiffness.  Skin:  Negative for itching, rash and wound.  Neurological:  Negative for dizziness, extremity weakness, gait problem, headaches, light-headedness, numbness, seizures and speech difficulty.  Hematological:  Negative for adenopathy. Does not bruise/bleed easily.  Psychiatric/Behavioral:  Negative for sleep disturbance and suicidal ideas. The patient is not nervous/anxious.      MEDICAL HISTORY: Past Medical History:  Diagnosis Date   Arthritis    Cataracts, bilateral    immature   Chronic back pain    spondylolisthesis   Complication of anesthesia    unable to get up due dizziness,nausea- stayed overnite   Constipation    takes Colace daily   Depression    but not on any meds   History of colon polyps    benign one time and precancerous another   History of colon polyps    History of kidney stones 12/2015   Hyperlipidemia    takes Pravastatin daily   Hypertension    takes Losartan daily   Hypothyroidism    takes Synthroid daily   Kidney stones    Numbness    tingling/right lower leg    OA (osteoarthritis)    PONV (postoperative nausea and vomiting)    Shoulder arthritis    Right    SURGICAL HISTORY: Past Surgical History:  Procedure Laterality Date   ABDOMINAL HYSTERECTOMY  2012   bladder tack   BACK SURGERY     BRONCHOSCOPY     COLONOSCOPY  2014   Polyps   LUNG BIOPSY     MAXIMUM ACCESS (  MAS)POSTERIOR LUMBAR INTERBODY FUSION (PLIF) 1 LEVEL N/A 07/19/2016   Procedure: LUMBAR FOUR-FIVE MAXIMUM ACCESS (MAS) POSTERIOR LUMBAR INTERBODY FUSION (PLIF), INSTRUMENTED LUMBAR THREE-FIVE;  Surgeon: Tia Alert, MD;  Location: [facility] OR;  Service: Neurosurgery;  Laterality: N/A;   REVERSE SHOULDER ARTHROPLASTY Right 06/13/2017   Procedure: Conversion of right total shoulder arthroplasty to reverse shoulder arthroplasty;  Surgeon: Francena Hanly, MD;  Location: [facility] OR;  Service: Orthopedics;  Laterality: Right;   SHOULDER SURGERY Left 2004   arthroscopy   SHOULDER SURGERY Right 06/13/2017    Conversion of right total shoulder arthroplasty to reverse shoulder arthroplasty (Right Shoulder)   TOTAL SHOULDER ARTHROPLASTY Left 01/19/2016   Procedure: TOTAL SHOULDER ARTHROPLASTY;  Surgeon: Francena Hanly, MD;  Location: [facility] OR;  Service: Orthopedics;  Laterality: Left;   TOTAL SHOULDER ARTHROPLASTY Right 04/18/2017   Procedure: RIGHT TOTAL SHOULDER  ARTHROPLASTY;  Surgeon: Francena Hanly, MD;  Location: [facility] OR;  Service: Orthopedics;  Laterality: Right;   TUBAL LIGATION  02/1983    SOCIAL HISTORY: Social History   Socioeconomic History   Marital status: Widowed    Spouse name: Not on file   Number of children: Not on file   Years of education: Not on file   Highest education level: Not on file  Occupational History   Not on file  Tobacco Use   Smoking status: Never    Passive exposure: Past   Smokeless tobacco: Never  Vaping Use   Vaping status: Never Used  Substance and Sexual Activity   Alcohol use: Yes    Alcohol/week: 0.0 standard drinks of alcohol    Comment: rarely   Drug use: No   Sexual activity: Not on file  Other Topics Concern   Not on file  Social History Narrative   Not on file   Social Determinants of Health   Financial Resource Strain: Not on file  Food Insecurity: No Food Insecurity (06/19/2022)   Hunger Vital Sign    Worried About Running Out of Food in the Last Year: Never true    Ran Out of Food in the Last Year: Never true  Transportation Needs: No Transportation Needs (06/19/2022)   PRAPARE - Administrator, Civil Service (Medical): No    Lack of Transportation (Non-Medical): No  Physical Activity: Not on file  Stress: No Stress Concern Present (05/09/2022)   Received from [facility] - Occupational Stress Questionnaire    Feeling of Stress : Only a little  Social Connections: Unknown (09/05/2021)   Received from [facility], [facility]   Social Network    Social Network: Not on file  Intimate Partner Violence: Not At Risk (06/19/2022)   Humiliation, Afraid, Rape, and Kick questionnaire    Fear of Current or Ex-Partner: No    Emotionally Abused: No    Physically Abused: No    Sexually Abused: No    FAMILY HISTORY Family History  Problem Relation Age of Onset   Lung cancer Mother    Lung cancer Father     Alcoholism Father    Alcoholism Brother    Skin cancer Daughter    Memory loss Half-Brother    Dementia Neg Hx     ALLERGIES:  has No Known Allergies.  MEDICATIONS:  Current Outpatient Medications  Medication Sig Dispense Refill   acetaminophen (TYLENOL) 650 MG CR tablet Take 650-1,300 mg by mouth as needed for pain.     Calcium-Vitamin D-Vitamin K 650-12.5-40 MG-MCG-MCG  CHEW Chew by mouth.     cholecalciferol (VITAMIN D3) 25 MCG (1000 UNIT) tablet Take 1,000 Units by mouth daily.     COLLAGEN PO Take by mouth. Takes daily     Cyanocobalamin (B-12 PO) Take by mouth.     diclofenac sodium (VOLTAREN) 1 % GEL Apply 1 application  topically as needed (joint pain).     L-LYSINE PO Take 0.5 tablets by mouth daily.      levothyroxine (SYNTHROID, LEVOTHROID) 50 MCG tablet Take 50 mcg by mouth daily before breakfast.      losartan (COZAAR) 25 MG tablet Take 25 mg by mouth once daily  0   magnesium 30 MG tablet Take 30 mg by mouth as needed.     Melatonin 10 MG TABS Take by mouth as needed.     meloxicam (MOBIC) 15 MG tablet Take 1 tablet by mouth daily.     Omega-3 Fatty Acids (FISH OIL) 1200 MG CAPS Take 1,200 mg by mouth every evening.     osimertinib mesylate (TAGRISSO) 80 MG tablet Take 1 tablet (80 mg total) by mouth daily. 30 tablet 5   oxyCODONE (OXY IR/ROXICODONE) 5 MG immediate release tablet Take by mouth.     pravastatin (PRAVACHOL) 40 MG tablet Take 40 mg by mouth at bedtime.      QC STOOL SOFTENER 100 MG capsule Take 100 mg by mouth 2 (two) times daily as needed.     tamsulosin (FLOMAX) 0.4 MG CAPS capsule Take 0.8 mg by mouth daily.     donepezil (ARICEPT) 5 MG tablet Take 1 tablet (5 mg total) by mouth in the morning. (Patient not taking: Reported on 11/05/2022) 90 tablet 3   Current Facility-Administered Medications  Medication Dose Route Frequency Provider Last Rate Last Admin   0.9 %  sodium chloride infusion  500 mL Intravenous Once Lynann Bologna, MD        PHYSICAL  EXAMINATION:  ECOG PERFORMANCE STATUS: 0 - Asymptomatic   There were no vitals filed for this visit.   There were no vitals filed for this visit.     Physical Exam Vitals and nursing note reviewed.  Constitutional:      General: She is not in acute distress.    Appearance: Normal appearance. She is normal weight. She is not ill-appearing, toxic-appearing or diaphoretic.     Comments: Here alone.  Comfortable  HENT:     Head: Normocephalic and atraumatic.     Right Ear: External ear normal.     Left Ear: External ear normal.     Nose: Nose normal. No congestion or rhinorrhea.  Eyes:     General: No scleral icterus.    Extraocular Movements: Extraocular movements intact.     Conjunctiva/sclera: Conjunctivae normal.     Pupils: Pupils are equal, round, and reactive to light.  Cardiovascular:     Rate and Rhythm: Normal rate and regular rhythm.     Heart sounds: No murmur heard.    No friction rub. No gallop.  Pulmonary:     Effort: Pulmonary effort is normal. No respiratory distress.     Breath sounds: No stridor. No wheezing or rhonchi.  Abdominal:     General: Bowel sounds are normal.     Palpations: Abdomen is soft.     Tenderness: There is no abdominal tenderness. There is no guarding or rebound.  Musculoskeletal:        General: No swelling, tenderness or deformity.     Cervical back: Normal  range of motion and neck supple. No rigidity or tenderness.     Right lower leg: No edema.     Left lower leg: No edema.  Lymphadenopathy:     Head:     Right side of head: No submental, submandibular, tonsillar, preauricular, posterior auricular or occipital adenopathy.     Left side of head: No submental, submandibular, tonsillar, preauricular, posterior auricular or occipital adenopathy.     Cervical: No cervical adenopathy.     Right cervical: No superficial, deep or posterior cervical adenopathy.    Left cervical: No superficial, deep or posterior cervical adenopathy.      Upper Body:     Right upper body: No supraclavicular, axillary, pectoral or epitrochlear adenopathy.     Left upper body: No supraclavicular, axillary, pectoral or epitrochlear adenopathy.  Skin:    General: Skin is warm.     Coloration: Skin is not jaundiced or pale.     Findings: No bruising or erythema.  Neurological:     General: No focal deficit present.     Mental Status: She is alert and oriented to person, place, and time. Mental status is at baseline.     Cranial Nerves: No cranial nerve deficit.     Motor: No weakness.     Gait: Gait normal.  Psychiatric:        Mood and Affect: Mood normal.        Behavior: Behavior normal.        Thought Content: Thought content normal.        Judgment: Judgment normal.      LABORATORY DATA: I have personally reviewed the data as listed:  Abstract on 11/27/2022  Component Date Value Ref Range Status   Hemoglobin 11/26/2022 14.1  12.0 - 16.0 Final   HCT 11/26/2022 42  36 - 46 Final   Neutrophils Absolute 11/26/2022 6.67   Final   Platelets 11/26/2022 386  150 - 400 K/uL Final   WBC 11/26/2022 10.1   Final   RBC 11/26/2022 4.50  3.87 - 5.11 Final   Glucose 11/26/2022 106   Final   BUN 11/26/2022 23 (A)  4 - 21 Final   CO2 11/26/2022 19  13 - 22 Final   Creatinine 11/26/2022 1.0  0.5 - 1.1 Final   Potassium 11/26/2022 4.3  3.5 - 5.1 mEq/L Final   Sodium 11/26/2022 140  137 - 147 Final   Chloride 11/26/2022 106  99 - 108 Final   Calcium 11/26/2022 9.5  8.7 - 10.7 Final   Albumin 11/26/2022 4.6  3.5 - 5.0 Final   Alkaline Phosphatase 11/26/2022 87  25 - 125 Final   ALT 11/26/2022 14  7 - 35 U/L Final   AST 11/26/2022 31  13 - 35 Final   Bilirubin, Total 11/26/2022 0.9   Final    RADIOGRAPHIC STUDIES: I have personally reviewed the radiological images as listed and agree with the findings in the report  No results found.  ASSESSMENT/PLAN 77 y.o. nonsmoking female with newly diagnosed lung cancer.  Medical history notable  for arthritis, cataracts, chronic back pain, colon polyps, hypertension, nephrolithiasis, neuropathy, shoulder arthroplasty, mild cognitive impairment  Adenocarcinoma, Right lung, lower lobe, Stage IB (T2 N0 M0) EGFR  [id number].  MSI Stable, PDL-1 negative  No mutations in  KRAS, BRAF, ALK, ROS1, RET, MET, ERBB2 (HER2).   March 07 2022:  CT PA demonstrated 2.9 cm spiculated mass superior segment RLL  April 20 2022:  PET CT showed 3  cm RLL mass (SUV 7.4).  No hypermetabolic mediastinal or hilar adenopathy. Subtle FDG uptake in both adrenal glands without underlying nodule or mass on CT imaging as such considered indeterminate.  May 03 2022:  PFTs. FEV1 158% FVC 150% FEV1/FVC 104%. DLCO 88.2%   June 11, 2022: CT-guided biopsy right lower lobe lung mass. Adenocarcinoma well-differentiated. IHC demonstrate cytokeratin 7 and TT F1 positivity   June 21 2022:  MRI brain negative for metastasis   Therapeutics:  Since patient has excellent performance status, good PFT's, disease which appears to be amenable to resection and good family support, recommended consultation with Thoracic surgery for consideration of resection.   July 26 2022- Right robotic thoracoscopy with mediastinal and regional lymphadenectomy  August 06, 2022: Recovering well from surgery.    Sep 04 2022- Given stage, and results of molecular studies recommended use of adjuvant Osimertinib Sep 10 2021- Reviewed literature results describing risks and benefits of Osimertinib.  Explained that there is not only a statistical benefit, but also a clinical one for administration of adjuvant TKI therapy.  Patient and daughter declined consideration of adjuvant treatment at this time but have agreed to long term follow up  October 16 2022- Fatigue gradually improving.  Continues to decline adjuvant Osmiertinib in favor of surveillance.  Arranging for follow up CT chest   November 26 2022- CT CAP NED.    November 28 2022- Reviewed  results of imaging and labs with patient.  Will obtain follow up CT Chest in 4 months    Chemotherapy Risks:  Discussed the potential side effects of chemotherapy with the patient.  These include but are not limited to:  Fatigue, Hair loss, low blood counts (anemia, thrombocytopenia) that may necessitate transfusion, bleeding, infection, nausea/ vomiting/Appetite changes/ Constipation/ Diarrhea, mucositis, Neuropathy/ neurologic problems, skin and nail changes such as dry skin and color change, Urine and bladder changes and kidney problems, weight changes, mood changes, decreased libido/fertility problems, damage to heart and lungs.  Some of these side effects can be life threatening, may be permanent and can result in hospitalization and/or death.  In shared decision making patient has agreed to proceed with chemotherapy.  Will refer patient for formal chemotherapy teaching.    Mild cognitive impairment:  This may predispose her to development of postoperative delirium  June 21 2022:  MRI brain --punctate focus of enhancement within the mid to posterior right frontal lobe white matter.  Chronic small vessel ischemic changes which are minimal   June 28 2022:  MRI findings suggest that patient may have had a vascular event that caused the memory impairment rather than a progressive disorder     Cancer Staging  Lung cancer [facility]) Staging form: Lung, AJCC 8th Edition - Clinical stage from 06/19/2022: Stage IA3 (cT1c, cN0, cM0) - Signed by Loni Muse, MD on 06/19/2022 Histopathologic type: Adenocarcinoma, NOS Stage prefix: Initial diagnosis Histologic grade (G): G1 Histologic grading system: 4 grade system - Pathologic stage from 08/13/2022: Stage IB (pT2a, pN0, cM0) - Signed by Loni Muse, MD on 08/13/2022 Histopathologic type: Adenocarcinoma, NOS    No problem-specific Assessment & Plan notes found for this encounter.    Orders Placed This Encounter  Procedures   CT Chest  W Contrast    Standing Status:   Future    Standing Expiration Date:   11/28/2023    Order Specific Question:   If indicated for the ordered procedure, I authorize the administration of contrast media per Radiology protocol  Answer:   Yes    Order Specific Question:   Does the patient have a contrast media/X-ray dye allergy?    Answer:   No    Order Specific Question:   Preferred imaging location?    Answer:   External    30  minutes was spent in patient care.  This included time spent preparing to see the patient (e.g., review of tests), obtaining and/or reviewing separately obtained history, counseling and educating the patient/family/caregiver, ordering medications, tests, or procedures; documenting clinical information in the electronic or other health record, independently interpreting results and communicating results to the patient/family/caregiver as well as coordination of care.       All questions were answered. The patient knows to call the clinic with any problems, questions or concerns.  This note was electronically signed.    Loni Muse, MD  12/06/2022 12:11 PM

## 2022-11-29 DIAGNOSIS — Z87442 Personal history of urinary calculi: Secondary | ICD-10-CM | POA: Diagnosis not present

## 2022-11-29 DIAGNOSIS — N201 Calculus of ureter: Secondary | ICD-10-CM | POA: Diagnosis not present

## 2022-12-06 ENCOUNTER — Encounter: Payer: Self-pay | Admitting: Oncology

## 2022-12-13 DIAGNOSIS — R3129 Other microscopic hematuria: Secondary | ICD-10-CM | POA: Diagnosis not present

## 2022-12-13 DIAGNOSIS — N201 Calculus of ureter: Secondary | ICD-10-CM | POA: Diagnosis not present

## 2022-12-13 DIAGNOSIS — N1330 Unspecified hydronephrosis: Secondary | ICD-10-CM | POA: Diagnosis not present

## 2022-12-17 ENCOUNTER — Other Ambulatory Visit: Payer: Medicare Other

## 2022-12-17 ENCOUNTER — Ambulatory Visit: Payer: Medicare Other | Admitting: Oncology

## 2022-12-21 DIAGNOSIS — N201 Calculus of ureter: Secondary | ICD-10-CM | POA: Diagnosis not present

## 2023-01-02 DIAGNOSIS — R3129 Other microscopic hematuria: Secondary | ICD-10-CM | POA: Diagnosis not present

## 2023-01-02 DIAGNOSIS — N201 Calculus of ureter: Secondary | ICD-10-CM | POA: Diagnosis not present

## 2023-03-26 DIAGNOSIS — E039 Hypothyroidism, unspecified: Secondary | ICD-10-CM | POA: Diagnosis not present

## 2023-03-26 DIAGNOSIS — Z0000 Encounter for general adult medical examination without abnormal findings: Secondary | ICD-10-CM | POA: Diagnosis not present

## 2023-03-26 DIAGNOSIS — Z6832 Body mass index (BMI) 32.0-32.9, adult: Secondary | ICD-10-CM | POA: Diagnosis not present

## 2023-03-26 DIAGNOSIS — E7800 Pure hypercholesterolemia, unspecified: Secondary | ICD-10-CM | POA: Diagnosis not present

## 2023-03-26 DIAGNOSIS — Z1331 Encounter for screening for depression: Secondary | ICD-10-CM | POA: Diagnosis not present

## 2023-03-26 DIAGNOSIS — I10 Essential (primary) hypertension: Secondary | ICD-10-CM | POA: Diagnosis not present

## 2023-03-26 DIAGNOSIS — Z79899 Other long term (current) drug therapy: Secondary | ICD-10-CM | POA: Diagnosis not present

## 2023-03-29 DIAGNOSIS — C3431 Malignant neoplasm of lower lobe, right bronchus or lung: Secondary | ICD-10-CM | POA: Diagnosis not present

## 2023-03-29 LAB — LAB REPORT - SCANNED: EGFR: 94

## 2023-04-01 ENCOUNTER — Inpatient Hospital Stay: Payer: Medicare Other

## 2023-04-01 ENCOUNTER — Inpatient Hospital Stay: Payer: Medicare Other | Attending: Oncology | Admitting: Oncology

## 2023-04-01 ENCOUNTER — Telehealth: Payer: Self-pay | Admitting: Oncology

## 2023-04-01 VITALS — BP 160/115 | HR 91 | Temp 98.4°F | Resp 18 | Ht 65.0 in | Wt 190.3 lb

## 2023-04-01 DIAGNOSIS — C3491 Malignant neoplasm of unspecified part of right bronchus or lung: Secondary | ICD-10-CM | POA: Insufficient documentation

## 2023-04-01 DIAGNOSIS — C3431 Malignant neoplasm of lower lobe, right bronchus or lung: Secondary | ICD-10-CM | POA: Diagnosis not present

## 2023-04-01 DIAGNOSIS — Z09 Encounter for follow-up examination after completed treatment for conditions other than malignant neoplasm: Secondary | ICD-10-CM | POA: Diagnosis not present

## 2023-04-01 MED ORDER — SODIUM CHLORIDE 0.9% FLUSH: 3.0000 mL | INTRAVENOUS | Status: DC | PRN

## 2023-04-01 NOTE — Telephone Encounter (Signed)
05/21/22 Next appt scheduled and confirmed with patient

## 2023-04-01 NOTE — Progress Notes (Unsigned)
[facility] Cancer Initial Visit:  Patient Care Team: Philemon Kingdom, MD as PCP - General (Internal Medicine)  CHIEF COMPLAINTS/PURPOSE OF CONSULTATION:  Oncology History  Lung cancer (HCC)  06/19/2022 Initial Diagnosis   Lung cancer (HCC)   06/19/2022 Cancer Staging   Staging form: Lung, AJCC 8th Edition - Clinical stage from 06/19/2022: Stage IA3 (cT1c, cN0, cM0) - Signed by Loni Muse, MD on 06/19/2022 Histopathologic type: Adenocarcinoma, NOS Stage prefix: Initial diagnosis Histologic grade (G): G1 Histologic grading system: 4 grade system   08/13/2022 Cancer Staging   Staging form: Lung, AJCC 8th Edition - Pathologic stage from 08/13/2022: Stage IB (pT2a, pN0, cM0) - Signed by Loni Muse, MD on 08/13/2022 Histopathologic type: Adenocarcinoma, NOS     HISTORY OF PRESENTING ILLNESS: [name] 77 y.o. female is here because of lung cancer Medical history notable for arthritis, cataracts, chronic back pain, colon polyps, hypertension, nephrolithiasis, neuropathy, shoulder arthroplasty, mild cognitive impairment  March 07, 2022: SOB and with cough.  CT PA.  Underlying mild emphysematous changes. Spiculated appearing 2.9 x 2.7 cm mass in the superior segment of the right  lower lobe is worrisome for neoplasm. No other pulmonary lesions or pulmonary nodules. No pleural effusions.   April 20, 2022: PET/CT.  3 cm right lower lobe pulmonary mass (SUV 7.4).  No hypermetabolic mediastinal or hilar adenopathy.  Subtle FDG uptake in both adrenal glands without underlying nodule or mass on CT imaging as such considered indeterminate.  May 03, 2022: PFTs.  FEV1 158% FVC 150% FEV1/FVC 104%.  DLCO 88.2%  May 09, 2022: Bronchoscopy with lavage, cytology and biopsy nondiagnostic  June 11, 2022: CT-guided biopsy right lower lobe lung mass.  Adenocarcinoma well-differentiated.  IHC demonstrate cytokeratin 7 and TT F1 positivity  June 19 2022:  [facility] Medical Oncology Consult  Patient feels well.  She is able to do pretty much what she wants to do.  Is a bit fatigued.    Social:  Widowed.  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  Lives by self.  No history of tobacco use.  EtOH none.    June 21, 2022: MRI of brain--punctate focus of enhancement within the mid to posterior right frontal lobe white matter.  Chronic small vessel ischemic changes which are minimal  June 28, 2022: Scheduled follow-up for management of lung cancer.  Reviewed results of MRI with patient.    WBC 7.9 hemoglobin 13.4 platelet count 322; 59 segs 29 lymphs 8 monos 3 eos 1 basophil CMP notable for BUN of 28 creatinine 0.94.  July 11, 2022: Thoracic surgery consult  July 26 2022:  Right - ROBOTIC XI THORACOSCOPY, SURGICAL; WITH LOBECTOMY (SINGLE LOBE) BRONCHOSCPY, RIGID OR FLEXIBLE, W/FLUORO; WITH THERAPEUTIC ASPIRATION OF TRACHEOBRONCHIAL TREE, INITIAL Right - ROBOTIC XI THORACOSCOPY, SURGICAL; WITH MEDIASTINAL & REGIONAL LYMPHADENECTOMY   August 06 2022:    Has made a good postop recovery.  Will need final pathology from [facility].  To be seen for surgical follow up in 2 weeks.    Sep 04 2022:  Scheduled follow up regarding lung cancer.  Reviewed results of molecular studies with patient.   Not at 100% but active taking herself and household.   Tempus xT revealed Gene: 3236^EGFR^HGNC  Variant: [id number].  MSI Stable, PDL-1 negative  No mutations in  KRAS, BRAF, ALK, ROS1, RET, MET, ERBB2 (HER2).  Reviewed results of testing with patient.  Discussed use of adjuvant Osimertinib.    Sep 11 2022:  Reviewed recent history and pathology with patient and daughter.  The latter is questioning need for adjuvant therapy since the "surgeon never told her additional therapy was needed".  Daughter is also worried about "putting poison in my mother's body".  October 16 2022:  Feels well.    Active doing heavy housework such as pressure washing so long  as she does it in the morning before it gets too hot.  Will arrange for CT chest prior to return in 2 months.    November 26 2022:  CT CAP- Stable postop changes from right lower lobectomy. No evidence of recurrent or metastatic carcinoma within the chest, abdomen, or  pelvis. 5 mm right ureteral calculus has migrated distally to the right UVJ, with persistent moderate right hydroureteronephrosis.   November 28 2022:   Reviewed results of CT with patient.  She feels well except for being a bit winded with exertion    March 29 2023:  CT Chest- No recurrence  April 01 2023:  Scheduled follow up regarding lung cancer.    Review of Systems  Constitutional:  Negative for appetite change, chills, fatigue, fever and unexpected weight change.  HENT:   Negative for lump/mass, mouth sores, nosebleeds, sore throat, tinnitus, trouble swallowing and voice change.   Eyes:  Negative for eye problems and icterus.       Vision changes:  None  Respiratory:  Negative for chest tightness, hemoptysis, shortness of breath and wheezing.        Slight cough  Cardiovascular:  Negative for chest pain, leg swelling and palpitations.       PND:  none Orthopnea:  none  Gastrointestinal:  Negative for abdominal pain, blood in stool, constipation, diarrhea, nausea and vomiting.  Endocrine: Negative for hot flashes.       Cold intolerance:  none Heat intolerance:  none  Genitourinary:  Negative for bladder incontinence, difficulty urinating, dysuria, frequency, hematuria and nocturia.   Musculoskeletal:  Positive for arthralgias and back pain. Negative for gait problem, myalgias, neck pain and neck stiffness.  Skin:  Negative for itching, rash and wound.  Neurological:  Negative for dizziness, extremity weakness, gait problem, headaches, light-headedness, numbness, seizures and speech difficulty.  Hematological:  Negative for adenopathy. Does not bruise/bleed easily.  Psychiatric/Behavioral:  Negative for sleep  disturbance and suicidal ideas. The patient is not nervous/anxious.     MEDICAL HISTORY: Past Medical History:  Diagnosis Date   Arthritis    Cataracts, bilateral    immature   Chronic back pain    spondylolisthesis   Complication of anesthesia    unable to get up due dizziness,nausea- stayed overnite   Constipation    takes Colace daily   Depression    but not on any meds   History of colon polyps    benign one time and precancerous another   History of colon polyps    History of kidney stones 12/2015   Hyperlipidemia    takes Pravastatin daily   Hypertension    takes Losartan daily   Hypothyroidism    takes Synthroid daily   Kidney stones    Numbness    tingling/right lower leg    OA (osteoarthritis)    PONV (postoperative nausea and vomiting)    Shoulder arthritis    Right    SURGICAL HISTORY: Past Surgical History:  Procedure Laterality Date   ABDOMINAL HYSTERECTOMY  2012   bladder tack   BACK SURGERY     BRONCHOSCOPY     COLONOSCOPY  2014   Polyps   LUNG BIOPSY     MAXIMUM ACCESS (MAS)POSTERIOR LUMBAR INTERBODY FUSION (PLIF) 1 LEVEL N/A 07/19/2016   Procedure: LUMBAR FOUR-FIVE MAXIMUM ACCESS (MAS) POSTERIOR LUMBAR INTERBODY FUSION (PLIF), INSTRUMENTED LUMBAR THREE-FIVE;  Surgeon: Tia Alert, MD;  Location: [facility] OR;  Service: Neurosurgery;  Laterality: N/A;   REVERSE SHOULDER ARTHROPLASTY Right 06/13/2017   Procedure: Conversion of right total shoulder arthroplasty to reverse shoulder arthroplasty;  Surgeon: Francena Hanly, MD;  Location: [facility] OR;  Service: Orthopedics;  Laterality: Right;   SHOULDER SURGERY Left 2004   arthroscopy   SHOULDER SURGERY Right 06/13/2017    Conversion of right total shoulder arthroplasty to reverse shoulder arthroplasty (Right Shoulder)   TOTAL SHOULDER ARTHROPLASTY Left 01/19/2016   Procedure: TOTAL SHOULDER ARTHROPLASTY;  Surgeon: Francena Hanly, MD;  Location: [facility] OR;  Service: Orthopedics;  Laterality: Left;   TOTAL SHOULDER  ARTHROPLASTY Right 04/18/2017   Procedure: RIGHT TOTAL SHOULDER ARTHROPLASTY;  Surgeon: Francena Hanly, MD;  Location: [facility] OR;  Service: Orthopedics;  Laterality: Right;   TUBAL LIGATION  02/1983    SOCIAL HISTORY: Social History   Socioeconomic History   Marital status: Widowed    Spouse name: Not on file   Number of children: Not on file   Years of education: Not on file   Highest education level: Not on file  Occupational History   Not on file  Tobacco Use   Smoking status: Never    Passive exposure: Past   Smokeless tobacco: Never  Vaping Use   Vaping status: Never Used  Substance and Sexual Activity   Alcohol use: Yes    Alcohol/week: 0.0 standard drinks of alcohol    Comment: rarely   Drug use: No   Sexual activity: Not on file  Other Topics Concern   Not on file  Social History Narrative   Not on file   Social Determinants of Health   Financial Resource Strain: Not on file  Food Insecurity: No Food Insecurity (06/19/2022)   Hunger Vital Sign    Worried About Running Out of Food in the Last Year: Never true    Ran Out of Food in the Last Year: Never true  Transportation Needs: No Transportation Needs (06/19/2022)   PRAPARE - Administrator, Civil Service (Medical): No    Lack of Transportation (Non-Medical): No  Physical Activity: Not on file  Stress: No Stress Concern Present (05/09/2022)   Received from [facility], [facility] of Occupational Health - Occupational Stress Questionnaire    Feeling of Stress : Only a little  Social Connections: Unknown (09/05/2021)   Received from [facility], [facility]   Social Network    Social Network: Not on file  Intimate Partner Violence: Not At Risk (06/19/2022)   Humiliation, Afraid, Rape, and Kick questionnaire    Fear of Current or Ex-Partner: No    Emotionally Abused: No    Physically Abused: No    Sexually Abused: No    FAMILY HISTORY Family History  Problem Relation  Age of Onset   Lung cancer Mother    Lung cancer Father    Alcoholism Father    Alcoholism Brother    Skin cancer Daughter    Memory loss Half-Brother    Dementia Neg Hx     ALLERGIES:  has No Known Allergies.  MEDICATIONS:  Current Outpatient Medications  Medication Sig Dispense Refill   acetaminophen (TYLENOL) 650 MG CR tablet Take 650-1,300 mg by  mouth as needed for pain.     Calcium-Vitamin D-Vitamin K 650-12.5-40 MG-MCG-MCG CHEW Chew by mouth.     cholecalciferol (VITAMIN D3) 25 MCG (1000 UNIT) tablet Take 1,000 Units by mouth daily.     COLLAGEN PO Take by mouth. Takes daily     Cyanocobalamin (B-12 PO) Take by mouth.     diclofenac sodium (VOLTAREN) 1 % GEL Apply 1 application  topically as needed (joint pain).     donepezil (ARICEPT) 5 MG tablet Take 1 tablet (5 mg total) by mouth in the morning. (Patient not taking: Reported on 11/05/2022) 90 tablet 3   L-LYSINE PO Take 0.5 tablets by mouth daily.      levothyroxine (SYNTHROID, LEVOTHROID) 50 MCG tablet Take 50 mcg by mouth daily before breakfast.      losartan (COZAAR) 25 MG tablet Take 25 mg by mouth once daily  0   magnesium 30 MG tablet Take 30 mg by mouth as needed.     Melatonin 10 MG TABS Take by mouth as needed.     meloxicam (MOBIC) 15 MG tablet Take 1 tablet by mouth daily.     Omega-3 Fatty Acids (FISH OIL) 1200 MG CAPS Take 1,200 mg by mouth every evening.     oxyCODONE (OXY IR/ROXICODONE) 5 MG immediate release tablet Take by mouth.     pravastatin (PRAVACHOL) 40 MG tablet Take 40 mg by mouth at bedtime.      QC STOOL SOFTENER 100 MG capsule Take 100 mg by mouth 2 (two) times daily as needed.     tamsulosin (FLOMAX) 0.4 MG CAPS capsule Take 0.8 mg by mouth daily.     Current Facility-Administered Medications  Medication Dose Route Frequency Provider Last Rate Last Admin   0.9 %  sodium chloride infusion  500 mL Intravenous Once Lynann Bologna, MD       sodium chloride flush (NS) 0.9 % injection 3 mL  3 mL  Intravenous PRN Nayeli Calvert, Cindie Crumbly, MD        PHYSICAL EXAMINATION:  ECOG PERFORMANCE STATUS: 0 - Asymptomatic   There were no vitals filed for this visit.   There were no vitals filed for this visit.     Physical Exam Vitals and nursing note reviewed.  Constitutional:      General: She is not in acute distress.    Appearance: Normal appearance. She is normal weight. She is not ill-appearing, toxic-appearing or diaphoretic.     Comments: Here alone.  Comfortable  HENT:     Head: Normocephalic and atraumatic.     Right Ear: External ear normal.     Left Ear: External ear normal.     Nose: Nose normal. No congestion or rhinorrhea.  Eyes:     General: No scleral icterus.    Extraocular Movements: Extraocular movements intact.     Conjunctiva/sclera: Conjunctivae normal.     Pupils: Pupils are equal, round, and reactive to light.  Cardiovascular:     Rate and Rhythm: Normal rate and regular rhythm.     Heart sounds: No murmur heard.    No friction rub. No gallop.  Pulmonary:     Effort: Pulmonary effort is normal. No respiratory distress.     Breath sounds: No stridor. No wheezing or rhonchi.  Abdominal:     General: Bowel sounds are normal.     Palpations: Abdomen is soft.     Tenderness: There is no abdominal tenderness. There is no guarding or rebound.  Musculoskeletal:  General: No swelling, tenderness or deformity.     Cervical back: Normal range of motion and neck supple. No rigidity or tenderness.     Right lower leg: No edema.     Left lower leg: No edema.  Lymphadenopathy:     Head:     Right side of head: No submental, submandibular, tonsillar, preauricular, posterior auricular or occipital adenopathy.     Left side of head: No submental, submandibular, tonsillar, preauricular, posterior auricular or occipital adenopathy.     Cervical: No cervical adenopathy.     Right cervical: No superficial, deep or posterior cervical adenopathy.    Left cervical:  No superficial, deep or posterior cervical adenopathy.     Upper Body:     Right upper body: No supraclavicular, axillary, pectoral or epitrochlear adenopathy.     Left upper body: No supraclavicular, axillary, pectoral or epitrochlear adenopathy.  Skin:    General: Skin is warm.     Coloration: Skin is not jaundiced or pale.     Findings: No bruising or erythema.  Neurological:     General: No focal deficit present.     Mental Status: She is alert and oriented to person, place, and time. Mental status is at baseline.     Cranial Nerves: No cranial nerve deficit.     Motor: No weakness.     Gait: Gait normal.  Psychiatric:        Mood and Affect: Mood normal.        Behavior: Behavior normal.        Thought Content: Thought content normal.        Judgment: Judgment normal.      LABORATORY DATA: I have personally reviewed the data as listed:  No visits with results within 1 Month(s) from this visit.  Latest known visit with results is:  Abstract on 11/27/2022  Component Date Value Ref Range Status   Hemoglobin 11/26/2022 14.1  12.0 - 16.0 Final   HCT 11/26/2022 42  36 - 46 Final   Neutrophils Absolute 11/26/2022 6.67   Final   Platelets 11/26/2022 386  150 - 400 K/uL Final   WBC 11/26/2022 10.1   Final   RBC 11/26/2022 4.50  3.87 - 5.11 Final   Glucose 11/26/2022 106   Final   BUN 11/26/2022 23 (A)  4 - 21 Final   CO2 11/26/2022 19  13 - 22 Final   Creatinine 11/26/2022 1.0  0.5 - 1.1 Final   Potassium 11/26/2022 4.3  3.5 - 5.1 mEq/L Final   Sodium 11/26/2022 140  137 - 147 Final   Chloride 11/26/2022 106  99 - 108 Final   Calcium 11/26/2022 9.5  8.7 - 10.7 Final   Albumin 11/26/2022 4.6  3.5 - 5.0 Final   EGFR 11/26/2022 54.0   Final   Abstracted by HIM   Alkaline Phosphatase 11/26/2022 87  25 - 125 Final   ALT 11/26/2022 14  7 - 35 U/L Final   AST 11/26/2022 31  13 - 35 Final   Bilirubin, Total 11/26/2022 0.9   Final    RADIOGRAPHIC STUDIES: I have personally  reviewed the radiological images as listed and agree with the findings in the report  No results found.  ASSESSMENT/PLAN 77 y.o. nonsmoking female with newly diagnosed lung cancer.  Medical history notable for arthritis, cataracts, chronic back pain, colon polyps, hypertension, nephrolithiasis, neuropathy, shoulder arthroplasty, mild cognitive impairment  Adenocarcinoma, Right lung, lower lobe, Stage IB (T2 N0 M0) EGFR  [id number].  MSI Stable, PDL-1 negative  No mutations in  KRAS, BRAF, ALK, ROS1, RET, MET, ERBB2 (HER2).   March 07 2022:  CT PA demonstrated 2.9 cm spiculated mass superior segment RLL  April 20 2022:  PET CT showed 3 cm RLL mass (SUV 7.4).  No hypermetabolic mediastinal or hilar adenopathy. Subtle FDG uptake in both adrenal glands without underlying nodule or mass on CT imaging as such considered indeterminate.  May 03 2022:  PFTs. FEV1 158% FVC 150% FEV1/FVC 104%. DLCO 88.2%   June 11, 2022: CT-guided biopsy right lower lobe lung mass. Adenocarcinoma well-differentiated. IHC demonstrate cytokeratin 7 and TT F1 positivity   June 21 2022:  MRI brain negative for metastasis   Therapeutics:  Since patient has excellent performance status, good PFT's, disease which appears to be amenable to resection and good family support, recommended consultation with Thoracic surgery for consideration of resection.   July 26 2022- Right robotic thoracoscopy with mediastinal and regional lymphadenectomy  August 06, 2022: Recovering well from surgery.    Sep 04 2022- Given stage, and results of molecular studies recommended use of adjuvant Osimertinib Sep 10 2021- Reviewed literature results describing risks and benefits of Osimertinib.  Explained that there is not only a statistical benefit, but also a clinical one for administration of adjuvant TKI therapy.  Patient and daughter declined consideration of adjuvant treatment at this time but have agreed to long term follow  up  October 16 2022- Fatigue gradually improving.  Continues to decline adjuvant Osmiertinib in favor of surveillance.  Arranging for follow up CT chest   November 26 2022- CT CAP NED.    November 28 2022- Reviewed results of imaging and labs with patient.  Will obtain follow up CT Chest in 4 months    Chemotherapy Risks:  Discussed the potential side effects of chemotherapy with the patient.  These include but are not limited to:  Fatigue, Hair loss, low blood counts (anemia, thrombocytopenia) that may necessitate transfusion, bleeding, infection, nausea/ vomiting/Appetite changes/ Constipation/ Diarrhea, mucositis, Neuropathy/ neurologic problems, skin and nail changes such as dry skin and color change, Urine and bladder changes and kidney problems, weight changes, mood changes, decreased libido/fertility problems, damage to heart and lungs.  Some of these side effects can be life threatening, may be permanent and can result in hospitalization and/or death.  In shared decision making patient has agreed to proceed with chemotherapy.  Will refer patient for formal chemotherapy teaching.    Mild cognitive impairment:  This may predispose her to development of postoperative delirium  June 21 2022:  MRI brain --punctate focus of enhancement within the mid to posterior right frontal lobe white matter.  Chronic small vessel ischemic changes which are minimal   June 28 2022:  MRI findings suggest that patient may have had a vascular event that caused the memory impairment rather than a progressive disorder     Cancer Staging  Lung cancer [facility]) Staging form: Lung, AJCC 8th Edition - Clinical stage from 06/19/2022: Stage IA3 (cT1c, cN0, cM0) - Signed by Loni Muse, MD on 06/19/2022 Histopathologic type: Adenocarcinoma, NOS Stage prefix: Initial diagnosis Histologic grade (G): G1 Histologic grading system: 4 grade system - Pathologic stage from 08/13/2022: Stage IB (pT2a, pN0, cM0) - Signed by  Loni Muse, MD on 08/13/2022 Histopathologic type: Adenocarcinoma, NOS    No problem-specific Assessment & Plan notes found for this encounter.    No orders of the defined types were placed in this encounter.  30  minutes was spent in patient care.  This included time spent preparing to see the patient (e.g., review of tests), obtaining and/or reviewing separately obtained history, counseling and educating the patient/family/caregiver, ordering medications, tests, or procedures; documenting clinical information in the electronic or other health record, independently interpreting results and communicating results to the patient/family/caregiver as well as coordination of care.       All questions were answered. The patient knows to call the clinic with any problems, questions or concerns.  This note was electronically signed.    Loni Muse, MD  04/01/2023 2:07 PM

## 2023-04-08 ENCOUNTER — Encounter: Payer: Self-pay | Admitting: Oncology

## 2023-05-06 DIAGNOSIS — N201 Calculus of ureter: Secondary | ICD-10-CM | POA: Diagnosis not present

## 2023-05-06 DIAGNOSIS — R3129 Other microscopic hematuria: Secondary | ICD-10-CM | POA: Diagnosis not present

## 2023-05-15 NOTE — Addendum Note (Signed)
Addended byDyane Dustman on: 05/15/2023 04:22 PM   Modules accepted: Orders

## 2023-09-02 ENCOUNTER — Telehealth: Payer: Self-pay | Admitting: Oncology

## 2023-09-02 NOTE — Addendum Note (Signed)
 Addended by: Earma Gloss on: 09/02/2023 09:05 AM   Modules accepted: Orders

## 2023-09-02 NOTE — Telephone Encounter (Signed)
 09/02/23 Spoke with patient and confirmed ct chest at [facility] on 09/05/23 arrive at 12noon.Appt at 1pm.

## 2023-09-05 DIAGNOSIS — C3431 Malignant neoplasm of lower lobe, right bronchus or lung: Secondary | ICD-10-CM | POA: Diagnosis not present

## 2023-09-05 LAB — BASIC METABOLIC PANEL WITH GFR: EGFR: 66

## 2023-09-10 ENCOUNTER — Encounter: Payer: Self-pay | Admitting: Oncology

## 2023-09-19 ENCOUNTER — Other Ambulatory Visit (HOSPITAL_BASED_OUTPATIENT_CLINIC_OR_DEPARTMENT_OTHER): Admitting: Radiology

## 2023-09-19 DIAGNOSIS — G309 Alzheimer's disease, unspecified: Secondary | ICD-10-CM | POA: Diagnosis not present

## 2023-09-29 NOTE — Progress Notes (Signed)
 [facility] [name]  [address] [place],  [place]  [address] [phone]  Clinic Day:  09/30/2023  Referring physician: Olan Bering, MD   HISTORY OF PRESENT ILLNESS:  The patient is a 78 y.o. female with stage IB (T2a N0 M0) EGFR+ lung adenocarcinoma, status post a right lower lobectomy in April 2024.  She comes in today for routine follow-up.  Since her last visit, the patient has been doing very well.  She denies having any new respiratory symptoms which concern her for possible disease recurrence.  Of note, a chest CT in May 2025 showed no evidence of disease recurrence.    PHYSICAL EXAM:  Blood pressure (!) 160/82, pulse 72, temperature 98.2 F (36.8 C), temperature source Oral, resp. rate 16, height 5\' 5"  (1.651 m), weight 189 lb 1.6 oz (85.8 kg), SpO2 95%. Wt Readings from Last 3 Encounters:  09/30/23 189 lb 1.6 oz (85.8 kg)  04/01/23 190 lb 4.8 oz (86.3 kg)  11/05/22 189 lb (85.7 kg)   Body mass index is 31.47 kg/m. Performance status (ECOG): 1 - Symptomatic but completely ambulatory Physical Exam Constitutional:      Appearance: Normal appearance. She is not ill-appearing.  HENT:     Mouth/Throat:     Mouth: Mucous membranes are moist.     Pharynx: Oropharynx is clear. No oropharyngeal exudate or posterior oropharyngeal erythema.  Cardiovascular:     Rate and Rhythm: Normal rate and regular rhythm.     Heart sounds: No murmur heard.    No friction rub. No gallop.  Pulmonary:     Effort: Pulmonary effort is normal. No respiratory distress.     Breath sounds: Normal breath sounds. No wheezing, rhonchi or rales.  Abdominal:     General: Bowel sounds are normal. There is no distension.     Palpations: Abdomen is soft. There is no mass.     Tenderness: There is no abdominal tenderness.  Musculoskeletal:        General: No swelling.     Right lower leg: No edema.     Left lower leg: No edema.  Lymphadenopathy:     Cervical: No  cervical adenopathy.     Upper Body:     Right upper body: No supraclavicular or axillary adenopathy.     Left upper body: No supraclavicular or axillary adenopathy.     Lower Body: No right inguinal adenopathy. No left inguinal adenopathy.  Skin:    General: Skin is warm.     Coloration: Skin is not jaundiced.     Findings: No lesion or rash.  Neurological:     General: No focal deficit present.     Mental Status: She is alert and oriented to person, place, and time. Mental status is at baseline.  Psychiatric:        Mood and Affect: Mood normal.        Behavior: Behavior normal.        Thought Content: Thought content normal.   LABS:      Latest Ref Rng & Units 11/26/2022   12:00 AM 10/16/2022    4:29 PM 09/04/2022   12:24 PM  CBC  WBC  10.1     11.2  8.0   Hemoglobin 12.0 - 16.0 14.1     14.0  14.6   Hematocrit 36 - 46 42     43.2  43.1   Platelets 150 - 400 K/uL 386     355  297  This result is from an external source.      Latest Ref Rng & Units 11/26/2022   12:00 AM 10/16/2022    4:29 PM 09/04/2022   12:24 PM  CMP  Glucose 70 - 99 mg/dL  098  119   BUN 4 - 21 23     26  29    Creatinine 0.5 - 1.1 1.0     0.98  0.75   Sodium 137 - 147 140     139  139   Potassium 3.5 - 5.1 mEq/L 4.3     3.8  4.0   Chloride 99 - 108 106     106  106   CO2 13 - 22 19     24  25    Calcium 8.7 - 10.7 9.5     9.6  9.3   Total Protein 6.5 - 8.1 g/dL  7.2  7.5   Total Bilirubin 0.3 - 1.2 mg/dL  0.6  0.6   Alkaline Phos 25 - 125 87     99  95   AST 13 - 35 31     18  14    ALT 7 - 35 U/L 14     14  15       This result is from an external source.   PATHOLOGY:  Results from her right lower lobectomy in April 2024 revealed the following: Diagnosis   A: Lymph node, 4R, excision - One lymph node negative for carcinoma (0/1)   B: Lymph node, 12R, excision - One lymph node negative for carcinoma (0/1)   C: Lymph node, 13R, excision - One lymph node negative for carcinoma (0/1)   D:  Lymph node, 8R, excision - One lymph node negative for carcinoma (0/1)   E: Lymph node, 9R, excision - One lymph node negative for carcinoma (0/1)   F: Lymph node, 10R, excision - One lymph node negative for carcinoma (0/1)   G: Lymph node, 8R #2, excision - One lymph node negative for carcinoma (0/1)   H: Lymph node, level 7, excision - One lymph node negative for carcinoma (0/1)   I: Lymph node, 11R, excision - One lymph node negative for carcinoma (0/1)   J: Lymph node, 10R #2, excision - One lymph node negative for carcinoma (0/1)   K: Lymph node, 12R #2, excision - One lymph node negative for carcinoma (0/1)   L: Right lower lung lobe, lobectomy - Invasive adenocarcinoma, acinar predominant with micropapillary and papillary components (see comment and synoptic report)              - Tumor measures 3.6 cm and abuts but does not invade the visceral pleura              - No lymphovascular space or perineural invasion identified              - Spread through air spaces identified              - Margins negative - Eleven lymph nodes negative for carcinoma (0/11) - Pathologic stage:  pT2a N0   M: Lymph node, 10R #3, excision  - One lymph node negative for carcinoma (0/1)   STUDIES:  Her chest CT from May 2025 revealed the following:     ASSESSMENT & PLAN:  Assessment/Plan:  A 78 y.o. female with stage IB (T2a N0 M0) EGFR+ lung adenocarcinoma, status post a right lower lobectomy in April 2024.  In clinic today, I went over  all of her images with her from her recent chest CT, for which she could see that she remains cancer free.  Clinically, patient continues to do very well.  Moving forward, I will begin alternating studies to where she will receive a chest x-ray at her next visit.  For the visit after that, she will receive a chest CT.  I will see her back in 4 months, with a chest x-ray being done on the day of her next visit for her continued radiographic lung cancer  surveillance.  The patient understands all the plans discussed today and is in agreement with them.    Dam Ashraf Felicia Horde, MD

## 2023-09-29 NOTE — Progress Notes (Incomplete)
 [facility]  [address] [place],  [place]  [address] [phone]  Clinic Day:  09/30/2023  Referring physician: Olan Bering, MD   HISTORY OF PRESENT ILLNESS:  The patient is a 78 y.o. female with stage IB (T2a N0 M0) lung adenocarcinoma, status post a right lower lobectomy in April 2024.     PHYSICAL EXAM:   There were no vitals taken for this visit. Wt Readings from Last 3 Encounters:  04/01/23 190 lb 4.8 oz (86.3 kg)  11/05/22 189 lb (85.7 kg)  10/16/22 187 lb 14.4 oz (85.2 kg)   There is no height or weight on file to calculate BMI. Performance status (ECOG): {CHL ONC [id number] Physical Exam  LABS:      Latest Ref Rng & Units 11/26/2022   12:00 AM 10/16/2022    4:29 PM 09/04/2022   12:24 PM  CBC  WBC  10.1     11.2  8.0   Hemoglobin 12.0 - 16.0 14.1     14.0  14.6   Hematocrit 36 - 46 42     43.2  43.1   Platelets 150 - 400 K/uL 386     355  297      This result is from an external source.      Latest Ref Rng & Units 11/26/2022   12:00 AM 10/16/2022    4:29 PM 09/04/2022   12:24 PM  CMP  Glucose 70 - 99 mg/dL  086  578   BUN 4 - 21 23     26  29    Creatinine 0.5 - 1.1 1.0     0.98  0.75   Sodium 137 - 147 140     139  139   Potassium 3.5 - 5.1 mEq/L 4.3     3.8  4.0   Chloride 99 - 108 106     106  106   CO2 13 - 22 19     24  25    Calcium 8.7 - 10.7 9.5     9.6  9.3   Total Protein 6.5 - 8.1 g/dL  7.2  7.5   Total Bilirubin 0.3 - 1.2 mg/dL  0.6  0.6   Alkaline Phos 25 - 125 87     99  95   AST 13 - 35 31     18  14    ALT 7 - 35 U/L 14     14  15       This result is from an external source.     Lab Results  Component Value Date   CEA1 3.2 10/16/2022   CEA1 5.4 (H) 06/26/2022   /  CEA  Date Value Ref Range Status  10/16/2022 3.2 0.0 - 4.7 ng/mL Final    Comment:    (NOTE)                             Nonsmokers          <3.9                             Smokers             <5.6 [employer]  Electrochemiluminescence Immunoassay (ECLIA) Values obtained with different assay methods or kits cannot be used interchangeably.  Results cannot be interpreted as absolute evidence of the presence or absence of malignant disease.  Performed At: [facility] [address] [place], [place] [id number] Pearlean Botts MD PP:[id number]    No results found for: "PSA1" No results found for: [phone]" No results found for: "CAN125"  No results found for: "TOTALPROTELP", "ALBUMINELP", "A1GS", "A2GS", "BETS", "BETA2SER", "GAMS", "MSPIKE", "SPEI" No results found for: "TIBC", "FERRITIN", "IRONPCTSAT" No results found for: "LDH"     Component Value Date/Time   CEA1 3.2 10/16/2022 1629    Review Flowsheet       Latest Ref Rng & Units 06/26/2022 10/16/2022  Oncology [facility]  CEA 0.0 - 4.7 ng/mL 5.4  3.2      STUDIES:  No results found.    ASSESSMENT & PLAN:   Assessment/Plan:  A 78 y.o. female with *** .The patient understands all the plans discussed today and is in agreement with them.      Bruce Churilla Felicia Horde, MD

## 2023-09-30 ENCOUNTER — Inpatient Hospital Stay: Payer: Medicare Other | Attending: Oncology | Admitting: Oncology

## 2023-09-30 ENCOUNTER — Other Ambulatory Visit: Payer: Self-pay | Admitting: Oncology

## 2023-09-30 ENCOUNTER — Telehealth: Payer: Self-pay | Admitting: Oncology

## 2023-09-30 ENCOUNTER — Ambulatory Visit: Payer: Medicare Other | Admitting: Oncology

## 2023-09-30 VITALS — BP 160/82 | HR 72 | Temp 98.2°F | Resp 16 | Ht 65.0 in | Wt 189.1 lb

## 2023-09-30 DIAGNOSIS — Z85118 Personal history of other malignant neoplasm of bronchus and lung: Secondary | ICD-10-CM | POA: Diagnosis not present

## 2023-09-30 DIAGNOSIS — Z902 Acquired absence of lung [part of]: Secondary | ICD-10-CM | POA: Diagnosis not present

## 2023-09-30 DIAGNOSIS — C3431 Malignant neoplasm of lower lobe, right bronchus or lung: Secondary | ICD-10-CM

## 2023-09-30 NOTE — Telephone Encounter (Signed)
 Patient has been scheduled for follow-up visit per 09/30/23 LOS.  Pt given an appt calendar with date and time.

## 2023-10-01 DIAGNOSIS — Z79899 Other long term (current) drug therapy: Secondary | ICD-10-CM | POA: Diagnosis not present

## 2023-10-01 DIAGNOSIS — I10 Essential (primary) hypertension: Secondary | ICD-10-CM | POA: Diagnosis not present

## 2023-10-01 DIAGNOSIS — E039 Hypothyroidism, unspecified: Secondary | ICD-10-CM | POA: Diagnosis not present

## 2023-10-01 DIAGNOSIS — H04129 Dry eye syndrome of unspecified lacrimal gland: Secondary | ICD-10-CM | POA: Diagnosis not present

## 2023-10-01 DIAGNOSIS — E7800 Pure hypercholesterolemia, unspecified: Secondary | ICD-10-CM | POA: Diagnosis not present

## 2023-10-01 DIAGNOSIS — B351 Tinea unguium: Secondary | ICD-10-CM | POA: Diagnosis not present

## 2023-10-13 ENCOUNTER — Encounter (HOSPITAL_BASED_OUTPATIENT_CLINIC_OR_DEPARTMENT_OTHER): Payer: Self-pay

## 2023-10-13 ENCOUNTER — Ambulatory Visit (HOSPITAL_BASED_OUTPATIENT_CLINIC_OR_DEPARTMENT_OTHER)
Admission: EM | Admit: 2023-10-13 | Discharge: 2023-10-13 | Disposition: A | Discharge status: Home / Self Care | Attending: Family Medicine | Admitting: Family Medicine

## 2023-10-13 ENCOUNTER — Ambulatory Visit (HOSPITAL_BASED_OUTPATIENT_CLINIC_OR_DEPARTMENT_OTHER): Admitting: Radiology

## 2023-10-13 DIAGNOSIS — W19XXXA Unspecified fall, initial encounter: Secondary | ICD-10-CM

## 2023-10-13 DIAGNOSIS — Z043 Encounter for examination and observation following other accident: Secondary | ICD-10-CM | POA: Diagnosis not present

## 2023-10-13 DIAGNOSIS — M79671 Pain in right foot: Secondary | ICD-10-CM | POA: Diagnosis not present

## 2023-10-13 NOTE — ED Provider Notes (Signed)
 [facility]    CSN: [id number] Arrival date & time: 10/13/23  1330      History   Chief Complaint Chief Complaint  Patient presents with   Foot Injury    HPI [name] is a 78 y.o. female.     Patient presents to [facility] for right foot injury. States she tripped and landed wrong on her foot. Concerned with fracture. Treating pain with tylenol .    Foot Injury   Past Medical History:  Diagnosis Date   Arthritis    Cataracts, bilateral    immature   Chronic back pain    spondylolisthesis   Complication of anesthesia    unable to get up due dizziness,nausea- stayed overnite   Constipation    takes Colace daily   Depression    but not on any meds   History of colon polyps    benign one time and precancerous another   History of colon polyps    History of kidney stones 12/2015   Hyperlipidemia    takes Pravastatin daily   Hypertension    takes Losartan  daily   Hypothyroidism    takes Synthroid  daily   Kidney stones    Numbness    tingling/right lower leg    OA (osteoarthritis)    PONV (postoperative nausea and vomiting)    Shoulder arthritis    Right    Patient Active Problem List   Diagnosis Date Noted   Treatment declined by patient 09/14/2022   Follow-up examination after lung surgery 08/13/2022   Abnormal finding on MRI of brain 07/02/2022   Lung cancer (HCC) 06/19/2022   Nonsmoker 06/19/2022   Subjective memory complaints 09/06/2020   S/p reverse total shoulder arthroplasty 06/13/2017   Status post total shoulder arthroplasty 04/18/2017   S/P lumbar spinal fusion 07/19/2016   S/P shoulder replacement 01/19/2016    Past Surgical History:  Procedure Laterality Date   ABDOMINAL HYSTERECTOMY  2012   bladder tack   BACK SURGERY     BRONCHOSCOPY     COLONOSCOPY  2014   Polyps   LUNG BIOPSY     MAXIMUM ACCESS (MAS)POSTERIOR LUMBAR INTERBODY FUSION (PLIF) 1 LEVEL N/A 07/19/2016   Procedure: LUMBAR FOUR-FIVE MAXIMUM ACCESS (MAS)  POSTERIOR LUMBAR INTERBODY FUSION (PLIF), INSTRUMENTED LUMBAR THREE-FIVE;  Surgeon: Alm GORMAN Molt, MD;  Location: [facility] OR;  Service: Neurosurgery;  Laterality: N/A;   REVERSE SHOULDER ARTHROPLASTY Right 06/13/2017   Procedure: Conversion of right total shoulder arthroplasty to reverse shoulder arthroplasty;  Surgeon: Melita Drivers, MD;  Location: [facility] OR;  Service: Orthopedics;  Laterality: Right;   SHOULDER SURGERY Left 2004   arthroscopy   SHOULDER SURGERY Right 06/13/2017    Conversion of right total shoulder arthroplasty to reverse shoulder arthroplasty (Right Shoulder)   TOTAL SHOULDER ARTHROPLASTY Left 01/19/2016   Procedure: TOTAL SHOULDER ARTHROPLASTY;  Surgeon: Drivers Melita, MD;  Location: [facility] OR;  Service: Orthopedics;  Laterality: Left;   TOTAL SHOULDER ARTHROPLASTY Right 04/18/2017   Procedure: RIGHT TOTAL SHOULDER ARTHROPLASTY;  Surgeon: Melita Drivers, MD;  Location: [facility] OR;  Service: Orthopedics;  Laterality: Right;   TUBAL LIGATION  02/1983    OB History   No obstetric history on file.      Home Medications    Prior to Admission medications   Medication Sig Start Date End Date Taking? Authorizing Provider  acetaminophen  (TYLENOL ) 650 MG CR tablet Take 650-1,300 mg by mouth as needed for pain.    [provider]  Calcium-Vitamin D-Vitamin K  650-12.5-40 MG-MCG-MCG CHEW Chew by mouth. 11/22/15   [provider]  cholecalciferol (VITAMIN D3) 25 MCG (1000 UNIT) tablet Take 1,000 Units by mouth daily.    [provider]  COLLAGEN PO Take by mouth. Takes daily    [provider]  Cyanocobalamin  (B-12 PO) Take by mouth.    [provider]  diclofenac sodium (VOLTAREN) 1 % GEL Apply 1 application  topically as needed (joint pain).    [provider]  donepezil  (ARICEPT ) 5 MG tablet Take 1 tablet (5 mg total) by mouth in the morning. 11/02/21   Millikan, Megan, NP  L-LYSINE PO Take 0.5 tablets by mouth daily.     [provider]  levothyroxine  (SYNTHROID , LEVOTHROID) 50 MCG tablet Take 50 mcg by mouth daily before breakfast.  08/15/14   [provider]  losartan  (COZAAR ) 25 MG tablet Take 25 mg by mouth once daily 09/10/14   [provider]  magnesium  30 MG tablet Take 30 mg by mouth as needed.    [provider]  Melatonin 10 MG TABS Take by mouth as needed.    [provider]  meloxicam (MOBIC) 15 MG tablet Take 1 tablet by mouth daily. 08/19/20   [provider]  Omega-3 Fatty Acids (FISH OIL) 1200 MG CAPS Take 1,200 mg by mouth every evening.    [provider]  oxyCODONE  (OXY IR/ROXICODONE ) 5 MG immediate release tablet Take by mouth. 07/28/22   [provider]  pravastatin (PRAVACHOL) 40 MG tablet Take 40 mg by mouth at bedtime.     [provider]  QC STOOL SOFTENER 100 MG capsule Take 100 mg by mouth 2 (two) times daily as needed. 07/04/20   [provider]  tamsulosin  (FLOMAX ) 0.4 MG CAPS capsule Take 0.8 mg by mouth daily. 11/27/22   [provider]    Family History Family History  Problem Relation Age of Onset   Lung cancer Mother    Lung cancer Father    Alcoholism Father    Alcoholism Brother    Skin cancer Daughter    Memory loss Half-Brother    Dementia Neg Hx     Social History Social History   Tobacco Use   Smoking status: Never    Passive exposure: Past   Smokeless tobacco: Never  Vaping Use   Vaping status: Never Used  Substance Use Topics   Alcohol use: Yes    Alcohol/week: 0.0 standard drinks of alcohol    Comment: rarely   Drug use: No     Allergies   Patient has no known allergies.   Review of Systems Review of Systems   Physical Exam Triage Vital Signs ED Triage Vitals  Encounter Vitals Group     BP 10/13/23 1405 (!) 155/87     Girls Systolic BP Percentile --      Girls Diastolic BP Percentile --      Boys Systolic BP Percentile --      Boys Diastolic BP Percentile --       Pulse Rate 10/13/23 1405 75     Resp 10/13/23 1405 16     Temp 10/13/23 1405 97.8 F (36.6 C)     Temp Source 10/13/23 1405 Oral     SpO2 10/13/23 1405 95 %     Weight --      Height --      Head Circumference --      Peak Flow --      Pain  Score 10/13/23 1403 0     Pain Loc --      Pain Education --      Exclude from Growth Chart --    No data found.  Updated Vital Signs BP (!) 155/87 (BP Location: Right Arm)   Pulse 75   Temp 97.8 F (36.6 C) (Oral)   Resp 16   SpO2 95%   Visual Acuity Right Eye Distance:   Left Eye Distance:   Bilateral Distance:    Right Eye Near:   Left Eye Near:    Bilateral Near:     Physical Exam   UC Treatments / Results  Labs (all labs ordered are listed, but only abnormal results are displayed) 09/05/23: CMP (Scanned into Cancer Center Chart):   Normal CMP with eGFR: 66 ml/min/1.61m^2  EKG   Radiology DG Foot Complete Right Result Date: 10/13/2023 CLINICAL DATA:  Fall EXAM: RIGHT FOOT COMPLETE - 3+ VIEW COMPARISON:  None Available. FINDINGS: No fracture or dislocation of mid foot or forefoot. The phalanges are normal. The calcaneus is normal. No soft tissue abnormality. IMPRESSION: No fracture or dislocation. Electronically Signed   By: Jackquline Boxer M.D.   On: 10/13/2023 14:40    Procedures Procedures (including critical care time)  Medications Ordered in UC Medications - No data to display  Initial Impression / Assessment and Plan / UC Course  I have reviewed the triage vital signs and the nursing notes.  Pertinent labs & imaging results that were available during my care of the patient were reviewed by me and considered in my medical decision making (see chart for details).  Plan of Care: Right foot pain: X-rays were negative.  Use Ace bandage.  Ice and elevation.  Use OTC acetaminophen  or ibuprofen as needed for pain or swelling.    Follow-up if symptoms do not improve, worsen or new symptoms occur.  I reviewed the  plan of care with the patient and/or the patient's guardian.  The patient and/or guardian had time to ask questions and acknowledged that the questions were answered.  I provided instruction on symptoms or reasons to return here or to go to an ER, if symptoms/condition did not improve, worsened or if new symptoms occurred.  Final Clinical Impressions(s) / UC Diagnoses   Final diagnoses:  Right foot pain     Discharge Instructions      Right foot pain: X-rays are negative.  Patient has basically sprained or turned her midfoot.  Continue with the Ace bandage that she has on from home.  Elevate foot and use ice packs, 30 minutes on and 30 minutes off and repeat as needed for 1 to 2 days.  May use over-the-counter ibuprofen or acetaminophen .  Follow-up if pain does not completely resolve in 1 to 2 weeks.     ED Prescriptions   None    PDMP not reviewed this encounter.   Ival Domino, FNP 10/13/23 1510

## 2023-10-13 NOTE — ED Triage Notes (Signed)
 Patient presents to [facility] for right foot injury. States she tripped and landed wrong on her foot. Concerned with fracture. Treating pain with tylenol .

## 2023-10-13 NOTE — Discharge Instructions (Addendum)
 Right foot pain: X-rays are negative.  Patient has basically sprained or turned her midfoot.  Continue with the Ace bandage that she has on from home.  Elevate foot and use ice packs, 30 minutes on and 30 minutes off and repeat as needed for 1 to 2 days.  May use over-the-counter ibuprofen or acetaminophen .  Follow-up if pain does not completely resolve in 1 to 2 weeks.

## 2023-10-22 DIAGNOSIS — R319 Hematuria, unspecified: Secondary | ICD-10-CM | POA: Diagnosis not present

## 2023-10-22 DIAGNOSIS — Z87442 Personal history of urinary calculi: Secondary | ICD-10-CM | POA: Diagnosis not present

## 2023-11-05 ENCOUNTER — Telehealth: Payer: Self-pay | Admitting: Adult Health

## 2023-11-05 ENCOUNTER — Ambulatory Visit: Payer: Medicare Other | Admitting: Adult Health

## 2023-11-05 ENCOUNTER — Encounter: Payer: Self-pay | Admitting: Adult Health

## 2023-11-05 VITALS — BP 133/80 | HR 84 | Ht 61.0 in | Wt 187.2 lb

## 2023-11-05 DIAGNOSIS — G3184 Mild cognitive impairment, so stated: Secondary | ICD-10-CM | POA: Diagnosis not present

## 2023-11-05 NOTE — Progress Notes (Signed)
 PATIENT: [name] DOB: 03/01/1946  REASON FOR VISIT: follow up HISTORY FROM: patient PRIMARY NEUROLOGIST: Dr. Ines  Chief Complaint  Patient presents with   Follow-up    Pt in 4 with daughter  Pt here for memory decline Pt states short term memory is worse Pt states writes a lot of notes to remember different events Pt states tingling in top right side of head. Daughter states had CT scan last year . Daughter wants to discuss if patient had a stroke . Daughter states some confusion with MD      HISTORY OF PRESENT ILLNESS: Today 11/05/23:  [name] is a 78 y.o. female with a history of mild cognitive impairment. Returns today for follow-up.  Overall she feels that she is doing well.  She does note some changes with her short-term memory her daughter tends to pick up these changes as well.  She continues to live at home alone.  Able to complete all ADLs independently.  She completes all of her household chores.  She manages her own medications appointments and finances.  She has tried Aricept  and Namenda in the past.  At the last visit it was mentioned that she had imaging completed and they were told that she had had a small stroke.  I had advised them that if they bring the imaging (CD) in I would have our neuroradiologist take a look at it.  The daughter did not bring the imaging in.  Per the report that was scanned into epic it notated a vascular enhancement but suggested repeating in 2 to 3 months because it could not exclude intracranial metastasis.  This imaging  was ordered by her first oncologist Dr. Mardee.  Family reports that they were told that she had had a small stroke.  She is now being followed by Dr. Ezzard.  I have sent him a message to get clarity.  I was also under the impression at the last visit that she perhaps had imaging that was not in our system.  However after this visit sounds like the only imaging is what we see from February 2024.  Patient does report  over the last year she has had a crawling sensation on the left side of the head above the forehead.  She states it only happens several times a month.  Reports that she is not necessarily bothered by this.  She returns today for an evaluation.   11/05/22: [name] is a 78 y.o. female with a history of Mild cognitive impairment. Returns today for follow-up.  She continues to live at home alone.  Able to complete all ADLs independently.  Continues to manage her own medications and appointments.  She is still very active in several social groups.  This past year she did have a cancerous tumor removed from the right lower lobe.  No chemo or radiation at this time.  Her daughter reports that they were told on imaging she had had a small stroke at some point.  I reviewed the scanned MRI report but I do not see mentioned that their was a possible infarct.   11/02/21: [name] is a 78 year old female with a history of Mild Cognitive Impairment. She Returns today for follow-up. She reports that memory is stable. Lives at home alone. Husband passed away last year. Still grieving the loss. Reports that she has a hard time talking about it still. Able to complete all ADLs independently. Manages own mediations and  appointments.  Tries to stay involved in activities.  Considering getting counseling due to her grief.  Remains on Aricept  5 mg in the morning.  She feels that she is tolerating this well.  She does feel that it may have interfered with her sleep initially.    Has DDD has to take tylenol  or aleve  for discomfort. Mainly at night.   05/04/21: [name] is a 78 year old female with a history of mild cognitive impairment.  She returns today for follow-up.  She did have neuropsychological evaluation with Dr. Jackquline.  I have evaluated his notes.  The patient currently lives at home alone.  Her husband passed away in 2023-11-22 and she is going through the grieving process.  She is able to complete all ADLs  independently.  Reports good appetite.  She still prepares meals.  She manages her own medications.  She is currently not on any memory medication.  She returns today for an evaluation.  HISTORY [name] is a 78 y.o. female here as requested by Jefferey Fitch, MD for memory loss.  Past medical history thyroid  disease, high blood pressure, degenerative disc disease, subjective memory loss, osteoarthritis, elevated cholesterol, depression, kidney stones.  I reviewed Dr. Elaina notes, patient complaining of memory issues, memory issues have not resolved, patient states that she is having to use a GPS to get places now, she is lived in [place] for 38 years and now having to use a GPS to find her way, her children have noticed that she is more forgetful, today she had to think about which drawer in her kitchen she needed to go to get something out, worsening for the last 2 to 3 months, before that she was having issues with recalling a person's name if she saw them out of context.     The last year or two she started noticing some memory changes, she is here with her daughter who also provides information. Around Christmas she developed nerve pain and she was taken to urgent care and she had an mri of her back and she had ruptured a disk, She was given Lyrica and after that she had sudden onset of increased memory problems, dramatic to her, she would walk into her kitchen and she wouldn't know where her silverware was, she got lost going into town, she was using GPS to find places she was going to for years, she may have better days then others. She was vacicnated in 2/21 and last booster of covid 10/21. She is a sensitive person, she can be talking about something and tear up, she cries at commercials, she is very emotional. She has had a hx of depression and her christian faith has helped overcome that. Also balance issues. She lives with her husband, her husband is sick has cancer and there is  stress and has been going on for 2 years. Her half brother had some sort of mental problem. Mother and father without dementia. Mom lived to 40s and father to 31s. She is not aware of any dementia.    Reviewed notes, labs and imaging from outside physicians, which showed:   I reviewed MRI of the brain report from [facility] radiology completed at [facility] June 21, 2020: Cerebral volume is normal for age, similar to prior MRI of 02/17/2019, there is minimal multifocal T2 flair hyperintensity within the cerebral white matter which is nonspecific but compatible with chronic small vessel ischemic disease, prominent perivascular space within the left midbrain, no cortical encephalomalacia identified,  no acute infarct, no evidence of intracranial mass, no chronic intracranial blood products, no extra-axial fluid collection, no midline shift, partially empty sella Turcica, expected proximal artery flow voids.  Impression: No evidence of acute intracranial abnormality, no evidence of acute or recent subacute infarction, stable noncontrast MRI appearance of the brain is compared to February 17, 2019, minimum chronic small vessel ischemic changes within the cerebral white matter.   Blood work collected June 15, 2020: CMP normal with BUN 11 and creatinine 0.79, CBC normal, B12 599.  Hemoglobin A1c 5.3.  TSH normal 3.68.  RPR nonreactive.    REVIEW OF SYSTEMS: Out of a complete 14 system review of symptoms, the patient complains only of the following symptoms, and all other reviewed systems are negative.  ALLERGIES: No Known Allergies  HOME MEDICATIONS: Outpatient Medications Prior to Visit  Medication Sig Dispense Refill   acetaminophen  (TYLENOL ) 650 MG CR tablet Take 650-1,300 mg by mouth as needed for pain.     cholecalciferol (VITAMIN D3) 25 MCG (1000 UNIT) tablet Take 1,000 Units by mouth daily.     COLLAGEN PO Take by mouth. Takes daily     diclofenac sodium (VOLTAREN) 1 % GEL Apply 1  application  topically as needed (joint pain).     L-LYSINE PO Take 0.5 tablets by mouth daily.      levothyroxine  (SYNTHROID , LEVOTHROID) 50 MCG tablet Take 50 mcg by mouth daily before breakfast.      losartan  (COZAAR ) 25 MG tablet Take 25 mg by mouth once daily  0   magnesium  30 MG tablet Take 30 mg by mouth as needed.     Melatonin 10 MG TABS Take by mouth as needed.     meloxicam (MOBIC) 15 MG tablet Take 1 tablet by mouth daily.     Omega-3 Fatty Acids (FISH OIL) 1200 MG CAPS Take 1,200 mg by mouth every evening.     pravastatin (PRAVACHOL) 40 MG tablet Take 40 mg by mouth at bedtime.      QC STOOL SOFTENER 100 MG capsule Take 100 mg by mouth 2 (two) times daily as needed. (Patient taking differently: Take 100 mg by mouth daily.)     Calcium-Vitamin D-Vitamin K 650-12.5-40 MG-MCG-MCG CHEW Chew by mouth. (Patient not taking: Reported on 11/05/2023)     Cyanocobalamin  (B-12 PO) Take by mouth. (Patient not taking: Reported on 11/05/2023)     donepezil  (ARICEPT ) 5 MG tablet Take 1 tablet (5 mg total) by mouth in the morning. 90 tablet 3   oxyCODONE  (OXY IR/ROXICODONE ) 5 MG immediate release tablet Take by mouth. (Patient not taking: Reported on 11/05/2023)     tamsulosin  (FLOMAX ) 0.4 MG CAPS capsule Take 0.8 mg by mouth daily.     No facility-administered medications prior to visit.    PAST MEDICAL HISTORY: Past Medical History:  Diagnosis Date   Arthritis    Cataracts, bilateral    immature   Chronic back pain    spondylolisthesis   Complication of anesthesia    unable to get up due dizziness,nausea- stayed overnite   Constipation    takes Colace daily   Depression    but not on any meds   History of colon polyps    benign one time and precancerous another   History of colon polyps    History of kidney stones 12/2015   Hyperlipidemia    takes Pravastatin daily   Hypertension    takes Losartan  daily   Hypothyroidism    takes Synthroid  daily  Kidney stones    Numbness     tingling/right lower leg    OA (osteoarthritis)    PONV (postoperative nausea and vomiting)    Shoulder arthritis    Right    PAST SURGICAL HISTORY: Past Surgical History:  Procedure Laterality Date   ABDOMINAL HYSTERECTOMY  2012   bladder tack   BACK SURGERY     BRONCHOSCOPY     COLONOSCOPY  2014   Polyps   LUNG BIOPSY     MAXIMUM ACCESS (MAS)POSTERIOR LUMBAR INTERBODY FUSION (PLIF) 1 LEVEL N/A 07/19/2016   Procedure: LUMBAR FOUR-FIVE MAXIMUM ACCESS (MAS) POSTERIOR LUMBAR INTERBODY FUSION (PLIF), INSTRUMENTED LUMBAR THREE-FIVE;  Surgeon: Alm GORMAN Molt, MD;  Location: [facility] OR;  Service: Neurosurgery;  Laterality: N/A;   REVERSE SHOULDER ARTHROPLASTY Right 06/13/2017   Procedure: Conversion of right total shoulder arthroplasty to reverse shoulder arthroplasty;  Surgeon: Melita Drivers, MD;  Location: [facility] OR;  Service: Orthopedics;  Laterality: Right;   SHOULDER SURGERY Left 2004   arthroscopy   SHOULDER SURGERY Right 06/13/2017    Conversion of right total shoulder arthroplasty to reverse shoulder arthroplasty (Right Shoulder)   TOTAL SHOULDER ARTHROPLASTY Left 01/19/2016   Procedure: TOTAL SHOULDER ARTHROPLASTY;  Surgeon: Drivers Melita, MD;  Location: [facility] OR;  Service: Orthopedics;  Laterality: Left;   TOTAL SHOULDER ARTHROPLASTY Right 04/18/2017   Procedure: RIGHT TOTAL SHOULDER ARTHROPLASTY;  Surgeon: Melita Drivers, MD;  Location: [facility] OR;  Service: Orthopedics;  Laterality: Right;   TUBAL LIGATION  02/1983    FAMILY HISTORY: Family History  Problem Relation Age of Onset   Lung cancer Mother    Lung cancer Father    Alcoholism Father    Alcoholism Brother    Skin cancer Daughter    Memory loss Half-Brother    Dementia Neg Hx     SOCIAL HISTORY: Social History   Socioeconomic History   Marital status: Widowed    Spouse name: Not on file   Number of children: Not on file   Years of education: Not on file   Highest education level: Not on file  Occupational History   Not  on file  Tobacco Use   Smoking status: Never    Passive exposure: Past   Smokeless tobacco: Never  Vaping Use   Vaping status: Never Used  Substance and Sexual Activity   Alcohol use: Yes    Alcohol/week: 0.0 standard drinks of alcohol    Comment: rarely   Drug use: No   Sexual activity: Not on file  Other Topics Concern   Not on file  Social History Narrative   Pt lives alone    Retired    Social Drivers of Corporate investment banker Strain: Not on file  Food Insecurity: No Food Insecurity (06/19/2022)   Hunger Vital Sign    Worried About Running Out of Food in the Last Year: Never true    Ran Out of Food in the Last Year: Never true  Transportation Needs: No Transportation Needs (06/19/2022)   PRAPARE - Administrator, Civil Service (Medical): No    Lack of Transportation (Non-Medical): No  Physical Activity: Not on file  Stress: No Stress Concern Present (05/09/2022)   Received from [facility] - Occupational Stress Questionnaire    Feeling of Stress : Only a little  Social Connections: Unknown (09/05/2021)   [facility]   Social Network    Social Network: Not on file  Intimate  Partner Violence: Not At Risk (06/19/2022)   Humiliation, Afraid, Rape, and Kick questionnaire    Fear of Current or Ex-Partner: No    Emotionally Abused: No    Physically Abused: No    Sexually Abused: No      PHYSICAL EXAM  Vitals:   11/05/23 1113  BP: 133/80  Pulse: 84  Weight: 187 lb 3.2 oz (84.9 kg)  Height: 5' 1 (1.549 m)     Body mass index is 35.37 kg/m.     11/05/2023   11:14 AM 11/05/2022   11:06 AM 11/02/2021   10:32 AM  MMSE - Mini Mental State Exam  Orientation to time 5 4 5   Orientation to Place 5 5 5   Registration 3 3 3   Attention/ Calculation 5 5 5   Recall 3 3 3   Language- name 2 objects 2 2 2   Language- repeat 1 1 1   Language- follow 3 step command 3 3 3   Language- read & follow  direction 1 1 1   Write a sentence 1 1 1   Copy design 0 0 1  Total score 29 28 30      Generalized: Tearful when she speaks about her husband  Neurological examination  Mentation: Alert oriented to time, place, history taking. Follows all commands speech and language fluent Cranial nerve II-XII: Pupils were equal round reactive to light. Extraocular movements were full, visual field were full on confrontational test. Facial sensation and strength were normal.  Head turning and shoulder shrug  were normal and symmetric. Motor: The motor testing reveals 5 over 5 strength of all 4 extremities. Good symmetric motor tone is noted throughout.  Sensory: Sensory testing is intact to soft touch on all 4 extremities. No evidence of extinction is noted.  Coordination: Cerebellar testing reveals good finger-nose-finger and heel-to-shin bilaterally.  Gait and station: Gait is normal.    DIAGNOSTIC DATA (LABS, IMAGING, TESTING) - I reviewed patient records, labs, notes, testing and imaging myself where available.  Lab Results  Component Value Date   WBC 10.1 11/26/2022   HGB 14.1 11/26/2022   HCT 42 11/26/2022   MCV 94.7 10/16/2022   PLT 386 11/26/2022      Component Value Date/Time   NA 140 11/26/2022 0000   K 4.3 11/26/2022 0000   CL 106 11/26/2022 0000   CO2 19 11/26/2022 0000   GLUCOSE 116 (H) 10/16/2022 1629   BUN 23 (A) 11/26/2022 0000   CREATININE 1.0 11/26/2022 0000   CREATININE 0.98 10/16/2022 1629   CALCIUM 9.5 11/26/2022 0000   PROT 7.2 10/16/2022 1629   ALBUMIN  4.6 11/26/2022 0000   AST 31 11/26/2022 0000   ALT 14 11/26/2022 0000   ALKPHOS 87 11/26/2022 0000   BILITOT 0.6 10/16/2022 1629   GFRNONAA >60 10/16/2022 1629   GFRAA >60 06/13/2017 0630      ASSESSMENT AND PLAN 78 y.o. year old female  has a past medical history of Arthritis, Cataracts, bilateral, Chronic back pain, Complication of anesthesia, Constipation, Depression, History of colon polyps, History of  colon polyps, History of kidney stones (12/2015), Hyperlipidemia, Hypertension, Hypothyroidism, Kidney stones, Numbness, OA (osteoarthritis), PONV (postoperative nausea and vomiting), and Shoulder arthritis. here with :  1.  Mild cognitive impairment  Memory score is stable MMSE 29/30 She has tried Aricept  and Namenda in the past but could not tolerate. We will request MRI CD from [facility].  I will have one of our neuroradiologist read this.  I have also sent a message to Dr. Ezzard  her new oncologist regarding the imaging to get his point of view. Will repeat neuropsychological testing  Patient does not necessarily want to repeat any imaging unless it is absolutely necessary Follow-up 1 year or sooner if needed.    Duwaine Russell, MSN, NP-C 11/05/2023, 11:15 AM [facility] [address], [address] [place], [place] [address] [phone]  If your symptoms worsen or you develop new symptoms please let us  know.

## 2023-11-05 NOTE — Patient Instructions (Signed)
 Your Plan:  I have asked our medical records team to request CD MRI from [facility] Order sent to repeat neuropsychological testing Of also sent to a message Dr. Ezzard for clarity If your symptoms worsen or you develop new symptoms please let us  know.       Thank you for coming to see us  at [facility]. I hope we have been able to provide you high quality care today.  You may receive a patient satisfaction survey over the next few weeks. We would appreciate your feedback and comments so that we may continue to improve ourselves and the health of our patients.

## 2023-11-05 NOTE — Telephone Encounter (Signed)
 Referral to Neruopsychology faxed to [facility]  [facility] Phone:[phone] Fax: [phone]

## 2023-11-07 NOTE — Telephone Encounter (Signed)
 Pt has called to discuss the referral

## 2023-11-18 ENCOUNTER — Telehealth: Payer: Self-pay | Admitting: Adult Health

## 2023-11-18 NOTE — Telephone Encounter (Signed)
 [name],   Were you able to get the CD of her latest MRI of brain?

## 2023-11-18 NOTE — Telephone Encounter (Signed)
 I called the patient and spoke with her.  I advised that Dr. Vear looked at her MRI from 2024.  There was a small spot ( vascular enhancement) that he saw but he did not feel that this was any significance.  He did not feel that this reflected a stroke or metastasis.  He did not feel any additional workup was needed at this time.  I also tried to call her daughter Devere and left a message.

## 2023-11-25 DIAGNOSIS — G3184 Mild cognitive impairment, so stated: Secondary | ICD-10-CM | POA: Diagnosis not present

## 2023-12-20 DIAGNOSIS — G3184 Mild cognitive impairment, so stated: Secondary | ICD-10-CM | POA: Diagnosis not present

## 2024-01-06 DIAGNOSIS — R413 Other amnesia: Secondary | ICD-10-CM | POA: Diagnosis not present

## 2024-01-29 NOTE — Progress Notes (Unsigned)
 [facility]  [address] [place],  [place]  [address] [phone]  Clinic Day:  01/29/2024  Referring physician: Jefferey Fitch, MD   HISTORY OF PRESENT ILLNESS:  The patient is a 78 y.o. female with stage IB (T2a N0 M0) EGFR+ lung adenocarcinoma, status post a right lower lobectomy in April 2024.  She comes in today for routine follow-up.  Since her last visit, the patient has been doing very well.  She denies having any new respiratory symptoms which concern her for possible disease recurrence.  Of note, a chest CT in May 2025 showed no evidence of disease recurrence.    PHYSICAL EXAM:  There were no vitals taken for this visit. Wt Readings from Last 3 Encounters:  11/05/23 187 lb 3.2 oz (84.9 kg)  09/30/23 189 lb 1.6 oz (85.8 kg)  04/01/23 190 lb 4.8 oz (86.3 kg)   There is no height or weight on file to calculate BMI. Performance status (ECOG): 1 - Symptomatic but completely ambulatory Physical Exam Constitutional:      Appearance: Normal appearance. She is not ill-appearing.  HENT:     Mouth/Throat:     Mouth: Mucous membranes are moist.     Pharynx: Oropharynx is clear. No oropharyngeal exudate or posterior oropharyngeal erythema.  Cardiovascular:     Rate and Rhythm: Normal rate and regular rhythm.     Heart sounds: No murmur heard.    No friction rub. No gallop.  Pulmonary:     Effort: Pulmonary effort is normal. No respiratory distress.     Breath sounds: Normal breath sounds. No wheezing, rhonchi or rales.  Abdominal:     General: Bowel sounds are normal. There is no distension.     Palpations: Abdomen is soft. There is no mass.     Tenderness: There is no abdominal tenderness.  Musculoskeletal:        General: No swelling.     Right lower leg: No edema.     Left lower leg: No edema.  Lymphadenopathy:     Cervical: No cervical adenopathy.     Upper Body:     Right upper body: No supraclavicular or axillary adenopathy.      Left upper body: No supraclavicular or axillary adenopathy.     Lower Body: No right inguinal adenopathy. No left inguinal adenopathy.  Skin:    General: Skin is warm.     Coloration: Skin is not jaundiced.     Findings: No lesion or rash.  Neurological:     General: No focal deficit present.     Mental Status: She is alert and oriented to person, place, and time. Mental status is at baseline.  Psychiatric:        Mood and Affect: Mood normal.        Behavior: Behavior normal.        Thought Content: Thought content normal.   LABS:      Latest Ref Rng & Units 11/26/2022   12:00 AM 10/16/2022    4:29 PM 09/04/2022   12:24 PM  CBC  WBC  10.1     11.2  8.0   Hemoglobin 12.0 - 16.0 14.1     14.0  14.6   Hematocrit 36 - 46 42     43.2  43.1   Platelets 150 - 400 K/uL 386     355  297      This result is from an external source.  Latest Ref Rng & Units 11/26/2022   12:00 AM 10/16/2022    4:29 PM 09/04/2022   12:24 PM  CMP  Glucose 70 - 99 mg/dL  883  899   BUN 4 - 21 23     26  29    Creatinine 0.5 - 1.1 1.0     0.98  0.75   Sodium 137 - 147 140     139  139   Potassium 3.5 - 5.1 mEq/L 4.3     3.8  4.0   Chloride 99 - 108 106     106  106   CO2 13 - 22 19     24  25    Calcium 8.7 - 10.7 9.5     9.6  9.3   Total Protein 6.5 - 8.1 g/dL  7.2  7.5   Total Bilirubin 0.3 - 1.2 mg/dL  0.6  0.6   Alkaline Phos 25 - 125 87     99  95   AST 13 - 35 31     18  14    ALT 7 - 35 U/L 14     14  15       This result is from an external source.   PATHOLOGY:  Results from her right lower lobectomy in April 2024 revealed the following: Diagnosis   A: Lymph node, 4R, excision - One lymph node negative for carcinoma (0/1)   B: Lymph node, 12R, excision - One lymph node negative for carcinoma (0/1)   C: Lymph node, 13R, excision - One lymph node negative for carcinoma (0/1)   D: Lymph node, 8R, excision - One lymph node negative for carcinoma (0/1)   E: Lymph node, 9R, excision -  One lymph node negative for carcinoma (0/1)   F: Lymph node, 10R, excision - One lymph node negative for carcinoma (0/1)   G: Lymph node, 8R #2, excision - One lymph node negative for carcinoma (0/1)   H: Lymph node, level 7, excision - One lymph node negative for carcinoma (0/1)   I: Lymph node, 11R, excision - One lymph node negative for carcinoma (0/1)   J: Lymph node, 10R #2, excision - One lymph node negative for carcinoma (0/1)   K: Lymph node, 12R #2, excision - One lymph node negative for carcinoma (0/1)   L: Right lower lung lobe, lobectomy - Invasive adenocarcinoma, acinar predominant with micropapillary and papillary components (see comment and synoptic report)              - Tumor measures 3.6 cm and abuts but does not invade the visceral pleura              - No lymphovascular space or perineural invasion identified              - Spread through air spaces identified              - Margins negative - Eleven lymph nodes negative for carcinoma (0/11) - Pathologic stage:  pT2a N0   M: Lymph node, 10R #3, excision  - One lymph node negative for carcinoma (0/1)   STUDIES:  Her chest CT from May 2025 revealed the following:     ASSESSMENT & PLAN:  Assessment/Plan:  A 78 y.o. female with stage IB (T2a N0 M0) EGFR+ lung adenocarcinoma, status post a right lower lobectomy in April 2024.  In clinic today, I went over all of her images with her from her recent chest CT, for  which she could see that she remains cancer free.  Clinically, patient continues to do very well.  Moving forward, I will begin alternating studies to where she will receive a chest x-ray at her next visit.  For the visit after that, she will receive a chest CT.  I will see her back in 4 months, with a chest x-ray being done on the day of her next visit for her continued radiographic lung cancer surveillance.  The patient understands all the plans discussed today and is in agreement with them.    [name]  Julia Kerns, MD

## 2024-01-30 ENCOUNTER — Ambulatory Visit (HOSPITAL_BASED_OUTPATIENT_CLINIC_OR_DEPARTMENT_OTHER)
Admission: RE | Admit: 2024-01-30 | Discharge: 2024-01-30 | Disposition: A | Source: Ambulatory Visit | Attending: Oncology | Admitting: Oncology

## 2024-01-30 ENCOUNTER — Inpatient Hospital Stay: Admitting: Oncology

## 2024-01-30 ENCOUNTER — Other Ambulatory Visit: Payer: Self-pay

## 2024-01-30 ENCOUNTER — Inpatient Hospital Stay: Attending: Oncology

## 2024-01-30 ENCOUNTER — Other Ambulatory Visit: Payer: Self-pay | Admitting: Oncology

## 2024-01-30 VITALS — BP 129/76 | HR 87 | Temp 97.7°F | Resp 18 | Ht 61.0 in | Wt 186.6 lb

## 2024-01-30 DIAGNOSIS — Z85118 Personal history of other malignant neoplasm of bronchus and lung: Secondary | ICD-10-CM | POA: Diagnosis not present

## 2024-01-30 DIAGNOSIS — Z96612 Presence of left artificial shoulder joint: Secondary | ICD-10-CM | POA: Diagnosis not present

## 2024-01-30 DIAGNOSIS — C3490 Malignant neoplasm of unspecified part of unspecified bronchus or lung: Secondary | ICD-10-CM | POA: Diagnosis not present

## 2024-01-30 DIAGNOSIS — Z96611 Presence of right artificial shoulder joint: Secondary | ICD-10-CM | POA: Diagnosis not present

## 2024-01-30 DIAGNOSIS — C3411 Malignant neoplasm of upper lobe, right bronchus or lung: Secondary | ICD-10-CM

## 2024-01-30 DIAGNOSIS — C3431 Malignant neoplasm of lower lobe, right bronchus or lung: Secondary | ICD-10-CM | POA: Diagnosis not present

## 2024-01-30 DIAGNOSIS — Z902 Acquired absence of lung [part of]: Secondary | ICD-10-CM | POA: Diagnosis not present

## 2024-01-30 LAB — CBC WITH DIFFERENTIAL (CANCER CENTER ONLY)
Abs Immature Granulocytes: 0.05 K/uL (ref 0.00–0.07)
Basophils Absolute: 0.1 K/uL (ref 0.0–0.1)
Basophils Relative: 1 %
Eosinophils Absolute: 0.2 K/uL (ref 0.0–0.5)
Eosinophils Relative: 2 %
HCT: 43.7 % (ref 36.0–46.0)
Hemoglobin: 14.6 g/dL (ref 12.0–15.0)
Immature Granulocytes: 1 %
Lymphocytes Relative: 27 %
Lymphs Abs: 2.7 K/uL (ref 0.7–4.0)
MCH: 30.9 pg (ref 26.0–34.0)
MCHC: 33.4 g/dL (ref 30.0–36.0)
MCV: 92.6 fL (ref 80.0–100.0)
Monocytes Absolute: 0.9 K/uL (ref 0.1–1.0)
Monocytes Relative: 9 %
Neutro Abs: 6.2 K/uL (ref 1.7–7.7)
Neutrophils Relative %: 60 %
Platelet Count: 339 K/uL (ref 150–400)
RBC: 4.72 MIL/uL (ref 3.87–5.11)
RDW: 13 % (ref 11.5–15.5)
WBC Count: 10.1 K/uL (ref 4.0–10.5)
nRBC: 0 % (ref 0.0–0.2)

## 2024-01-30 LAB — CMP (CANCER CENTER ONLY)
ALT: 10 U/L (ref 0–44)
AST: 17 U/L (ref 15–41)
Albumin: 4.5 g/dL (ref 3.5–5.0)
Alkaline Phosphatase: 113 U/L (ref 38–126)
Anion gap: 12 (ref 5–15)
BUN: 26 mg/dL — ABNORMAL HIGH (ref 8–23)
CO2: 23 mmol/L (ref 22–32)
Calcium: 9.6 mg/dL (ref 8.9–10.3)
Chloride: 106 mmol/L (ref 98–111)
Creatinine: 0.85 mg/dL (ref 0.44–1.00)
GFR, Estimated: >60 mL/min (ref >60)
Glucose, Bld: 102 mg/dL — ABNORMAL HIGH (ref 70–99)
Potassium: 3.8 mmol/L (ref 3.5–5.1)
Sodium: 141 mmol/L (ref 135–145)
Total Bilirubin: 0.7 mg/dL (ref 0.0–1.2)
Total Protein: 7.2 g/dL (ref 6.5–8.1)

## 2024-02-17 ENCOUNTER — Encounter (HOSPITAL_BASED_OUTPATIENT_CLINIC_OR_DEPARTMENT_OTHER): Payer: Self-pay

## 2024-07-23 ENCOUNTER — Inpatient Hospital Stay

## 2024-07-23 ENCOUNTER — Other Ambulatory Visit (HOSPITAL_BASED_OUTPATIENT_CLINIC_OR_DEPARTMENT_OTHER): Admitting: Radiology

## 2024-07-29 ENCOUNTER — Other Ambulatory Visit

## 2024-07-30 ENCOUNTER — Ambulatory Visit: Admitting: Oncology
